# Patient Record
Sex: Female | Born: 1945
Health system: Southern US, Community
[De-identification: ages and names within clinical notes are randomized; demographics above are authoritative.]

## PROBLEM LIST (undated history)

## (undated) DIAGNOSIS — Z9289 Personal history of other medical treatment: Secondary | ICD-10-CM

## (undated) DIAGNOSIS — S62102A Fracture of unspecified carpal bone, left wrist, initial encounter for closed fracture: Secondary | ICD-10-CM

## (undated) DIAGNOSIS — J9600 Acute respiratory failure, unspecified whether with hypoxia or hypercapnia: Secondary | ICD-10-CM

## (undated) DIAGNOSIS — S272XXA Traumatic hemopneumothorax, initial encounter: Secondary | ICD-10-CM

## (undated) DIAGNOSIS — S36113A Laceration of liver, unspecified degree, initial encounter: Secondary | ICD-10-CM

## (undated) DIAGNOSIS — F0390 Unspecified dementia without behavioral disturbance: Secondary | ICD-10-CM

## (undated) DIAGNOSIS — N12 Tubulo-interstitial nephritis, not specified as acute or chronic: Secondary | ICD-10-CM

## (undated) DIAGNOSIS — D62 Acute posthemorrhagic anemia: Secondary | ICD-10-CM

## (undated) DIAGNOSIS — S064X9A Epidural hemorrhage with loss of consciousness of unspecified duration, initial encounter: Secondary | ICD-10-CM

## (undated) DIAGNOSIS — S2220XA Unspecified fracture of sternum, initial encounter for closed fracture: Secondary | ICD-10-CM

## (undated) DIAGNOSIS — S12200A Unspecified displaced fracture of third cervical vertebra, initial encounter for closed fracture: Secondary | ICD-10-CM

## (undated) DIAGNOSIS — S2241XA Multiple fractures of ribs, right side, initial encounter for closed fracture: Secondary | ICD-10-CM

## (undated) DIAGNOSIS — E876 Hypokalemia: Secondary | ICD-10-CM

## (undated) DIAGNOSIS — R279 Unspecified lack of coordination: Secondary | ICD-10-CM

## (undated) DIAGNOSIS — I4891 Unspecified atrial fibrillation: Secondary | ICD-10-CM

## (undated) DIAGNOSIS — R41841 Cognitive communication deficit: Secondary | ICD-10-CM

## (undated) DIAGNOSIS — G5790 Unspecified mononeuropathy of unspecified lower limb: Secondary | ICD-10-CM

## (undated) HISTORY — DX: Epidural hemorrhage with loss of consciousness of unspecified duration, initial encounter: S06.4X9A

## (undated) HISTORY — DX: Acute posthemorrhagic anemia: D62

## (undated) HISTORY — DX: Acute respiratory failure, unspecified whether with hypoxia or hypercapnia: J96.00

## (undated) HISTORY — DX: Fracture of unspecified carpal bone, left wrist, initial encounter for closed fracture: S62.102A

## (undated) HISTORY — DX: Hypokalemia: E87.6

## (undated) HISTORY — DX: Unspecified displaced fracture of third cervical vertebra, initial encounter for closed fracture: S12.200A

## (undated) HISTORY — PX: REPLACEMENT TOTAL KNEE: SUR1224

## (undated) HISTORY — DX: Laceration of liver, unspecified degree, initial encounter: S36.113A

## (undated) HISTORY — DX: Personal history of other medical treatment: Z92.89

## (undated) HISTORY — DX: Unspecified mononeuropathy of unspecified lower limb: G57.90

## (undated) HISTORY — PX: CHOLECYSTECTOMY: SHX55

## (undated) HISTORY — DX: Multiple fractures of ribs, right side, initial encounter for closed fracture: S22.41XA

## (undated) HISTORY — DX: Traumatic hemopneumothorax, initial encounter: S27.2XXA

## (undated) HISTORY — DX: Unspecified fracture of sternum, initial encounter for closed fracture: S22.20XA

---

## 1999-08-13 HISTORY — PX: CARDIAC CATHETERIZATION: SHX172

## 1999-11-23 ENCOUNTER — Encounter: Payer: Self-pay | Admitting: Emergency Medicine

## 1999-11-23 ENCOUNTER — Inpatient Hospital Stay (HOSPITAL_COMMUNITY): Admission: EM | Admit: 1999-11-23 | Discharge: 1999-11-26 | Payer: Self-pay | Admitting: Emergency Medicine

## 1999-12-04 ENCOUNTER — Encounter: Payer: Self-pay | Admitting: General Surgery

## 1999-12-04 ENCOUNTER — Encounter (INDEPENDENT_AMBULATORY_CARE_PROVIDER_SITE_OTHER): Payer: Self-pay | Admitting: Specialist

## 1999-12-04 ENCOUNTER — Ambulatory Visit (HOSPITAL_COMMUNITY): Admission: RE | Admit: 1999-12-04 | Discharge: 1999-12-05 | Payer: Self-pay | Admitting: General Surgery

## 2002-05-07 ENCOUNTER — Encounter: Payer: Self-pay | Admitting: Orthopedic Surgery

## 2002-05-07 ENCOUNTER — Encounter: Admission: RE | Admit: 2002-05-07 | Discharge: 2002-05-07 | Payer: Self-pay | Admitting: Orthopedic Surgery

## 2002-08-12 DIAGNOSIS — Z9289 Personal history of other medical treatment: Secondary | ICD-10-CM

## 2002-08-12 HISTORY — DX: Personal history of other medical treatment: Z92.89

## 2004-01-25 ENCOUNTER — Other Ambulatory Visit: Admission: RE | Admit: 2004-01-25 | Discharge: 2004-01-25 | Payer: Self-pay | Admitting: Family Medicine

## 2005-06-04 ENCOUNTER — Ambulatory Visit: Payer: Self-pay | Admitting: Internal Medicine

## 2005-08-12 DIAGNOSIS — Z9289 Personal history of other medical treatment: Secondary | ICD-10-CM

## 2005-08-12 HISTORY — DX: Personal history of other medical treatment: Z92.89

## 2005-08-12 HISTORY — PX: CARDIAC CATHETERIZATION: SHX172

## 2006-07-02 ENCOUNTER — Encounter: Admission: RE | Admit: 2006-07-02 | Discharge: 2006-07-02 | Payer: Self-pay | Admitting: Cardiology

## 2006-07-29 ENCOUNTER — Encounter: Admission: RE | Admit: 2006-07-29 | Discharge: 2006-07-29 | Payer: Self-pay | Admitting: Cardiology

## 2006-08-27 ENCOUNTER — Inpatient Hospital Stay (HOSPITAL_COMMUNITY): Admission: RE | Admit: 2006-08-27 | Discharge: 2006-08-30 | Payer: Self-pay | Admitting: Orthopedic Surgery

## 2007-04-06 ENCOUNTER — Ambulatory Visit: Payer: Self-pay | Admitting: Internal Medicine

## 2007-04-14 ENCOUNTER — Ambulatory Visit: Payer: Self-pay | Admitting: Internal Medicine

## 2007-06-03 ENCOUNTER — Inpatient Hospital Stay (HOSPITAL_COMMUNITY): Admission: RE | Admit: 2007-06-03 | Discharge: 2007-06-06 | Payer: Self-pay | Admitting: Orthopedic Surgery

## 2010-12-25 NOTE — Op Note (Signed)
[name], [name] NO.:  [id number]   MEDICAL RECORD NO.:  [id number]          PATIENT TYPE:  INP   LOCATION:  1613                         FACILITY:  [facility]   PHYSICIAN:  Ollen Gross, M.D.    DATE OF BIRTH:  04/01/1946   DATE OF PROCEDURE:  06/03/2007  DATE OF DISCHARGE:                               OPERATIVE REPORT   PREOPERATIVE DIAGNOSIS:  Osteoarthritis left knee.   POSTOPERATIVE DIAGNOSIS:  Osteoarthritis left knee.   PROCEDURE:  Left total knee arthroplasty.   SURGEON:  Ollen Gross, M.D.   ASSISTANT:  Avel Peace PA-C   ANESTHESIA:  Spinal.   ESTIMATED BLOOD LOSS:  Minimal.   DRAIN:  None.   TOURNIQUET TIME:  37 minutes at 300 mmHg.   COMPLICATIONS:  None.   CONDITION:  Stable to recovery.   BRIEF CLINICAL NOTE:  [name] is a 65 year old female end-stage arthritis  of left knee progressively worsening pain and dysfunction.  She had  successful right total knee arthroplasty presents now for left total  knee arthroplasty.   PROCEDURE IN DETAIL:  After successful administration of spinal  anesthetic, a tourniquet was placed high on the left thigh and left  lower extremity prepped and draped in usual sterile fashion.  Extremities wrapped in Esmarch, knee flexed and tourniquet inflated 300  mmHg.  Midline incision made with 10 blade through subcutaneous tissue  to the level of the extensor mechanism.  A fresh blade is used to make a  medial parapatellar arthrotomy.  Soft tissue of the proximal medial  tibia is subperiosteally elevated to the joint line with the knife and  into the semimembranosus bursa with a Cobb elevator.  Soft tissue  laterally is elevated with attention being paid to avoid the patellar  tendon on tibial tubercle.  Patella subluxed laterally, knee flexed 90  degrees, ACL and PCL removed.  Drill was used to create a starting hole  in the distal femur and canal was thoroughly irrigated.  The 5 degrees  left valgus  alignment guide is placed referencing posterior condyles,  rotations marked and the block pinned to remove 10 mm of the distal  femur.  Distal femoral resection is made with an oscillating saw.  Sizing blocks placed, size 3 is most appropriate.  Rotations marked at  the epicondylar axis.  Size three cutting block placed and the anterior,  posterior chamfer cuts made.   Tibia subluxed forward and menisci removed.  Extramedullary tibial  alignment guide is placed referencing proximally at the medial aspect of  the tibial tubercle distally along the second metatarsal axis tibial  crest.  Blocks pinned to remove 10 mL from nondeficient lateral side.  We had to go down an additional 2 mm to get to the base of the medial  defect.  Tibial resection is made with an oscillating saw.  Size 3 was  most appropriate tibial component and the proximal tibia prepared the  modular drill and keel punch for size 3.  Femoral preparation is  completed with the intercondylar cut.   Size 3 mobile bearing tibial trial,  size 3 posterior stabilized femoral  trial and 10 mm posterior stabilized rotating platform insert trial are  placed.  With a 10 there was a tiny bit of laxity, varus-valgus play  with a slight bit hyperextension.  With the 12/05 which allowed for full  extension we had excellent varus, valgus, anterior-posterior balance  throughout full range of motion.  Patella was everted and thickness  measured to be 23 mm.  Freehand resection taken to 14 mm, 38 template is  placed, lug holes were drilled, trial patella was placed and it tracks  normally.  Osteophytes were removed off the posterior femur with the  trial placed.  All trials removed and the cut bone surfaces are prepared  with pulsatile lavage.  Cement was mixed and once ready for  implantation, the size 3 mobile bearing tibial tray, size 3 posterior  stabilized femur and 38 patella are cemented into place and patella was  held the clamp.   Trial 12.5 inserts placed, knee held in full extension  and all extruded cement removed.  Cement fully hardened, then the  permanent 12.5 mm posterior stabilized rotating platform insert is  placed in the tibial tray.  Wound was copiously irrigated saline  solution and the FloSeal injected on the posterior capsule as well as  the medial and lateral gutters.  Moist sponge is placed and tourniquet  released total time of 37 minutes.  The sponge is held for about two  minutes, removed and then minimal bleeding encountered.  The bleeding  that was encountered stopped with electrocautery.  The extensor  mechanism was then closed with interrupted Vicryl, subcu closed with 2-0  Vicryl, subcuticular running 4-0 Monocryl.  Flexion against gravity was  130 degrees.  The incisions cleaned and dried and Steri-Strips and a  bulky sterile dressing applied.  She is placed into a knee immobilizer,  awakened and transferred to recovery in stable condition.      Ollen Gross, M.D.  Electronically Signed     FA/MEDQ  D:  06/03/2007  T:  06/04/2007  Job:  [id number]

## 2010-12-25 NOTE — H&P (Signed)
NAMESPIRIT, WERNLI NO.:  [id number]   MEDICAL RECORD NO.:  [id number]          PATIENT TYPE:  INP   LOCATION:  0004                         [facility]ACILITY:  [facility]   PHYSICIAN:  Ollen Gross, M.D.    DATE OF BIRTH:  1945/09/15   DATE OF ADMISSION:  06/03/2007  DATE OF DISCHARGE:                              HISTORY & PHYSICAL   CHIEF COMPLAINT:  Left knee pain.   HISTORY OF PRESENT ILLNESS:  The patient is a 65 year old female who has  been seen by Dr. Lequita Halt for ongoing left knee pain.  She has undergone  a right total knee back in January earlier this year and doing well with  that; but, the left knee continues to have pain.  She has known end-  stage arthritis and is at a point where she would like to proceed with  surgery.   ALLERGIES:  PENICILLIN causes a rash.  SURGICAL TAPE sensitivity and  ELECTRODE GEL sensitivity.   CURRENT MEDICATIONS:  Coumadin, Neurontin, atenolol, Cartia XT,  multivitamin, calcium plus D, COQ-10, vitamin C, ibuprofen, hydrocodone.   PAST MEDICAL HISTORY:  1. Bronchitis.  2. Paroxysmal atrial fibrillation.  3. Hiatal hernia.  4. History of renal calculi.  5. Postmenopausal.   PAST SURGICAL HISTORY:  1. Right total knee January 2000.  2. Gallbladder removal in April 2001.   SOCIAL HISTORY:  Married Optician, dispensing, retired Agricultural consultant.  Nonsmoker.  No  alcohol.  No children.  Spouse will be assisting with care after  surgery.   FAMILY HISTORY:  Father with history of hypertension and diabetes.  Maternal grandmother with diabetes.  Mother with lymphoma.   REVIEW OF SYSTEMS:  GENERAL:  No fevers, chills or night sweats.  NEUROLOGIC:  No seizures, syncope or paralysis.  RESPIRATORY:  No  shortness of breath, productive cough or hemoptysis.  CARDIOVASCULAR:  No chest pain, angina or orthopnea.  GI:  No nausea, vomiting, diarrhea  or constipation.  GU:  No dysuria, hematuria or discharge.  MUSCULOSKELETAL:  Left knee.   PHYSICAL  EXAMINATION:  VITAL SIGNS:  Pulse 56, respirations 12, blood  pressure 124/72.  GENERAL:  A 65 year old white female well-nourished, well-developed, in  no acute distress.  Alert, oriented and cooperative.  Excellent  historian.  Hip and thigh obesity noted.  HEENT:  Normocephalic, atraumatic.  Pupils are round and reactive.  Oropharynx clear.  EOMs intact.  NECK:  Supple.  CHEST:  Clear.  HEART:  Regular rate and rhythm with a 2/6 systolic ejection murmur,  best heard over aortic and pulmonic point.  ABDOMEN:  Soft, round, protuberant abdomen.  Bowel sounds present.  RECTAL/BREAST/GENITALIA:  Not done, not pertinent to present illness.  EXTREMITIES:  Left knee marked crepitus; tender more medial.  No  instability.   IMPRESSION:  1. Osteoarthritis of the left knee.  2. History of bronchitis.  3. Repair of __________  for atrial fibrillation.  4. Hiatal hernia.  5. History of renal calculi.  6. Postmenopausal.   PLAN:  The patient will be admitted to [facility] and undergo  a left total  knee replacement arthroplasty.  Surgery will performed by  Dr. Ollen Gross.      Alexzandrew L. Perkins, P.A.C.      Ollen Gross, M.D.  Electronically Signed    ALP/MEDQ  D:  06/02/2007  T:  06/03/2007  Job:  [id number]

## 2010-12-28 NOTE — Discharge Summary (Signed)
[name], [name]                  ACCOUNT NO.:  [id number]   MEDICAL RECORD NO.:  [id number]          PATIENT TYPE:  INP   LOCATION:  1613                         FACILITY:  [facility]   PHYSICIAN:  Ollen Gross, M.D.    DATE OF BIRTH:  1946-01-03   DATE OF ADMISSION:  06/03/2007  DATE OF DISCHARGE:  06/06/2007                               DISCHARGE SUMMARY   ADMITTING DIAGNOSIS:  1. Osteoarthritis, left knee.  2. History of bronchitis.  3. Paroxysmal atrial fibrillation.  4. Hiatal hernia.  5. History of renal calculi.  6. Postmenopausal.   DISCHARGE DIAGNOSIS:  1. Osteoarthritis left knee status post left total knee replacement      arthroplasty.  2. Mild postop hypokalemia improved.  3. History of bronchitis.  4. Paroxysmal atrial fibrillation.  5. Hiatal hernia.  6. History of renal calculi.  7. Postmenopausal.  8. Postop volume overload, improving.  9. Postop asymptomatic hypotension, improved.   PROCEDURE:  June 03, 2007 left total knee.  Surgeon Dr. Lequita Halt,  assistant Avel Peace, PA-C.   CONSULTATIONS:  None.   BRIEF HISTORY:  The patient is a 61-year female with end-stage arthritis  of the left knee and progressive worsening pain and dysfunction,  successful right total knee who now presents for left total knee.   LABORATORY DATA:  Preop CBC showed a hemoglobin of 13.5, hematocrit  39.3, white cell count 6.5, hemoglobin 12.6 postop, drifted down to 12.  Last noted H&H 11.0 and 32.0.  PT and PTT preop 13.9 at 26 respectively.  INR 1.1.  Serial pro times followed.  Last noted PT/INR 24.4 and 2.1.  Chem panel on admission all within normal limits.  Serial BMETs are  followed.  Potassium dropped from 4.2 down to 3.3 back up to 3.90.  Preop UA trace hemoglobin, 0 to 2 red cells, oxalate crystals, rare  epithelials, otherwise negative.  Blood group type O+.   EKG  June 17, 2006 normal sinus rhythm, rate 65. Marland Kitchen   HOSPITAL COURSE:  The patient admitted to  [facility],  tolerated procedure  well, later transferred to the recovery room on the  orthopedic floor and start on PCA and p.o. analgesics and standard  postop IV antibiotics. Therapy was started. Weightbearing as tolerated.  Had a fair amount of pain on the evening of surgery and the morning of  day one, doing a little bit better the next day.  Weaned over to p.o.  meds. Did have paroxysmal atrial fibrillation but her rate was  controlled on the atenolol, rate was in the 60s. Cardia blood pressure  matter was resumed per parameters. Hemoglobin looked good. A little bit  of elevated serum glucose but it was felt to be due to the dextrose in  the fluids.  She had a little bit of asymptomatic hypotension, systolic  was down in the 90s postop and was felt to be due to narcotics, so again  we held her blood pressure medications, gave her some gentle fluids.  Blood pressure was still  a little low the next day. She was noted  to  have positive volume overload from the surgical fluids and the fluids  for the asymptomatic hypotension. A little bit of diuresis and her  output improved.  She had a little bit of low potassium on day two, felt  to be delusional component, so we gave her some oral supplements. I  discontinued the PCA, dressing was changed, incision looked good.  She  started feeling better, got up and did a little bit more with therapy.  By day three she was doing better, her blood pressure had resolved.  She  was back up, her output was good, tolerating her meds and discharged  home.   DISCHARGE/PLAN:  1. The patient was discharged home on June 06, 2007.  2. Discharge diagnoses, please see above.  3. Discharge medications:  Percocet, Robaxin, and Coumadin.  4. Diet as tolerated.  5. Follow-up 2 weeks.   DISPOSITION:  Home.   CONDITION ON DISCHARGE:  Improving.      Alexzandrew L. Perkins, P.A.C.      Ollen Gross, M.D.  Electronically Signed     ALP/MEDQ  D:  07/21/2007  T:  07/21/2007  Job:  [id number]

## 2010-12-28 NOTE — H&P (Signed)
[name], [name] NO.:  [id number]   MEDICAL RECORD NO.:  [id number]          PATIENT TYPE:  INP   LOCATION:  NA                           FACILITY:  [facility]   PHYSICIAN:  Ollen Gross, M.D.    DATE OF BIRTH:  10/16/1945   DATE OF ADMISSION:  08/27/2006  DATE OF DISCHARGE:                              HISTORY & PHYSICAL   DATE OF OFFICE VISIT/HISTORY AND PHYSICAL:  August 15, 2006.   DATE OF ADMISSION:  August 27, 2006.   CHIEF COMPLAINT:  Right knee pain.   HISTORY OF PRESENT ILLNESS:  Patient is a 65 year old female who has  been seen by Dr. Despina Hick for progressively worsening problems concerning  the right knee.  She has had known end-stage arthritis and has been  treated conservatively in the past.  However, she has reached a point  where she would like to have something more permanent done.  She has  undergone injections in the past which have only provided temporary  relief.  It is felt that the patient would benefit from undergoing  surgical intervention.  She has undergone cardiac workup.  She ended up  having a positive stress test but eventually led to a cardiac  catheterization.  The cardiac catheterization showed normal left  ventricular systolic function and no coronary artery disease.  It  appeared that nuclear study was a false positive.  She has been cleared  by Dr. Clarene Duke and felt to be low risk for up and coming surgery.   ALLERGIES:  PENICILLIN.   CURRENT MEDICATIONS:  Atenolol, Cartia XT 240 mg, Coumadin, Flexeril,  multivitamin, calcium plus D, CoQ 10, vitamin C, ibuprofen, and Vicodin.   PAST MEDICAL HISTORY:  History of bronchitis, paroxysmal atrial  fibrillation, hiatal hernia, renal calculi, postmenopausal.   PAST SURGICAL HISTORY:  Gallbladder surgery, meniscal repair through an  arthroscopy, left hip pinning around age 62 or 16.   SOCIAL HISTORY:  Married.  A retired Optician, dispensing.  Nonsmoker.  No alcohol.  One child.  Spouse  and friends will be assisting with care after  surgery.   FAMILY HISTORY:  Father deceased, history of diabetes and arthritis.  Mother deceased, history of lymphoma.   REVIEW OF SYSTEMS:  GENERAL:  No fevers, chills, night sweats.  NEURO:  No seizures, syncope, paralysis.  RESPIRATORY:  No shortness of breath,  productive cough, or hemoptysis.  CARDIOVASCULAR:  No chest pain,  angina, or orthopnea.  GI:  No nausea, vomiting, diarrhea, or  constipation.  GU:  No dysuria, hematuria, or discharge.  MUSCULOSKELETAL:  Right knee.   PHYSICAL EXAMINATION:  VITAL SIGNS:  Pulse 56, respirations 12, blood  pressure 118/78.  GENERAL:  A 65 year old white female, well-developed, well-nourished,  overweight.  Some hip and thigh obesity.  No acute distress.  She is  somewhat anxious at the time of exam.  HEENT:  Normocephalic and atraumatic.  Pupils are round and reactive.  Oropharynx is clear.  EOMs intact.  CHEST:  Clear anterior and posterior chest wall.  HEART:  Bradycardic rhythm with a pulse of 56, otherwise  regular rhythm,  split S1, normal S2.  No rubs, thrills, palpitations, or murmurs.  ABDOMEN:  Soft, round, protuberant.  Bowel sounds present.  RECTAL/BREASTS/GENITALIA:  Not done.  Not pertinent to the present  illness.  EXTREMITIES:  Right knee shows a range of motion of 0-115 degrees.  Marked crepitus is noted.  No instability.  Left knee:  Range of motion  of 0-125 degrees.  Marked crepitus is noted.  No instability.   IMPRESSION:  1. Osteoarthritis, bilateral knees, right greater than left.  2. History of bronchitis.  3. Paroxysmal atrial fibrillation.  4. Hiatal hernia.  5. History of renal calculi.  6. Postmenopausal.   PLAN:  Patient will be admitted to [facility] to undergo a  right total knee arthroplasty.  Her cardiologist is Dr. Clarene Duke, who will  be notified of the room number on admission and will be consulted as  needed for assistance with the patient  throughout the hospital course.      Alexzandrew L. Julien Girt, P.A.      Ollen Gross, M.D.  Electronically Signed    ALP/MEDQ  D:  08/26/2006  T:  08/27/2006  Job:  [id number]   cc:   Thereasa Solo. Little, M.D.  Fax: [phone]   Doris Cheadle. Foy Guadalajara, M.D.  Fax: [phone]

## 2010-12-28 NOTE — Discharge Summary (Signed)
[name], [name]                  ACCOUNT NO.:  [id number]   MEDICAL RECORD NO.:  [id number]          PATIENT TYPE:  INP   LOCATION:  1505                         FACILITY:  [facility]   PHYSICIAN:  Ollen Gross, M.D.    DATE OF BIRTH:  10-06-1945   DATE OF ADMISSION:  08/27/2006  DATE OF DISCHARGE:  08/30/2006                               DISCHARGE SUMMARY   ADMISSION DIAGNOSES:  1. Osteoarthritis, bilateral knees, right greater than left.  2. History of bronchitis.  3. Paroxysmal atrial fibrillation.  4. Hiatal hernia.  5. History of renal calculi.  6. Postmenopausal.   DISCHARGE DIAGNOSES:  1. Osteoarthritis, right knee, status post right total knee      arthroplasty.  2. Mild postoperative blood-loss anemia, did not require transfusion.  3. Postoperative hyponatremia.  4. Postoperative hypokalemia.  5. History of bronchitis.  6. Paroxysmal atrial fibrillation.  7. Hiatal hernia.  8. History of renal calculi.  9. Postmenopausal.   PROCEDURE:  On August 27, 2006, right total knee.   SURGEON:  Ollen Gross, M.D.   ASSISTANT:  Alexzandrew L. Perkins, P.A.-C.   TOURNIQUET TIME:  Forty-four minutes.   CONSULTS:  None.   BRIEF HISTORY:  Patient is a 65 year old female with severe end-stage  arthritis of the right knee, greater than left knee, with intractable  pain, extensive nonoperative management, including injection, now  presents for total knee arthroplasty.   LABORATORY DATA:  Preop CBC showed a hemoglobin of 14, hematocrit 43.8.  PT/PTT 25.1 and 43, respectively with an INR of 2.2.  Chem panel:  A  slightly elevated total bili of 1.8.  The remaining chem panel within  normal limits.  Preop UA negative.   Chest CT on July 29, 2006:  Somewhat prominent right pulmonary  artery, which accounts for the abnormality on chest x-ray.  No  pathological findings.  She had a cardiac perfusion stress test.  There  is evidence of mild-to-moderate ischemia in the mid  anterior and apical  anterior regions.  Post stress ejection fraction is 61%.  Intermediate  risk scan.   Echo on March 07, 2003, normal LV size and function.  LA dilated at 4.4  cm.  Mild MR.   Cardiac catheterization report:  Cardiac catheterization on June 17, 2006:  Normal left ventricular systolic function.  No coronary arterial  disease.   Serial CBCs were followed.  Hemoglobin did drop down to 11.7.  Last  noted H&H 10.3 and 29.9.  Serial pro times followed.  Repeat pro time  and INR was 13.3 and 1 preoperatively.  Serial pro times followed.  Last  noted PT/INR on postop 17.8 and 1.4.  Serial BMETs were followed.  Sodium did drop down to 134, back up to 137.  Potassium dropped down to  3.3.  Had stabilized at 3.3 prior to discharge.  Follow-up UA negative.   HOSPITAL COURSE:  Patient was admitted to [facility],  tolerated the procedure well, and was later transferred to the recovery  room and then to the orthopedic floor.  Started on PCA and p.o.  analgesics for pain control following surgery.  Actually doing pretty  well the night of surgery, was fairly comfortable.  PCA was discontinued  the next morning.  Was weaned over to p.o. meds.  Hemoglobin was down to  11.7.  Hemovac drain placed at the time of surgery was discontinued,  pulled without difficulty.  Started with physical therapy.  Getting up  out of bed.  By day #2, she was even doing better, starting to get up  and walk with physical therapy short distances.  Dressing was changed.  The incision looked good.  She had a little bit of drop in her potassium  to 3.3.  She was placed on potassium supplements.  Continue to progress  well.  By the following day of August 30, 2006, she was tolerating her  meds, progressing with physical therapy, no complaints.  Was discharged  home.   DISCHARGE PLAN:  1. Patient was discharged home on August 30, 2006.  2. Discharge diagnoses:  Please see above.  3.  Discharge meds:  Coumadin, Percocet, Robaxin.  She was also given      Lovenox for a couple of days since her INR was slowly coming up.  4. Followup:  Two weeks.  5. Activity:  She is weightbearing as tolerate to the right lower      extremity.  Home health PT, home health nursing for gait training,      ambulation, ADLs, total knee protocol.   DISPOSITION:  Home.   CONDITION ON DISCHARGE:  Improving.      Alexzandrew L. Julien Girt, P.A.      Ollen Gross, M.D.  Electronically Signed    ALP/MEDQ  D:  09/25/2006  T:  09/25/2006  Job:  [id number]   cc:   Molly Maduro L. Foy Guadalajara, M.D.  Fax: [phone]   Thereasa Solo. Little, M.D.  Fax: [phone]

## 2010-12-28 NOTE — Discharge Summary (Signed)
[place]. [facility]  Patient:    [name], [name]                           MRN: [id number] Adm. Date:  [id number] Disc. Date: 11/26/99 Attending:  Cala Bradford CC:         Stacie Acres. Cliffton Asters, M.D.             Thereasa Solo. Little, M.D.             Timothy E. Earlene Plater, M.D.                           Discharge Summary  DATE OF BIRTH:  1946/03/23  DISCHARGE DIAGNOSES: 1. Chest pain.    a. Negative cardiac catheterization.    b. Probable gallbladder disease.       I. Ultrasound (November 23, 1999):  Small stones, no dilated duct. 2. Mildly elevated cardiac enzymes.    a. Significance doubtful. 3. Tachy arrhythmia.    a. History of supraventricular tachycardia/atrial fibrillation per patient.    b. Question ventricular tachycardia, on monitor.    c. Therapy with beta blocker. 4. Obesity. 5. L5-S1 herniated disk, treated medically. 6. Left hip surgery x 2, age 34. 7. Allergy:  PENICILLIN (rash) and CODEINE.  DISCHARGE MEDICATIONS: 1. Protonix 40 mg q.d. (new). 2. Toprol XL 50 mg 1/2 tablet q.a.m. (new). 3. Vicodin 5/500 1-2 q.4h. p.r.n. pain (new)--dispensed #20, no refills.  *Avoid aspirin or ibuprofen-like products.  CONDITION ON DISCHARGE:  Stable.  RECOMMENDED ACTIVITY:  No strenuous activity for 48 hours.  RECOMMENDED DIET:  Low fat.  SPECIAL INSTRUCTIONS: 1. Contact Dr. Clarene Duke if persistent palpitations, fainting spells, or problems    with the catheterization site. 2. Return to the emergency room if another episode of severe pain occurs.  FOLLOW-UP: 1. Dr. Kendrick Ranch, surgeon, on Friday, November 30, 1999, at 12:45.  Outpatient    evaluation for cholecystectomy. 2. Dr. Laurann Montana on Monday, Dec 24, 1999 at 9 oclock.  Assume primary    care responsibilities. 3. Contact Dr. Julieanne Manson, cardiologist, to schedule a follow-up in    approximately two weeks.  CONSULTATIONS:  Dr. Julieanne Manson.  PROCEDURES: 1. EKG (November 23, 1999):   Normal. 2. Chest x-ray (November 23, 1999):  Heart size and vascularity normal.  Lungs    clear.  Degenerative spurs in the thoracic spine. 3. Abdominal ultrasound (November 23, 1999):  Multiple small stones in the    gallbladder.  Gallbladder wall is not thickened nor distended.  The common    bile duct is 5.5 mm.  The liver parenchyma, pancreas, spleen, and kidneys    appear normal. 4. Cardiac catheterization (November 26, 1999):  No coronary artery disease.    Normal systolic function.  EF 70%.  HOSPITAL COURSE: #1 - CHEST PAIN:  Mrs. [name] is a 65 year old heavy-set female nonsmoker who presents with acute onset of substernal squeezing chest pain radiating to her left arm.  She had associated shortness of breath and nausea.  Two weeks prior, she had an episode of severe midepigastric pain after eating a large meal.  She has a prior history of atrial fibrillation (two years ago), but never followed through with a cardiac evaluation.  She is admitted with an initial troponin-I elevated at 0.12 and a total CK of 104 with an MB fraction of 6.1 (5.9%).  She was started on  heparin and nitroglycerin.  Serial cardiac enzymes demonstrated further increasing in these levels.  Her troponin-I peaked at 0.34 and came down to 0.29.  Her total CK did not change much and her relative index went to 6.5% and then 5.3%.  Dr. Julieanne Manson was consulted, given the patients presentation and positive cardiac enzymes.  He performed a cardiac catheterization, which demonstrated no coronary artery disease.  He felt that there was no coronary disease to explain her pain and felt that it was probably from gallbladder disease.  He doubted the significance of the low-positive troponin-Is and CK-MBs.  Her EKG remained normal.  She did have what seemed to appear as nonsustained ventricular tachycardia on the monitor and for this reason Dr. Clarene Duke placed her on a beta blocker.  She does have a history of palpitations and  atrial fibrillation per her report.  The LV function was normal.  She will follow up with Dr. Clarene Duke in approximately two weeks.  #2 - GALLSTONES:  Given the normal cardiac catheterization, it is felt that [name] chest pain is likely due to gallbladder disease.  She does have small stones documented by ultrasound this admission.  She had no evidence of enzyme elevations with laboratory work nor did she have a dilated duct or a thickened or distended gallbladder.  We opted to refer her to Dr. Kendrick Ranch as an outpatient for consideration for laparoscopic cholecystectomy.  I did also opt to treat the patient with a proton pump inhibitor, given the fact that her symptoms could also be related to reflux.  She is instructed to return if she has another attack.  I did give her some Vicodin to use at home should she need it.  Dr. Clarene Duke felt that she was a low cardiac risk for gallbladder surgery and would be available if needed.  #3 - OBESITY:  Mrs. [name] weighs 256 pounds.  Dr. Clarene Duke encouraged exercise and diet to help with weight control.  She will follow up with Dr. Cliffton Asters as well and hopefully begin a regular exercise program.  #4 - TACHY ARRHYTHMIA:  See chest pain above.  #5 - LOW-POSITIVE TROPONIN-I AND CK-MB:  See above.  DISCHARGE LABORATORY DATA:  Hemoccult negative.  Total cholesterol 190, triglycerides 177, HDL 40, LDL 115.  Hemoglobin 11.2, WBC 8300, platelet count 214.  ADDENDUM: 1. Mrs. [name] had a normal hemoglobin of 12.1 on admission.  It did drop to    11.2.  She was guaiac negative.  Recommend repeating in Dr. Foye Spurling office    in one month. 2. A TSH was not ordered here in the hospital.  If this has not been done    recently as an outpatient, would recommend obtaining. DD:  11/26/99 TD:  11/26/99 Job: [id number]

## 2010-12-28 NOTE — H&P (Signed)
[place]. [facility]  Patient:    [name], [name]                        MRN: [id number] Proc. Date: 11/23/99 Adm. Date:  11/23/99 Attending:  Stacie Acres. Cliffton Asters, M.D. CC:         [name], M.D.             Thereasa Solo. Little, M.D.                         History and Physical  CHIEF COMPLAINT:  Chest pain.  HISTORY OF PRESENT ILLNESS:  The patient is a 65 year old female, previously healthy, presents with acute substernal squeezing chest pain radiating into her  left arm, onset 1 a.m. last night until 5 a.m.  She did have associated shortness of breath with nausea.  No diaphoresis.  The patient took aspirin and Di-Gel without relief.  Final resolved, post massage.  The patient was at rest when the pain began.  This morning the patient presented to the emergency department and was pain-free upon presentation here and has been pain-free while in the emergency department.  Last threshold check several years ago was normal.  She does ride  bike 15 minutes daily without pain.  She has had orthopnea to the point where she sleeps in the recliner over the past year.  Two weeks ago she had a episode of severe mid epigastric pain after eating a large meal, which lasted approximately four hours.  She has a prior history of atrial fibrillation, did not have any feelings that was happening last night.  PAST MEDICAL HISTORY:  Atrial fibrillation two years ago, never followed through with cardiac evaluation.  PACs.  Obesity.  L5-S1 herniated disk which was treated medically.  PAST SURGICAL HISTORY:  Left hip surgery x 2, age 89. ______ with what sounds like a possible disk location.  First placed a pin, a second removed a pin.  CURRENT MEDICATIONS:  No prescription medications.  She is taking over-the-counter coenzyme Q-10.  Glucosamine, vitamin E, multivitamin, ibuprofen p.r.n.  ALLERGIES:  PENICILLIN leads to a rash and question of an allergy to  CODEINE.  SOCIAL HISTORY:  Married.  Has four cats.  She did have a baby when she was a teenager but was given up for adoption.  She works as a Optician, dispensing at [employer]. Her husband is a Warden/ranger and denies tobacco, alcohol or drug use, currently. Did use some acid in the 1960s.  FAMILY HISTORY:  Diabetes mellitus in father and grandmother.  CHF in her father. Hypertension in her father.  Lymphoma in mother.  REVIEW OF SYSTEMS:  Menstrual cycle has become very irregular.  She has some right foot pain in the second toes, status post a fall down the stairs several weeks go. Uses reading glasses.  Otherwise negative in all system groups.  PHYSICAL EXAMINATION:  VITAL SIGNS:  Temperature 97.6, BP 156/80 to 146/71, pulse 80, respirations 24,  pulse oximetry 96% on room air.  GENERAL:  Well-developed, well-nourished, white female NAD, joking.  HEENT:  Tympanic membranes clear bilaterally.  Oropharynx clear.  Pupils equal,  round, and reactive to light.  NECK:  Supple.  No lymphadenopathy.  No thyromegaly.  CARDIAC:  Regular rate and rhythm without murmur, S3 or S4.  LUNGS:  Clear to auscultation.  BREASTS:  No masses palpated.  No discharge.  ABDOMEN:  Soft, nondistended, nontender.  Positive bowel sounds.  BREASTS/PELVIC:  Examination deferred.  The patient states she will have this done by Dr. Foy Guadalajara.  EXTREMITIES:  No edema, 2+ dorsalis pedis bilaterally.  NEUROLOGICAL:  Cranial nerves 2-12 intact.  Strength 5/5 ______ upper extremities and lower extremities.  Reflexes 2+ and symmetric.  Skin within normal limits.   LABORATORY DATA:  Hemoglobin 12.1, white count 7.7, platelets 253.  PT/PTT normal. Electrolytes normal.  LFT is normal.  CK 104, MB fracture 6.1, troponin I 0.12.   ECG:  Normal sinus rhythm, Q in aVL, otherwise negative.  Chest x-ray:  No acute disease.  ASSESSMENT:  A 65 year old female with cardiac risk factors of age, obesity  who  presents post an episode of acute chest pain with a normal ECG but borderline enzymes, and a story that is very concerning for underlying coronary artery disease.  Differential diagnoses would also include gastroesophageal reflux disease with esophageal spasm.  PLAN:  Admit to the telemetry bed, aspirin, oxygen, p.r.n. nitroglycerin.  Rule out MI with serial CK-MBs, troponin Is.  Consult Dr. Caprice Kluver.  May need a stress test or heart catheterization prior to discharge. DD:  11/23/99 TD:  11/23/99 Job: [id number]

## 2010-12-28 NOTE — Cardiovascular Report (Signed)
[place]. [facility]  Patient:    HILLERY, ZACHMAN                           MRN: [id number] Proc. Date: 11/26/99 Adm. Date:  [id number] Attending:  Cala Bradford CC:         Stacie Acres. Cliffton Asters, M.D.                        Cardiac Catheterization  PROCEDURES: 1. Left heart catheterization. 2. Selective right and left coronary arteriography. 3. Ventriculography in the right anterior oblique projection.  INDICATIONS FOR PROCEDURE:  Ms. [name] is a 65 year old female, who was admitted  through the emergency room with chest pain.  She has had palpitations for many years and had an episode of nonsustained ventricular tachycardia.  In addition o this her troponins were slightly elevated despite having a normal ECG and normal CKs.  As a result of this, the concern was that she may have had a subendocardial myocardial infarction, and because of this she is brought to the cardiac catheterization after obtaining informed consent.  DESCRIPTION OF PROCEDURE:  The patient was prepared and draped in the usual sterile fashion exposing the right groin.  Following local anesthetic with 1% Xylocaine, the Seldinger technique was employed and a 6 [place] introducer sheath was placed in the right femoral artery.  Selective right and left coronary arteriography and ventriculography in the RAO projection was performed.  The 6 French Judkins configuration catheters were used.  RESULTS: 1. Hemodynamic monitoring:  Central aortic pressure was 123/77.  Left    ventricular pressure was 125/19 with no significant aortic valve gradient noted    at the time of pullback. 2. Ventriculography:  Ventriculography in the RAO projection revealed    normal left ventricular systolic function.  Occasional PVCs were noted.    Ejection fraction calculated at 70%.  The end-diastolic pressure was 19.    Mitral regurgitation was only noted following the PVC.  CORONARY ARTERIOGRAPHY:  There was  no calcification noted on fluoroscopy. 1. Left main:  Normal. 2. LAD.  The LAD extended down to the apex of the heart and was somewhat tortuous.    She had a very large first diagonal branch that came off very proximally. The    ostiums were all free of disease.  The distal vessels were free of disease and    there was no coronary disease noted in the LAD diagonal system. 3. Optimal diagonal normal. 3. Circumflex:  The circumflex gave rise to three medium sized marginal vessels.    This entire system was free of disease. 4. Right coronary artery:  The right coronary artery was a dominant vessel    giving rise to the PDA and a small posterolateral branch.  This vessel was    free of disease.  CONCLUSIONS: 1. Normal left ventricular systolic function. 2. No evidence of coronary artery disease.  DISCUSSION:  There is no coronary disease to explain her chest pain and I suspect it is probably all related to her gallbladder disease.  Her low positive troponins with normal CKs, I doubt are of clinical significance in the setting of no occlusive disease.  I have never seen a positive troponin in the setting of gallbladder disease.  Her ECG is also normal.  She has a long history of palpitations and a nonsustained ventricular tachycardia, will be treated with beta blocker.  With normal  left ventricular systolic function, the risk of the nonsustained short bursts of VT is minimal.  At this point I will discharge the patient on Toprol XL 50 mg a half a tablet a  day.  Enteric-coated aspirin.  I have discussed exercise and diet with the patient and from a cardiac standpoint, she can safely undergo gallbladder surgery. DD:  11/26/99 TD:  11/26/99 Job: [id number]

## 2010-12-28 NOTE — Op Note (Signed)
[place]. [facility]  Patient:    [name], [name]                           MRN: [id number] Proc. Date: 12/04/99 Adm. Date:  [id number] Attending:  Carson Myrtle CC:         Thereasa Solo. Little, M.D.                           Operative Report  PREOPERATIVE DIAGNOSIS:  Acute and chronic cholecystolithiasis.  POSTOPERATIVE DIAGNOSIS:  Acute and chronic cholecystolithiasis.  OPERATION PERFORMED:  Laparoscopic cholecystectomy and operative cholangiogram.  SURGEON:  Timothy E. Earlene Plater, M.D.  ASSISTANT:  Donnie Coffin. Samuella Cota, M.D.  ANESTHESIA:  CRNA supervised Dr. Jacklynn Bue.  INDICATIONS FOR PROCEDURE:  [name] is an otherwise healthy 64 year old Caucasian female with risk factors of obesity who developed substernal and right upper quadrant pain last week, was hospitalized, worked up for cardiac disease with catheterization and other exams, no disease was found.  She was thought to have a normal cardiac work-up.  Ultrasound of the gallbladder did reveal multiple small stones.  Her liver function studies were normal.  The pain subsided, she visited me urgently and wished to proceed rather urgently with a laparoscopic cholecystectomy after it had been carefully explained to her in the office.  She is now on a weight reduction diet.  DESCRIPTION OF PROCEDURE:  The patient was brought to the operating room, placed supine.  General endotracheal anesthesia administered.  The abdomen was scrubbed, prepped and draped in the usual fashion.  Marcaine 0.5% with epinephrine was injected at all trocar sites prior to insertion.  An infraumbilical incision was made.  The fascia identified, opened vertically. The abdomen entered without complication.  Hasson catheter placed tied in place and the abdomen insufflated.  General peritoneoscopy was unrevealing. The gallbladder was thickened and contained few adhesions.  A second 10 mm trocar was placed in the midepigastrium.  Two 5  mm trocars in the right upper quadrant.  Appropriate instruments were inserted, the gallbladder was grasped and placed on tension and careful dissection at the base of the gallbladder revealed two rounded structures entering the gallbladder.  These were carefully and completely dissected out.  The anteriormost of these was the cystic artery which was triply clipped and divided.  The more posterior was a single cystic duct which was dissected out and clipped near the gallbladder, a small incision made in the cystic duct, a Riddick catheter inserted percutaneously and inserted into the open cystic duct.  It was clipped in place and then using C-arm fluoroscopy under direct visualization, diluted contrast was injected.  It flowed nice through a convoluted cystic duct into the common bile duct and through the ampulla of Vater into the duodenum without obstruction or abnormality.  Likewise, the dye filled the liver and the radicals were thought to be normal.  Specifically, there was no evidence of stone, leak or peculiar anatomy.  The catheter was removed.  The base of the cystic duct was doubly clipped. The cystic duct was cut and then the gallbladder was removed from the gallbladder bed.  Not abnormalities except a second posterior branch of the cystic artery which was doubly clipped and divided.  The gallbladder removed from the gallbladder bed without complication, incident or spillage. Gallbladder bed carefully cauterized and copiously irrigated and was found to have no bile leak and no bleeding.  The gallbladder was removed through the infraumbilical incision which was then tied snugly.  Irrigation carried out and then all instruments, irrigant, CO2 and trocars removed.  The skin incisions were closed with 3-0 Monocryl.  Sponge, instrument and sharp counts were correct.  The patient tolerated the procedure well, had been given Zofran and Toradol intraoperatively. DD:  12/04/99 TD:   12/04/99 Job: [id number]

## 2010-12-28 NOTE — Op Note (Signed)
[name], [name]                  ACCOUNT NO.:  [id number]   MEDICAL RECORD NO.:  [id number]          PATIENT TYPE:  INP   LOCATION:  NA                           FACILITY:  [facility]   PHYSICIAN:  Ollen Gross, M.D.    DATE OF BIRTH:  1946/06/15   DATE OF PROCEDURE:  08/27/2006  DATE OF DISCHARGE:                               OPERATIVE REPORT   PREOPERATIVE DIAGNOSIS:  Osteoarthritis right knee.   POSTOPERATIVE DIAGNOSIS:  Osteoarthritis right knee.   PROCEDURE:  Right total knee arthroplasty.   SURGEON:  Ollen Gross, M.D.   ASSISTANT:  Alexzandrew L. Julien Girt, P.A.   ANESTHESIA:  Spinal.   BLOOD LOSS:  Minimal.   DRAIN:  Hemovac x1.   TOURNIQUET TIME:  44 minutes at 300 mmHg.   COMPLICATIONS:  None.   CONDITION:  Stable to recovery room.   BRIEF CLINICAL NOTE:  [name] is a 65 year old female with severe end-  stage arthritis right greater left knee with intractable pain.  She has  had extensive nonoperative management including injections and presents  now for total knee arthroplasty.   PROCEDURE IN DETAIL:  After the successful administration of spinal  anesthetic, tourniquet was placed high on the right and right lower  extremity prepped and draped in usual sterile fashion.  Extremity was  wrapped in Esmarch, knee flexed, tourniquet inflated to 350 mmHg.  Midline incision is made with a 10 blade through subcutaneous tissue to  the level of the extensor mechanism.  Fresh blade is used to make a  medial parapatellar arthrotomy.  Soft tissue over the proximal medial  tibia subperiosteally elevated to the joint line with the knife and into  the semimembranosus bursa with a Cobb elevator.  Soft tissue laterally  is elevated with attention being paid to avoid a patellar tendon on  tibial tubercle.  Patella subluxed laterally, knee flexed 90 degrees,  ACL and PCL removed.  Drill was used create a starting hole and the  distal femur canal was thoroughly irrigated.  A 5  degree right valgus  alignment guide is placed and referencing off the posterior condyles  rotations marked and a block pinned to remove 10 mm off the distal  femur.  Distal femoral resection is made with an oscillating saw.  We  then subluxed the tibia anteriorly, removed the menisci and placed the  extramedullary tibial alignment guide.  The alignment was referenced  proximally at the medial aspect of the tibial tubercle and distally  along the second metatarsal axis and tibial crest.  Blocks pinned to  remove 10 mm off of the nondeficient lateral side.  Tibial resection is  made with an oscillating saw.  We then placed the spacer blocks in  extension and 10 mm at a perfect fit.   We again addressed the femur.  The sizing blocks placed and size 3 is  the most appropriate.  Rotations marked off the epicondylar axis of the  size 3 cutting block is placed.  The anterior, posterior and chamfer  cuts are then made.  The tibia is again subluxed forward and  then the  size 3 is the most appropriate tibial component.  The proximal tibia was  prepared with the modular drill and keel punch for a size 3 and then the  femoral preparation is completed with the intercondylar cut.   Size 3 mobile bearing tibial trial, size 3 posterior stabilized femoral  trial and a 10 mm posterior stabilized rotating platform insert trial  are placed.  With the 10, full extension is achieved with excellent  varus and valgus balance throughout full range of motion.  Patella is  then everted and thickness measured to 22 mm.  Freehand resection is  taken to 13 mm, 35 template placed, lug holes are drilled, trial patella  is placed and it tracks normally.  Osteophytes are removed off the  distal femur with the trial placed and all trials are removed.  The cut  bone surfaces are prepared with pulsatile lavage.  Cement is mixed and  once ready for implantation, the size 3 mobile bearing tibial tray, size  3 posterior  stabilized femur and 35 patella are cemented into place and  patella is held with the clamp.  Trial 10 mm insert is placed, knee held  in full extension and all extruded cement removed.  Once cement is fully  hardened, then the permanent 10 mm posterior stabilized rotating  platform insert is placed into the tibial tray.  The wound is then  copiously irrigated with saline solution and the extensor mechanism  closed over a Hemovac drain with interrupted #1 PDS.  Flexion against  gravity is 130 degrees.  Tourniquet is released at a total time of 44  minutes.  Subcu is then closed with interrupted 2-0 Vicryl, subcuticular  running 4-0 Monocryl.  The incision is clean and dry and Steri-Strips  and a bulky sterile dressing applied.  Drain is hooked to suction and  she is placed into a knee immobilizer, awakened and transported to  recovery in stable condition.      Ollen Gross, M.D.  Electronically Signed     FA/MEDQ  D:  08/27/2006  T:  08/27/2006  Job:  [id number]

## 2011-05-22 LAB — BASIC METABOLIC PANEL
BUN: 10
BUN: 10
BUN: 5 — ABNORMAL LOW
CO2: 26
CO2: 27
CO2: 27
Calcium: 8.8
Calcium: 8.8
Calcium: 8.8
Chloride: 106
Chloride: 107
Chloride: 107
Creatinine, Ser: 0.6
Creatinine, Ser: 0.62
Creatinine, Ser: 0.67
GFR calc Af Amer: >60
GFR calc Af Amer: >60
GFR calc Af Amer: >60
GFR calc non Af Amer: >60
GFR calc non Af Amer: >60
GFR calc non Af Amer: >60
Glucose, Bld: 108 — ABNORMAL HIGH
Glucose, Bld: 125 — ABNORMAL HIGH
Glucose, Bld: 169 — ABNORMAL HIGH
Potassium: 3.3 — ABNORMAL LOW
Potassium: 3.7
Potassium: 3.9
Sodium: 138
Sodium: 141
Sodium: 141

## 2011-05-22 LAB — CBC
HCT: 32 — ABNORMAL LOW
HCT: 35.4 — ABNORMAL LOW
HCT: 36.9
Hemoglobin: 11 — ABNORMAL LOW
Hemoglobin: 12
Hemoglobin: 12.6
MCHC: 33.9
MCHC: 34.2
MCHC: 34.5
MCV: 84.4
MCV: 84.6
MCV: 86
Platelets: 157
Platelets: 157
Platelets: 177
RBC: 3.78 — ABNORMAL LOW
RBC: 4.12
RBC: 4.38
RDW: 13.9
RDW: 14
RDW: 14.2 — ABNORMAL HIGH
WBC: 10.4
WBC: 12.3 — ABNORMAL HIGH
WBC: 9.7

## 2011-05-22 LAB — PROTIME-INR
INR: 1
INR: 1.1
INR: 1.3
INR: 2.1 — ABNORMAL HIGH
Prothrombin Time: 13.7
Prothrombin Time: 13.9
Prothrombin Time: 16.5 — ABNORMAL HIGH
Prothrombin Time: 24.4 — ABNORMAL HIGH

## 2011-05-22 LAB — TYPE AND SCREEN
ABO/RH(D): O POS
Antibody Screen: NEGATIVE

## 2011-05-22 LAB — APTT: aPTT: 26

## 2011-05-23 LAB — COMPREHENSIVE METABOLIC PANEL
ALT: 22
AST: 23
Albumin: 4
Alkaline Phosphatase: 85
BUN: 22
CO2: 24
Calcium: 9.3
Chloride: 106
Creatinine, Ser: 0.59
GFR calc Af Amer: >60
GFR calc non Af Amer: >60
Glucose, Bld: 103 — ABNORMAL HIGH
Potassium: 4.2
Sodium: 139
Total Bilirubin: 1.1
Total Protein: 8

## 2011-05-23 LAB — CBC
HCT: 39.3
Hemoglobin: 13.5
MCHC: 34.4
MCV: 84.4
Platelets: 237
RBC: 4.66
RDW: 13.9
WBC: 6.5

## 2011-05-23 LAB — URINALYSIS, ROUTINE W REFLEX MICROSCOPIC
Bilirubin Urine: NEGATIVE
Glucose, UA: NEGATIVE
Ketones, ur: NEGATIVE
Leukocytes, UA: NEGATIVE
Nitrite: NEGATIVE
Protein, ur: NEGATIVE
Specific Gravity, Urine: 1.03
Urobilinogen, UA: 0.2
pH: 5

## 2011-05-23 LAB — URINE MICROSCOPIC-ADD ON

## 2012-11-29 ENCOUNTER — Emergency Department (HOSPITAL_BASED_OUTPATIENT_CLINIC_OR_DEPARTMENT_OTHER): Payer: Medicare Other

## 2012-11-29 ENCOUNTER — Encounter (HOSPITAL_BASED_OUTPATIENT_CLINIC_OR_DEPARTMENT_OTHER): Payer: Self-pay | Admitting: *Deleted

## 2012-11-29 ENCOUNTER — Emergency Department (HOSPITAL_BASED_OUTPATIENT_CLINIC_OR_DEPARTMENT_OTHER)
Admission: EM | Admit: 2012-11-29 | Discharge: 2012-11-29 | Disposition: A | Discharge status: Home / Self Care | Payer: Medicare Other | Attending: Emergency Medicine | Admitting: Emergency Medicine

## 2012-11-29 DIAGNOSIS — Z7901 Long term (current) use of anticoagulants: Secondary | ICD-10-CM | POA: Insufficient documentation

## 2012-11-29 DIAGNOSIS — Y9229 Other specified public building as the place of occurrence of the external cause: Secondary | ICD-10-CM | POA: Insufficient documentation

## 2012-11-29 DIAGNOSIS — Y939 Activity, unspecified: Secondary | ICD-10-CM | POA: Insufficient documentation

## 2012-11-29 DIAGNOSIS — Z79899 Other long term (current) drug therapy: Secondary | ICD-10-CM | POA: Insufficient documentation

## 2012-11-29 DIAGNOSIS — I4891 Unspecified atrial fibrillation: Secondary | ICD-10-CM | POA: Insufficient documentation

## 2012-11-29 DIAGNOSIS — S022XXA Fracture of nasal bones, initial encounter for closed fracture: Secondary | ICD-10-CM | POA: Insufficient documentation

## 2012-11-29 DIAGNOSIS — W010XXA Fall on same level from slipping, tripping and stumbling without subsequent striking against object, initial encounter: Secondary | ICD-10-CM | POA: Insufficient documentation

## 2012-11-29 DIAGNOSIS — Z880 Allergy status to penicillin: Secondary | ICD-10-CM | POA: Insufficient documentation

## 2012-11-29 DIAGNOSIS — W19XXXA Unspecified fall, initial encounter: Secondary | ICD-10-CM

## 2012-11-29 DIAGNOSIS — S3981XA Other specified injuries of abdomen, initial encounter: Secondary | ICD-10-CM | POA: Insufficient documentation

## 2012-11-29 HISTORY — DX: Unspecified atrial fibrillation: I48.91

## 2012-11-29 LAB — PROTIME-INR
INR: 1.65 — ABNORMAL HIGH (ref 0.00–1.49)
Prothrombin Time: 19 seconds — ABNORMAL HIGH (ref 11.6–15.2)

## 2012-11-29 NOTE — ED Notes (Signed)
Pt was at church earlier this a.m. And tripped and fell. On Coumadin. Fell flat on face. Abrasions and swelling noted. Given ice pack. No LOC. PERL

## 2012-11-29 NOTE — ED Provider Notes (Signed)
History     CSN: [id number]  Arrival date & time 11/29/12  1310   First MD Initiated Contact with Patient 11/29/12 1407      Chief Complaint  Patient presents with  . Fall    (Consider location/radiation/quality/duration/timing/severity/associated sxs/prior treatment) HPI Comments: Patient is a 67 year old female with a past medical history of A-fib who presents after a fall that occurred prior to arrival. Patient reports tripping on the sidewalk, falling, and landing on her abdomen and face. Patient reports nose pain and swelling after the fall. She denies LOC. The fall was mechanical. She denies chest pain, dizziness, SOB prior to falling. The pain started suddenly and remained constant since the fall. Palpation makes the pain worse. Nothing makes the pain better. Patient has tried applying ice to the injury. No other associated symptoms. Patient denies any other injury.    Past Medical History  Diagnosis Date  . A-fib     Past Surgical History  Procedure Laterality Date  . Replacement total knee    . Cholecystectomy      History reviewed. No pertinent family history.  History  Substance Use Topics  . Smoking status: Never Smoker   . Smokeless tobacco: Not on file  . Alcohol Use: No    OB History   Grav Para Term Preterm Abortions TAB SAB Ect Mult Living                  Review of Systems  HENT: Positive for facial swelling.   Skin: Positive for wound.  All other systems reviewed and are negative.    Allergies  Penicillins  Home Medications   Current Outpatient Rx  Name  Route  Sig  Dispense  Refill  . atenolol-chlorthalidone (TENORETIC) 50-25 MG per tablet   Oral   Take 1 tablet by mouth daily.         Marland Kitchen diltiazem (TIAZAC) 240 MG 24 hr capsule   Oral   Take 240 mg by mouth daily.         Marland Kitchen warfarin (COUMADIN) 5 MG tablet   Oral   Take 5 mg by mouth daily.           BP 125/77  Pulse 60  Temp(Src) 98 F (36.7 C) (Oral)  Resp 20  Ht  5\' 7"  (1.702 m)  Wt 240 lb (108.863 kg)  BMI 37.58 kg/m2  SpO2 95%  Physical Exam  Nursing note and vitals reviewed. Constitutional: She is oriented to person, place, and time. She appears well-developed and well-nourished. No distress.  HENT:  Head: Normocephalic and atraumatic.  Hematoma noted to left forehead that is tender to palpation. Generalized edema and tenderness to palpation of nasal bridge.  Eyes: Conjunctivae and EOM are normal. Pupils are equal, round, and reactive to light.  Neck: Normal range of motion. Neck supple.  Cardiovascular: Normal rate and regular rhythm.  Exam reveals no gallop and no friction rub.   No murmur heard. Pulmonary/Chest: Effort normal and breath sounds normal. She has no wheezes. She has no rales. She exhibits no tenderness.  Abdominal: Soft. She exhibits no distension. There is no tenderness. There is no rebound and no guarding.  Musculoskeletal: Normal range of motion.  Neurological: She is alert and oriented to person, place, and time. No cranial nerve deficit. Coordination normal.  Speech is goal-oriented. Moves limbs without ataxia.   Skin: Skin is warm and dry.  Abrasions noted to face. No active bleeding. No open wound.  Psychiatric: She has a normal mood and affect. Her behavior is normal.    ED Course  Procedures (including critical care time)  Labs Reviewed  PROTIME-INR - Abnormal; Notable for the following:    Prothrombin Time 19.0 (*)    INR 1.65 (*)    All other components within normal limits   Ct Head Wo Contrast  11/29/2012  *RADIOLOGY REPORT*  Clinical Data:  67 year old female status post fall face down on pavement.  Known injury.  On Coumadin.  Pain.  History of atrial fibrillation.  CT HEAD WITHOUT CONTRAST CT MAXILLOFACIAL WITHOUT CONTRAST CT CERVICAL SPINE WITHOUT CONTRAST  Technique:  Multidetector CT imaging of the head, cervical spine, and maxillofacial structures were performed using the standard protocol without  intravenous contrast. Multiplanar CT image reconstructions of the cervical spine and maxillofacial structures were also generated.  Comparison:   None  CT HEAD  Findings: Face findings are below.  No scalp convexity hematoma.  Possible mild left forehead scalp contusion (series 3 image 29).  Underlying left frontal bone intact.  Calvarium intact.  Mastoids are clear.  Osteopenia.  Calcified atherosclerosis at the skull base.  Cerebral volume is within normal limits for age.  No midline shift, ventriculomegaly, mass effect, evidence of mass lesion, intracranial hemorrhage or evidence of cortically based acute infarction.  Gray-white matter differentiation is within normal limits throughout the brain.  No suspicious intracranial vascular hyperdensity.  IMPRESSION: 1. Small chronic right cerebellar lacunar infarct.  Otherwise negative noncontrast CT appearance of the brain. 2.  See face and cervical spine findings below. 3.  Mild left forehead scalp contusion without underlying fracture.  CT MAXILLOFACIAL  Findings:  Mildly displaced bilateral nasal bone fractures left greater than right.  Prominent associated bridge of nose soft tissue hematoma.  Globes appear intact and orbital soft tissues do not appear affected.  Visualized paranasal sinuses and mastoids are clear.  Mandible intact.  No other acute facial fracture identified.  IMPRESSION: 1.  Left greater than right nasal bone fractures with prominent associated bridge of nose soft tissue hematoma. 2.  No other acute facial fracture. 3.  Cervical spine findings are below.  CT CERVICAL SPINE  Findings:   Visualized skull base is intact.  No atlanto-occipital dissociation.  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  Incidental scalloping of the left lateral C4 vertebral body due to a tortuous dominant left vertebral artery.  2-3 mm of anterolisthesis of C4 on C5 associated with moderate to severe left side facet degeneration.   Chronic C5-C6 and C6-C7 disc space loss and, bulky endplate spurring. Bilateral posterior element alignment is within normal limits. Cervicothoracic junction alignment is within normal limits.  No acute cervical fracture identified.  Negative lung apices.  The partially retropharyngeal course of both carotid arteries.  On these images there is a small chronic lacunar infarct evident in the right cerebellum (series 11 image 5).  This was thought to reflect a sulcus on the above head images.  IMPRESSION: 1. No acute fracture or listhesis identified in the cervical spine. Ligamentous injury is not excluded. 2.  C3-C4 posterior element ankylosis.  Degenerative C4-C5 spondylolisthesis.  Chronic advanced C5-C6 and C6-C7 disc and endplate degeneration.   Original Report Authenticated By: Erskine Speed, M.D.    Ct Cervical Spine Wo Contrast  11/29/2012  *RADIOLOGY REPORT*  Clinical Data:  67 year old female status post fall face down on pavement.  Known injury.  On Coumadin.  Pain.  History of atrial  fibrillation.  CT HEAD WITHOUT CONTRAST CT MAXILLOFACIAL WITHOUT CONTRAST CT CERVICAL SPINE WITHOUT CONTRAST  Technique:  Multidetector CT imaging of the head, cervical spine, and maxillofacial structures were performed using the standard protocol without intravenous contrast. Multiplanar CT image reconstructions of the cervical spine and maxillofacial structures were also generated.  Comparison:   None  CT HEAD  Findings: Face findings are below.  No scalp convexity hematoma.  Possible mild left forehead scalp contusion (series 3 image 29).  Underlying left frontal bone intact.  Calvarium intact.  Mastoids are clear.  Osteopenia.  Calcified atherosclerosis at the skull base.  Cerebral volume is within normal limits for age.  No midline shift, ventriculomegaly, mass effect, evidence of mass lesion, intracranial hemorrhage or evidence of cortically based acute infarction.  Gray-white matter differentiation is within normal  limits throughout the brain.  No suspicious intracranial vascular hyperdensity.  IMPRESSION: 1. Small chronic right cerebellar lacunar infarct.  Otherwise negative noncontrast CT appearance of the brain. 2.  See face and cervical spine findings below. 3.  Mild left forehead scalp contusion without underlying fracture.  CT MAXILLOFACIAL  Findings:  Mildly displaced bilateral nasal bone fractures left greater than right.  Prominent associated bridge of nose soft tissue hematoma.  Globes appear intact and orbital soft tissues do not appear affected.  Visualized paranasal sinuses and mastoids are clear.  Mandible intact.  No other acute facial fracture identified.  IMPRESSION: 1.  Left greater than right nasal bone fractures with prominent associated bridge of nose soft tissue hematoma. 2.  No other acute facial fracture. 3.  Cervical spine findings are below.  CT CERVICAL SPINE  Findings:   Visualized skull base is intact.  No atlanto-occipital dissociation.  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  Incidental scalloping of the left lateral C4 vertebral body due to a tortuous dominant left vertebral artery.  2-3 mm of anterolisthesis of C4 on C5 associated with moderate to severe left side facet degeneration.  Chronic C5-C6 and C6-C7 disc space loss and, bulky endplate spurring. Bilateral posterior element alignment is within normal limits. Cervicothoracic junction alignment is within normal limits.  No acute cervical fracture identified.  Negative lung apices.  The partially retropharyngeal course of both carotid arteries.  On these images there is a small chronic lacunar infarct evident in the right cerebellum (series 11 image 5).  This was thought to reflect a sulcus on the above head images.  IMPRESSION: 1. No acute fracture or listhesis identified in the cervical spine. Ligamentous injury is not excluded. 2.  C3-C4 posterior element ankylosis.  Degenerative C4-C5  spondylolisthesis.  Chronic advanced C5-C6 and C6-C7 disc and endplate degeneration.   Original Report Authenticated By: Erskine Speed, M.D.    Ct Maxillofacial Wo Cm  11/29/2012  *RADIOLOGY REPORT*  Clinical Data:  67 year old female status post fall face down on pavement.  Known injury.  On Coumadin.  Pain.  History of atrial fibrillation.  CT HEAD WITHOUT CONTRAST CT MAXILLOFACIAL WITHOUT CONTRAST CT CERVICAL SPINE WITHOUT CONTRAST  Technique:  Multidetector CT imaging of the head, cervical spine, and maxillofacial structures were performed using the standard protocol without intravenous contrast. Multiplanar CT image reconstructions of the cervical spine and maxillofacial structures were also generated.  Comparison:   None  CT HEAD  Findings: Face findings are below.  No scalp convexity hematoma.  Possible mild left forehead scalp contusion (series 3 image 29).  Underlying left frontal bone intact.  Calvarium intact.  Mastoids  are clear.  Osteopenia.  Calcified atherosclerosis at the skull base.  Cerebral volume is within normal limits for age.  No midline shift, ventriculomegaly, mass effect, evidence of mass lesion, intracranial hemorrhage or evidence of cortically based acute infarction.  Gray-white matter differentiation is within normal limits throughout the brain.  No suspicious intracranial vascular hyperdensity.  IMPRESSION: 1. Small chronic right cerebellar lacunar infarct.  Otherwise negative noncontrast CT appearance of the brain. 2.  See face and cervical spine findings below. 3.  Mild left forehead scalp contusion without underlying fracture.  CT MAXILLOFACIAL  Findings:  Mildly displaced bilateral nasal bone fractures left greater than right.  Prominent associated bridge of nose soft tissue hematoma.  Globes appear intact and orbital soft tissues do not appear affected.  Visualized paranasal sinuses and mastoids are clear.  Mandible intact.  No other acute facial fracture identified.  IMPRESSION:  1.  Left greater than right nasal bone fractures with prominent associated bridge of nose soft tissue hematoma. 2.  No other acute facial fracture. 3.  Cervical spine findings are below.  CT CERVICAL SPINE  Findings:   Visualized skull base is intact.  No atlanto-occipital dissociation.  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  Incidental scalloping of the left lateral C4 vertebral body due to a tortuous dominant left vertebral artery.  2-3 mm of anterolisthesis of C4 on C5 associated with moderate to severe left side facet degeneration.  Chronic C5-C6 and C6-C7 disc space loss and, bulky endplate spurring. Bilateral posterior element alignment is within normal limits. Cervicothoracic junction alignment is within normal limits.  No acute cervical fracture identified.  Negative lung apices.  The partially retropharyngeal course of both carotid arteries.  On these images there is a small chronic lacunar infarct evident in the right cerebellum (series 11 image 5).  This was thought to reflect a sulcus on the above head images.  IMPRESSION: 1. No acute fracture or listhesis identified in the cervical spine. Ligamentous injury is not excluded. 2.  C3-C4 posterior element ankylosis.  Degenerative C4-C5 spondylolisthesis.  Chronic advanced C5-C6 and C6-C7 disc and endplate degeneration.   Original Report Authenticated By: Erskine Speed, M.D.      1. Nasal bone fracture, closed, initial encounter   2. Fall, initial encounter       MDM  4:38 PM CT head, face, and cervical spine show nasal bone fracture. Patient will have follow up with ENT for further evaluation of fracture. Patient instructed to ice her nose. Patient declined pain medication at this time. No other injuries. Patient shows no neuro deficits and is able to converse easily. She is alert and oriented. Patient will follow up with her PCP tomorrow. Patient instructed to return with worsening or concerning symptoms.         Emilia Beck, PA-C 11/29/12 1651

## 2012-11-30 NOTE — ED Provider Notes (Signed)
Medical screening examination/treatment/procedure(s) were performed by non-physician practitioner and as supervising physician I was immediately available for consultation/collaboration.   Carleene Cooper III, MD 11/30/12 [phone]

## 2013-08-01 ENCOUNTER — Emergency Department (HOSPITAL_COMMUNITY): Payer: No Typology Code available for payment source

## 2013-08-01 ENCOUNTER — Encounter (HOSPITAL_COMMUNITY): Payer: Self-pay | Admitting: Emergency Medicine

## 2013-08-01 ENCOUNTER — Inpatient Hospital Stay (HOSPITAL_COMMUNITY)
Admission: EM | Admit: 2013-08-01 | Discharge: 2013-08-16 | DRG: 958 | Disposition: A | Discharge status: Skilled Nursing Facility | Payer: No Typology Code available for payment source | Attending: General Surgery | Admitting: General Surgery

## 2013-08-01 DIAGNOSIS — IMO0002 Reserved for concepts with insufficient information to code with codable children: Secondary | ICD-10-CM | POA: Diagnosis present

## 2013-08-01 DIAGNOSIS — I4891 Unspecified atrial fibrillation: Secondary | ICD-10-CM

## 2013-08-01 DIAGNOSIS — S064X9A Epidural hemorrhage with loss of consciousness of unspecified duration, initial encounter: Secondary | ICD-10-CM

## 2013-08-01 DIAGNOSIS — S42109A Fracture of unspecified part of scapula, unspecified shoulder, initial encounter for closed fracture: Secondary | ICD-10-CM | POA: Diagnosis present

## 2013-08-01 DIAGNOSIS — S300XXA Contusion of lower back and pelvis, initial encounter: Secondary | ICD-10-CM | POA: Diagnosis present

## 2013-08-01 DIAGNOSIS — D696 Thrombocytopenia, unspecified: Secondary | ICD-10-CM | POA: Diagnosis not present

## 2013-08-01 DIAGNOSIS — S62102A Fracture of unspecified carpal bone, left wrist, initial encounter for closed fracture: Secondary | ICD-10-CM

## 2013-08-01 DIAGNOSIS — S2220XA Unspecified fracture of sternum, initial encounter for closed fracture: Secondary | ICD-10-CM

## 2013-08-01 DIAGNOSIS — I480 Paroxysmal atrial fibrillation: Secondary | ICD-10-CM

## 2013-08-01 DIAGNOSIS — S064XAA Epidural hemorrhage with loss of consciousness status unknown, initial encounter: Secondary | ICD-10-CM

## 2013-08-01 DIAGNOSIS — Z96659 Presence of unspecified artificial knee joint: Secondary | ICD-10-CM

## 2013-08-01 DIAGNOSIS — S2249XA Multiple fractures of ribs, unspecified side, initial encounter for closed fracture: Secondary | ICD-10-CM | POA: Diagnosis present

## 2013-08-01 DIAGNOSIS — S12200A Unspecified displaced fracture of third cervical vertebra, initial encounter for closed fracture: Secondary | ICD-10-CM

## 2013-08-01 DIAGNOSIS — S52502A Unspecified fracture of the lower end of left radius, initial encounter for closed fracture: Secondary | ICD-10-CM

## 2013-08-01 DIAGNOSIS — G5790 Unspecified mononeuropathy of unspecified lower limb: Secondary | ICD-10-CM | POA: Diagnosis present

## 2013-08-01 DIAGNOSIS — D62 Acute posthemorrhagic anemia: Secondary | ICD-10-CM | POA: Diagnosis not present

## 2013-08-01 DIAGNOSIS — J939 Pneumothorax, unspecified: Secondary | ICD-10-CM

## 2013-08-01 DIAGNOSIS — S064X0A Epidural hemorrhage without loss of consciousness, initial encounter: Secondary | ICD-10-CM

## 2013-08-01 DIAGNOSIS — E876 Hypokalemia: Secondary | ICD-10-CM | POA: Diagnosis not present

## 2013-08-01 DIAGNOSIS — J4489 Other specified chronic obstructive pulmonary disease: Secondary | ICD-10-CM | POA: Diagnosis present

## 2013-08-01 DIAGNOSIS — S129XXA Fracture of neck, unspecified, initial encounter: Secondary | ICD-10-CM

## 2013-08-01 DIAGNOSIS — Z6841 Body Mass Index (BMI) 40.0 and over, adult: Secondary | ICD-10-CM

## 2013-08-01 DIAGNOSIS — S2231XA Fracture of one rib, right side, initial encounter for closed fracture: Secondary | ICD-10-CM

## 2013-08-01 DIAGNOSIS — S36113A Laceration of liver, unspecified degree, initial encounter: Secondary | ICD-10-CM | POA: Diagnosis present

## 2013-08-01 DIAGNOSIS — S52509A Unspecified fracture of the lower end of unspecified radius, initial encounter for closed fracture: Principal | ICD-10-CM | POA: Diagnosis present

## 2013-08-01 DIAGNOSIS — S272XXA Traumatic hemopneumothorax, initial encounter: Secondary | ICD-10-CM

## 2013-08-01 DIAGNOSIS — Z79899 Other long term (current) drug therapy: Secondary | ICD-10-CM

## 2013-08-01 DIAGNOSIS — S270XXA Traumatic pneumothorax, initial encounter: Secondary | ICD-10-CM

## 2013-08-01 DIAGNOSIS — S2241XA Multiple fractures of ribs, right side, initial encounter for closed fracture: Secondary | ICD-10-CM

## 2013-08-01 DIAGNOSIS — S27329A Contusion of lung, unspecified, initial encounter: Secondary | ICD-10-CM | POA: Diagnosis present

## 2013-08-01 DIAGNOSIS — J9600 Acute respiratory failure, unspecified whether with hypoxia or hypercapnia: Secondary | ICD-10-CM | POA: Diagnosis not present

## 2013-08-01 DIAGNOSIS — J449 Chronic obstructive pulmonary disease, unspecified: Secondary | ICD-10-CM | POA: Diagnosis present

## 2013-08-01 DIAGNOSIS — Z7901 Long term (current) use of anticoagulants: Secondary | ICD-10-CM

## 2013-08-01 LAB — URINALYSIS, ROUTINE W REFLEX MICROSCOPIC
Bilirubin Urine: NEGATIVE
Glucose, UA: NEGATIVE mg/dL
Ketones, ur: NEGATIVE mg/dL
Leukocytes, UA: NEGATIVE
Nitrite: NEGATIVE
Protein, ur: 30 mg/dL — AB
Specific Gravity, Urine: >1.046 — ABNORMAL HIGH (ref 1.005–1.030)
Urobilinogen, UA: 0.2 mg/dL (ref 0.0–1.0)
pH: 5 (ref 5.0–8.0)

## 2013-08-01 LAB — CBC
HCT: 41.6 % (ref 36.0–46.0)
Hemoglobin: 13.7 g/dL (ref 12.0–15.0)
MCH: 29.6 pg (ref 26.0–34.0)
MCHC: 32.9 g/dL (ref 30.0–36.0)
MCV: 89.8 fL (ref 78.0–100.0)
Platelets: 203 10*3/uL (ref 150–400)
RBC: 4.63 MIL/uL (ref 3.87–5.11)
RDW: 14.5 % (ref 11.5–15.5)
WBC: 14.6 10*3/uL — ABNORMAL HIGH (ref 4.0–10.5)

## 2013-08-01 LAB — BASIC METABOLIC PANEL
BUN: 21 mg/dL (ref 6–23)
CO2: 22 mEq/L (ref 19–32)
Calcium: 8.4 mg/dL (ref 8.4–10.5)
Chloride: 106 mEq/L (ref 96–112)
Creatinine, Ser: 0.65 mg/dL (ref 0.50–1.10)
GFR calc Af Amer: >90 mL/min (ref >90)
GFR calc non Af Amer: 90 mL/min — ABNORMAL LOW (ref >90)
Glucose, Bld: 142 mg/dL — ABNORMAL HIGH (ref 70–99)
Potassium: 4.1 mEq/L (ref 3.5–5.1)
Sodium: 140 mEq/L (ref 135–145)

## 2013-08-01 LAB — MRSA PCR SCREENING: MRSA by PCR: NEGATIVE

## 2013-08-01 LAB — URINE MICROSCOPIC-ADD ON

## 2013-08-01 LAB — PROTIME-INR
INR: 2.53 — ABNORMAL HIGH (ref 0.00–1.49)
Prothrombin Time: 26.4 seconds — ABNORMAL HIGH (ref 11.6–15.2)

## 2013-08-01 LAB — TROPONIN I: Troponin I: <0.3 ng/mL (ref <0.30)

## 2013-08-01 LAB — ABO/RH: ABO/RH(D): O POS

## 2013-08-01 LAB — APTT: aPTT: 29 seconds (ref 24–37)

## 2013-08-01 MED ORDER — HYDROMORPHONE 0.3 MG/ML IV SOLN
INTRAVENOUS | Status: DC
  Administered 2013-08-01: 18:00:00 via INTRAVENOUS
  Administered 2013-08-02: 1.99 mg via INTRAVENOUS
  Administered 2013-08-02 (×2): 1.8 mg via INTRAVENOUS
  Administered 2013-08-02: 1.2 mg via INTRAVENOUS
  Administered 2013-08-03: 05:00:00 via INTRAVENOUS
  Administered 2013-08-03: 2.4 mg via INTRAVENOUS
  Filled 2013-08-01 (×3): qty 25

## 2013-08-01 MED ORDER — NALOXONE HCL 0.4 MG/ML IJ SOLN: 0.4000 mg | INTRAMUSCULAR | Status: DC | PRN

## 2013-08-01 MED ORDER — SODIUM CHLORIDE 0.9 % IV BOLUS (SEPSIS)
1000.0000 mL | Freq: Once | INTRAVENOUS | Status: AC
  Administered 2013-08-01: 1000 mL via INTRAVENOUS

## 2013-08-01 MED ORDER — ONDANSETRON HCL 4 MG/2ML IJ SOLN
4.0000 mg | Freq: Once | INTRAMUSCULAR | Status: AC
  Administered 2013-08-01: 4 mg via INTRAVENOUS
  Filled 2013-08-01: qty 2

## 2013-08-01 MED ORDER — PANTOPRAZOLE SODIUM 40 MG PO TBEC
40.0000 mg | DELAYED_RELEASE_TABLET | Freq: Every day | ORAL | Status: DC
  Administered 2013-08-05 – 2013-08-08 (×4): 40 mg via ORAL
  Filled 2013-08-01 (×4): qty 1

## 2013-08-01 MED ORDER — SODIUM CHLORIDE 0.9 % IJ SOLN: 9.0000 mL | INTRAMUSCULAR | Status: DC | PRN

## 2013-08-01 MED ORDER — PANTOPRAZOLE SODIUM 40 MG IV SOLR
40.0000 mg | Freq: Every day | INTRAVENOUS | Status: DC
  Administered 2013-08-01 – 2013-08-04 (×4): 40 mg via INTRAVENOUS
  Filled 2013-08-01 (×10): qty 40

## 2013-08-01 MED ORDER — MORPHINE SULFATE 2 MG/ML IJ SOLN
1.0000 mg | Freq: Once | INTRAMUSCULAR | Status: AC
  Administered 2013-08-01: 1 mg via INTRAVENOUS
  Filled 2013-08-01: qty 1

## 2013-08-01 MED ORDER — ASPIRIN 81 MG PO CHEW: 324.0000 mg | CHEWABLE_TABLET | Freq: Once | ORAL | Status: DC

## 2013-08-01 MED ORDER — ONDANSETRON HCL 4 MG/2ML IJ SOLN
4.0000 mg | Freq: Four times a day (QID) | INTRAMUSCULAR | Status: DC | PRN
  Administered 2013-08-01 – 2013-08-02 (×2): 4 mg via INTRAVENOUS
  Filled 2013-08-01 (×2): qty 2

## 2013-08-01 MED ORDER — DEXTROSE 5 % IV SOLN
10.0000 mg | Freq: Once | INTRAVENOUS | Status: AC
  Administered 2013-08-01: 10 mg via INTRAVENOUS
  Filled 2013-08-01: qty 1

## 2013-08-01 MED ORDER — DEXTROSE-NACL 5-0.9 % IV SOLN
INTRAVENOUS | Status: DC
  Administered 2013-08-01: 15:00:00 via INTRAVENOUS
  Administered 2013-08-02: 1000 mL via INTRAVENOUS
  Administered 2013-08-02 – 2013-08-03 (×3): via INTRAVENOUS

## 2013-08-01 MED ORDER — DIPHENHYDRAMINE HCL 12.5 MG/5ML PO ELIX
12.5000 mg | ORAL_SOLUTION | Freq: Four times a day (QID) | ORAL | Status: DC | PRN
  Filled 2013-08-01: qty 5

## 2013-08-01 MED ORDER — DIPHENHYDRAMINE HCL 50 MG/ML IJ SOLN
12.5000 mg | Freq: Four times a day (QID) | INTRAMUSCULAR | Status: DC | PRN
  Administered 2013-08-02: 12.5 mg via INTRAVENOUS
  Filled 2013-08-01: qty 1

## 2013-08-01 MED ORDER — IOHEXOL 300 MG/ML  SOLN
80.0000 mL | Freq: Once | INTRAMUSCULAR | Status: AC | PRN
  Administered 2013-08-01: 60 mL via INTRAVENOUS

## 2013-08-01 MED ORDER — SODIUM CHLORIDE 0.9 % IV BOLUS (SEPSIS)
500.0000 mL | Freq: Once | INTRAVENOUS | Status: AC
  Administered 2013-08-01: 500 mL via INTRAVENOUS

## 2013-08-01 NOTE — ED Notes (Signed)
ED MD aware of pt c/o nausea & hypotension after 500 ml bolus, continuing with second bolus & medicated for nausea. IV team paged

## 2013-08-01 NOTE — ED Notes (Signed)
Pt transported to CT scan.

## 2013-08-01 NOTE — ED Notes (Signed)
IV team at bedside 

## 2013-08-01 NOTE — ED Notes (Signed)
Dr. Derrell Lolling notified & aware of CG4 result, continued SBP 99, pt c/o pain to left wrist.

## 2013-08-01 NOTE — ED Notes (Signed)
Pt's chaplain is at bedside
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
## 2013-08-01 NOTE — Progress Notes (Signed)
Orthopedic Tech Progress Note Patient Details:  Wanna Gully 1946/01/05 [id number]  Ortho Devices Type of Ortho Device: Ace wrap;Sugartong splint (kuzman sling) Ortho Device/Splint Location: lue Ortho Device/Splint Interventions: Application As ordered by Dr. Lyman Speller, Corie Allis 08/01/2013, 7:29 PM

## 2013-08-01 NOTE — ED Provider Notes (Signed)
Patient seen and evaluated upon her arrival. She is in a cervical collar. She is neurologically intact. She is tender along her right lateral chest. No focal diminished breath sounds. No bony crepitus. No subcutaneous air. Some tenderness right upper abdomen. Fast ultrasound by myself times for review shows no free fluid and no effusion. X-rays reviewed. I made, surgical consultation. Dr. Derrell Lolling is here evaluating the patient. Initial chest x-ray shows pulmonary contusion. Rate is radiology as possible CHF. Her blood pressure dipped initially to 70s and 80s and 90. Has improved 1 L fluid.  I discussed the case with neurosurgery.Dr. Newell Coral. He'll see the patient in the ICU.  She has improved her pressure. Currently hemodynamically stable. Upon seeing the CT report of an epidural hematoma I asked for emergent immediate availability of fresh frozen plasma. His given IV vitamin K. Caused in place with a pH orthopedics regarding her distal radius fracture.  Critical care time on this patient is 45 minutes. I felt this was necessary with the patient's multiple injuries. This was involving multiple consultations initial evaluation multiple re\re evaluations administration of blood products consent for blood products crystalloid infusion for blood pressure for hypotension.  Roney Marion, MD 08/01/13 [phone]

## 2013-08-01 NOTE — ED Notes (Signed)
Fayrene Fearing, MD in  Room at this time and is aware of blood pressure

## 2013-08-01 NOTE — ED Notes (Signed)
Pt states she is hurting all over her body. Remains hypotensive on cardiac monitor. EDP notified. IV called for 2nd peripheral line.
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
## 2013-08-01 NOTE — ED Notes (Signed)
Pt placed on oxygen [place] 2 L
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
## 2013-08-01 NOTE — ED Notes (Signed)
Dr. Newell Coral at bedside. C-collar changed to Aspen collar. Tolerated well.
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
## 2013-08-01 NOTE — Progress Notes (Signed)
Ordering MD Dr Rolland Porter wanted pt to be scanned with IV contrast, but did not want to wait for lab results.  Instructed that due to pt's condition she needed contrast, but needed to be scanned now without bun/crea.  Instructed to give a reduced dose.

## 2013-08-01 NOTE — ED Notes (Signed)
Dr. Derrell Lolling at bedside. Aware of SBP 94 after NS bolus.

## 2013-08-01 NOTE — ED Notes (Signed)
Trauma MD Derrell Lolling at bedside at this time

## 2013-08-01 NOTE — ED Notes (Signed)
Pt undressed, in gown, on monitor, continuous pulse oximetry and blood pressure cuff 

## 2013-08-02 ENCOUNTER — Inpatient Hospital Stay (HOSPITAL_COMMUNITY): Payer: No Typology Code available for payment source

## 2013-08-02 ENCOUNTER — Encounter (HOSPITAL_COMMUNITY): Payer: Self-pay | Admitting: Radiology

## 2013-08-02 DIAGNOSIS — I4891 Unspecified atrial fibrillation: Secondary | ICD-10-CM

## 2013-08-02 LAB — CBC
HCT: 25.4 % — ABNORMAL LOW (ref 36.0–46.0)
HCT: 25.6 % — ABNORMAL LOW (ref 36.0–46.0)
Hemoglobin: 8.9 g/dL — ABNORMAL LOW (ref 12.0–15.0)
Hemoglobin: 8.4 g/dL — ABNORMAL LOW (ref 12.0–15.0)
MCH: 30.7 pg (ref 26.0–34.0)
MCH: 29.3 pg (ref 26.0–34.0)
MCHC: 35 g/dL (ref 30.0–36.0)
MCHC: 32.8 g/dL (ref 30.0–36.0)
MCV: 87.6 fL (ref 78.0–100.0)
MCV: 89.2 fL (ref 78.0–100.0)
Platelets: 179 10*3/uL (ref 150–400)
Platelets: 169 10*3/uL (ref 150–400)
RBC: 2.9 MIL/uL — ABNORMAL LOW (ref 3.87–5.11)
RBC: 2.87 MIL/uL — ABNORMAL LOW (ref 3.87–5.11)
RDW: 14.5 % (ref 11.5–15.5)
RDW: 14.7 % (ref 11.5–15.5)
WBC: 12.8 10*3/uL — ABNORMAL HIGH (ref 4.0–10.5)
WBC: 11.3 10*3/uL — ABNORMAL HIGH (ref 4.0–10.5)

## 2013-08-02 LAB — PREPARE FRESH FROZEN PLASMA
Unit division: 0
Unit division: 0

## 2013-08-02 LAB — PROTIME-INR
INR: 1.24 (ref 0.00–1.49)
Prothrombin Time: 15.3 seconds — ABNORMAL HIGH (ref 11.6–15.2)

## 2013-08-02 LAB — BASIC METABOLIC PANEL
BUN: 27 mg/dL — ABNORMAL HIGH (ref 6–23)
BUN: 29 mg/dL — ABNORMAL HIGH (ref 6–23)
CO2: 15 mEq/L — ABNORMAL LOW (ref 19–32)
CO2: 21 mEq/L (ref 19–32)
Calcium: 8.4 mg/dL (ref 8.4–10.5)
Chloride: 103 mEq/L (ref 96–112)
Chloride: 107 mEq/L (ref 96–112)
Creatinine, Ser: 0.79 mg/dL (ref 0.50–1.10)
Creatinine, Ser: 0.86 mg/dL (ref 0.50–1.10)
GFR calc Af Amer: >90 mL/min (ref >90)
GFR calc Af Amer: 79 mL/min — ABNORMAL LOW (ref >90)
GFR calc non Af Amer: 84 mL/min — ABNORMAL LOW (ref >90)
GFR calc non Af Amer: 68 mL/min — ABNORMAL LOW (ref >90)
Glucose, Bld: 194 mg/dL — ABNORMAL HIGH (ref 70–99)
Glucose, Bld: 191 mg/dL — ABNORMAL HIGH (ref 70–99)
Potassium: 4.3 mEq/L (ref 3.5–5.1)
Sodium: 137 mEq/L (ref 135–145)
Sodium: 140 mEq/L (ref 135–145)

## 2013-08-02 MED ORDER — METOPROLOL TARTRATE 1 MG/ML IV SOLN
2.5000 mg | Freq: Once | INTRAVENOUS | Status: AC
  Administered 2013-08-02: 2.5 mg via INTRAVENOUS
  Filled 2013-08-02: qty 5

## 2013-08-02 MED ORDER — AMIODARONE LOAD VIA INFUSION
150.0000 mg | Freq: Once | INTRAVENOUS | Status: AC
Start: 2013-08-02 — End: 2013-08-02
  Administered 2013-08-02: 150 mg via INTRAVENOUS
  Filled 2013-08-02: qty 83.34

## 2013-08-02 MED ORDER — SODIUM CHLORIDE 0.9 % IV SOLN
INTRAVENOUS | Status: DC
  Administered 2013-08-02: 10 mL/h via INTRAVENOUS
  Administered 2013-08-04 – 2013-08-05 (×2): via INTRAVENOUS
  Administered 2013-08-06: 1000 mL via INTRAVENOUS
  Administered 2013-08-06: 09:00:00 via INTRAVENOUS

## 2013-08-02 MED ORDER — METOPROLOL TARTRATE 12.5 MG HALF TABLET
12.5000 mg | ORAL_TABLET | Freq: Four times a day (QID) | ORAL | Status: DC
  Administered 2013-08-02: 12.5 mg via ORAL
  Filled 2013-08-02 (×2): qty 1

## 2013-08-02 MED ORDER — ALBUTEROL SULFATE (5 MG/ML) 0.5% IN NEBU
2.5000 mg | INHALATION_SOLUTION | Freq: Four times a day (QID) | RESPIRATORY_TRACT | Status: DC
  Administered 2013-08-02: 2.5 mg via RESPIRATORY_TRACT
  Filled 2013-08-02 (×2): qty 0.5

## 2013-08-02 MED ORDER — ALBUTEROL SULFATE (5 MG/ML) 0.5% IN NEBU
INHALATION_SOLUTION | RESPIRATORY_TRACT | Status: AC
  Administered 2013-08-02: 2.5 mg
  Filled 2013-08-02: qty 0.5

## 2013-08-02 MED ORDER — AMIODARONE LOAD VIA INFUSION
150.0000 mg | Freq: Once | INTRAVENOUS | Status: AC
  Administered 2013-08-02: 150 mg via INTRAVENOUS

## 2013-08-02 MED ORDER — METOPROLOL TARTRATE 1 MG/ML IV SOLN
INTRAVENOUS | Status: AC
  Filled 2013-08-02: qty 5

## 2013-08-02 MED ORDER — AMIODARONE HCL IN DEXTROSE 360-4.14 MG/200ML-% IV SOLN
60.0000 mg/h | INTRAVENOUS | Status: AC
  Administered 2013-08-02: 60 mg/h via INTRAVENOUS
  Filled 2013-08-02 (×2): qty 200

## 2013-08-02 MED ORDER — AMIODARONE HCL IN DEXTROSE 360-4.14 MG/200ML-% IV SOLN
30.0000 mg/h | INTRAVENOUS | Status: DC
  Administered 2013-08-02 – 2013-08-03 (×3): 30 mg/h via INTRAVENOUS
  Filled 2013-08-02 (×10): qty 200

## 2013-08-02 MED ORDER — IPRATROPIUM BROMIDE 0.02 % IN SOLN
0.5000 mg | Freq: Four times a day (QID) | RESPIRATORY_TRACT | Status: DC
  Administered 2013-08-02 (×2): 0.5 mg via RESPIRATORY_TRACT
  Filled 2013-08-02 (×2): qty 2.5

## 2013-08-02 MED ORDER — IPRATROPIUM BROMIDE 0.02 % IN SOLN
RESPIRATORY_TRACT | Status: AC
  Administered 2013-08-02: 0.5 mg
  Filled 2013-08-02: qty 2.5

## 2013-08-02 MED ORDER — METOPROLOL TARTRATE 1 MG/ML IV SOLN
5.0000 mg | Freq: Once | INTRAVENOUS | Status: AC
  Administered 2013-08-02: 5 mg via INTRAVENOUS

## 2013-08-02 MED ORDER — IOHEXOL 350 MG/ML SOLN
50.0000 mL | Freq: Once | INTRAVENOUS | Status: AC | PRN
  Administered 2013-08-02: 50 mL via INTRAVENOUS
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
## 2013-08-02 NOTE — Progress Notes (Signed)
While patient was doing incentive spirometer, converted to afib rate 140s-160s. Dr. Janee Morn notified. Orders received. Dr. Also notified of decreasing HGB and of patient's low urine output. No orders received in regards to those. Will continue to monitor.
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
## 2013-08-02 NOTE — Progress Notes (Signed)
Trauma asked for Dr. Ophelia Charter to be notified of patient's heart rate and rhythm change. Decision to delay surgery per Dr. Ophelia Charter (see note). Will monitor.

## 2013-08-02 NOTE — ED Provider Notes (Signed)
Medical screening examination/treatment/procedure(s) were conducted as a shared visit with non-physician practitioner(s) and myself.  I personally evaluated the patient during the encounter.  EKG Interpretation    Date/Time:    Ventricular Rate:    PR Interval:    QRS Duration:   QT Interval:    QTC Calculation:   R Axis:     Text Interpretation:              Please see my above note.  Pt seen on arrival.  I participated fully in the evaluation, diagnostics, treatment, consultation, and admission of this patient.  Rolland Porter, MD 08/02/13 2245
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
## 2013-08-02 NOTE — Progress Notes (Signed)
Dr. Mayford Knife notified of patient's increased HR despite amiodarone infusion. Order received for bolus and to notify in one hour if no change has occurred. Will monitor.
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
## 2013-08-03 ENCOUNTER — Inpatient Hospital Stay (HOSPITAL_COMMUNITY): Payer: No Typology Code available for payment source

## 2013-08-03 ENCOUNTER — Encounter (HOSPITAL_COMMUNITY): Payer: No Typology Code available for payment source | Admitting: Anesthesiology

## 2013-08-03 ENCOUNTER — Inpatient Hospital Stay (HOSPITAL_COMMUNITY): Payer: No Typology Code available for payment source | Admitting: Anesthesiology

## 2013-08-03 ENCOUNTER — Encounter (HOSPITAL_COMMUNITY): Admission: EM | Disposition: A | Discharge status: Skilled Nursing Facility | Payer: Self-pay | Source: Home / Self Care

## 2013-08-03 ENCOUNTER — Encounter (HOSPITAL_COMMUNITY): Payer: Self-pay | Admitting: Certified Registered Nurse Anesthetist

## 2013-08-03 DIAGNOSIS — D62 Acute posthemorrhagic anemia: Secondary | ICD-10-CM

## 2013-08-03 HISTORY — PX: ORIF WRIST FRACTURE: SHX2133

## 2013-08-03 LAB — POCT I-STAT 3, ART BLOOD GAS (G3+)
Bicarbonate: 25.5 meq/L — ABNORMAL HIGH (ref 20.0–24.0)
O2 Saturation: 95 %
Patient temperature: 98.6
TCO2: 27 mmol/L (ref 0–100)
pCO2 arterial: 42.3 mmHg (ref 35.0–45.0)
pH, Arterial: 7.389 (ref 7.350–7.450)
pO2, Arterial: 79 mmHg — ABNORMAL LOW (ref 80.0–100.0)

## 2013-08-03 LAB — CBC
HCT: 20 % — ABNORMAL LOW (ref 36.0–46.0)
Hemoglobin: 6.6 g/dL — CL (ref 12.0–15.0)
MCH: 29.5 pg (ref 26.0–34.0)
MCHC: 33 g/dL (ref 30.0–36.0)
MCV: 89.3 fL (ref 78.0–100.0)
Platelets: 135 10*3/uL — ABNORMAL LOW (ref 150–400)
RBC: 2.24 MIL/uL — ABNORMAL LOW (ref 3.87–5.11)
RDW: 15.1 % (ref 11.5–15.5)
WBC: 11.6 10*3/uL — ABNORMAL HIGH (ref 4.0–10.5)

## 2013-08-03 LAB — BASIC METABOLIC PANEL
BUN: 21 mg/dL (ref 6–23)
CO2: 23 mEq/L (ref 19–32)
Calcium: 8 mg/dL — ABNORMAL LOW (ref 8.4–10.5)
Chloride: 109 mEq/L (ref 96–112)
Creatinine, Ser: 0.68 mg/dL (ref 0.50–1.10)
GFR calc Af Amer: >90 mL/min (ref >90)
GFR calc non Af Amer: 89 mL/min — ABNORMAL LOW (ref >90)
Glucose, Bld: 172 mg/dL — ABNORMAL HIGH (ref 70–99)
Potassium: 3.9 mEq/L (ref 3.5–5.1)
Sodium: 141 mEq/L (ref 135–145)

## 2013-08-03 LAB — HEMOGLOBIN AND HEMATOCRIT, BLOOD
HCT: 27.7 % — ABNORMAL LOW (ref 36.0–46.0)
Hemoglobin: 9.1 g/dL — ABNORMAL LOW (ref 12.0–15.0)

## 2013-08-03 LAB — PREPARE RBC (CROSSMATCH)

## 2013-08-03 SURGERY — OPEN REDUCTION INTERNAL FIXATION (ORIF) WRIST FRACTURE
Anesthesia: General | Site: Arm Lower | Laterality: Left

## 2013-08-03 MED ORDER — LACTATED RINGERS IV SOLN
INTRAVENOUS | Status: DC
Start: 2013-08-03 — End: 2013-08-03
  Administered 2013-08-03: 17:00:00 via INTRAVENOUS

## 2013-08-03 MED ORDER — PROPOFOL 10 MG/ML IV BOLUS
INTRAVENOUS | Status: DC | PRN
  Administered 2013-08-03: 80 mg via INTRAVENOUS

## 2013-08-03 MED ORDER — METOPROLOL TARTRATE 1 MG/ML IV SOLN
INTRAVENOUS | Status: AC
  Filled 2013-08-03: qty 5

## 2013-08-03 MED ORDER — LEVALBUTEROL HCL 0.63 MG/3ML IN NEBU
0.6300 mg | INHALATION_SOLUTION | Freq: Four times a day (QID) | RESPIRATORY_TRACT | Status: DC | PRN
  Filled 2013-08-03: qty 3

## 2013-08-03 MED ORDER — IOHEXOL 300 MG/ML  SOLN
100.0000 mL | Freq: Once | INTRAMUSCULAR | Status: AC | PRN
  Administered 2013-08-03: 100 mL via INTRAVENOUS

## 2013-08-03 MED ORDER — OXYCODONE HCL 5 MG/5ML PO SOLN: 5.0000 mg | Freq: Once | ORAL | Status: DC | PRN

## 2013-08-03 MED ORDER — SUCCINYLCHOLINE CHLORIDE 20 MG/ML IJ SOLN
INTRAMUSCULAR | Status: DC | PRN
  Administered 2013-08-03: 100 mg via INTRAVENOUS

## 2013-08-03 MED ORDER — FENTANYL CITRATE 0.05 MG/ML IJ SOLN
INTRAMUSCULAR | Status: DC | PRN
  Administered 2013-08-03 (×2): 50 ug via INTRAVENOUS
  Administered 2013-08-03: 25 ug via INTRAVENOUS
  Administered 2013-08-03: 50 ug via INTRAVENOUS
  Administered 2013-08-03: 25 ug via INTRAVENOUS
  Administered 2013-08-03: 50 ug via INTRAVENOUS

## 2013-08-03 MED ORDER — MIDAZOLAM HCL 5 MG/5ML IJ SOLN
INTRAMUSCULAR | Status: DC | PRN
  Administered 2013-08-03: 2 mg via INTRAVENOUS

## 2013-08-03 MED ORDER — LEVALBUTEROL HCL 0.63 MG/3ML IN NEBU
0.6300 mg | INHALATION_SOLUTION | Freq: Two times a day (BID) | RESPIRATORY_TRACT | Status: DC
  Administered 2013-08-03 – 2013-08-06 (×7): 0.63 mg via RESPIRATORY_TRACT
  Filled 2013-08-03 (×12): qty 3

## 2013-08-03 MED ORDER — ONDANSETRON HCL 4 MG/2ML IJ SOLN: 4.0000 mg | Freq: Four times a day (QID) | INTRAMUSCULAR | Status: DC | PRN

## 2013-08-03 MED ORDER — PROMETHAZINE HCL 25 MG/ML IJ SOLN: 6.2500 mg | INTRAMUSCULAR | Status: DC | PRN

## 2013-08-03 MED ORDER — CHLORHEXIDINE GLUCONATE 0.12 % MT SOLN
15.0000 mL | Freq: Two times a day (BID) | OROMUCOSAL | Status: DC
  Administered 2013-08-03 – 2013-08-09 (×12): 15 mL via OROMUCOSAL
  Filled 2013-08-03 (×16): qty 15

## 2013-08-03 MED ORDER — OXYCODONE HCL 5 MG PO TABS: 5.0000 mg | ORAL_TABLET | Freq: Once | ORAL | Status: DC | PRN

## 2013-08-03 MED ORDER — NALOXONE HCL 0.4 MG/ML IJ SOLN: 0.4000 mg | INTRAMUSCULAR | Status: DC | PRN

## 2013-08-03 MED ORDER — DILTIAZEM HCL 100 MG IV SOLR
5.0000 mg/h | INTRAVENOUS | Status: DC
  Filled 2013-08-03: qty 100

## 2013-08-03 MED ORDER — AMIODARONE HCL IN DEXTROSE 360-4.14 MG/200ML-% IV SOLN
INTRAVENOUS | Status: DC | PRN
  Administered 2013-08-03: 30 mg/h via INTRAVENOUS

## 2013-08-03 MED ORDER — ARTIFICIAL TEARS OP OINT
TOPICAL_OINTMENT | OPHTHALMIC | Status: DC | PRN
  Administered 2013-08-03: 1 via OPHTHALMIC

## 2013-08-03 MED ORDER — HYDROMORPHONE 0.3 MG/ML IV SOLN
INTRAVENOUS | Status: DC
  Administered 2013-08-03: 1.5 mg via INTRAVENOUS

## 2013-08-03 MED ORDER — ONDANSETRON HCL 4 MG/2ML IJ SOLN
INTRAMUSCULAR | Status: DC | PRN
  Administered 2013-08-03: 4 mg via INTRAVENOUS

## 2013-08-03 MED ORDER — PHENYLEPHRINE HCL 10 MG/ML IJ SOLN
10.0000 mg | INTRAVENOUS | Status: DC | PRN
  Administered 2013-08-03: 5 ug/min via INTRAVENOUS

## 2013-08-03 MED ORDER — PHENYLEPHRINE HCL 10 MG/ML IJ SOLN
INTRAMUSCULAR | Status: DC | PRN
  Administered 2013-08-03: 80 ug via INTRAVENOUS
  Administered 2013-08-03: 40 ug via INTRAVENOUS
  Administered 2013-08-03: 80 ug via INTRAVENOUS
  Administered 2013-08-03: 40 ug via INTRAVENOUS
  Administered 2013-08-03 (×3): 80 ug via INTRAVENOUS

## 2013-08-03 MED ORDER — BIOTENE DRY MOUTH MT LIQD
15.0000 mL | Freq: Four times a day (QID) | OROMUCOSAL | Status: DC
  Administered 2013-08-04 – 2013-08-10 (×24): 15 mL via OROMUCOSAL

## 2013-08-03 MED ORDER — BUPIVACAINE HCL (PF) 0.25 % IJ SOLN
INTRAMUSCULAR | Status: DC | PRN
  Administered 2013-08-03: 8 mL

## 2013-08-03 MED ORDER — DIPHENHYDRAMINE HCL 12.5 MG/5ML PO ELIX
12.5000 mg | ORAL_SOLUTION | Freq: Four times a day (QID) | ORAL | Status: DC | PRN
  Filled 2013-08-03: qty 5

## 2013-08-03 MED ORDER — CEFAZOLIN SODIUM-DEXTROSE 2-3 GM-% IV SOLR
INTRAVENOUS | Status: AC
  Administered 2013-08-03: 2 g via INTRAVENOUS
  Filled 2013-08-03: qty 50

## 2013-08-03 MED ORDER — 0.9 % SODIUM CHLORIDE (POUR BTL) OPTIME
TOPICAL | Status: DC | PRN
  Administered 2013-08-03: 1000 mL

## 2013-08-03 MED ORDER — HYDROMORPHONE HCL PF 1 MG/ML IJ SOLN: 0.2500 mg | INTRAMUSCULAR | Status: DC | PRN

## 2013-08-03 MED ORDER — METOPROLOL TARTRATE 25 MG PO TABS
25.0000 mg | ORAL_TABLET | Freq: Four times a day (QID) | ORAL | Status: DC
  Administered 2013-08-03 – 2013-08-07 (×11): 25 mg via ORAL
  Filled 2013-08-03 (×19): qty 1

## 2013-08-03 MED ORDER — FENTANYL CITRATE 0.05 MG/ML IJ SOLN
25.0000 ug | INTRAMUSCULAR | Status: DC | PRN
  Administered 2013-08-03: 50 ug via INTRAVENOUS
  Administered 2013-08-03 – 2013-08-04 (×2): 100 ug via INTRAVENOUS
  Administered 2013-08-04: 50 ug via INTRAVENOUS
  Administered 2013-08-04: 100 ug via INTRAVENOUS
  Administered 2013-08-07 – 2013-08-08 (×2): 50 ug via INTRAVENOUS
  Filled 2013-08-03 (×7): qty 2

## 2013-08-03 MED ORDER — METOPROLOL TARTRATE 12.5 MG HALF TABLET
12.5000 mg | ORAL_TABLET | Freq: Once | ORAL | Status: AC
  Administered 2013-08-03: 12.5 mg via ORAL
  Filled 2013-08-03: qty 1

## 2013-08-03 MED ORDER — ROCURONIUM BROMIDE 100 MG/10ML IV SOLN
INTRAVENOUS | Status: DC | PRN
  Administered 2013-08-03: 20 mg via INTRAVENOUS

## 2013-08-03 MED ORDER — DIPHENHYDRAMINE HCL 50 MG/ML IJ SOLN: 12.5000 mg | Freq: Four times a day (QID) | INTRAMUSCULAR | Status: DC | PRN

## 2013-08-03 MED ORDER — MEPERIDINE HCL 25 MG/ML IJ SOLN: 6.2500 mg | INTRAMUSCULAR | Status: DC | PRN

## 2013-08-03 MED ORDER — LACTATED RINGERS IV SOLN
INTRAVENOUS | Status: DC | PRN
  Administered 2013-08-03 (×2): via INTRAVENOUS

## 2013-08-03 MED ORDER — BUPIVACAINE HCL (PF) 0.25 % IJ SOLN
INTRAMUSCULAR | Status: AC
  Filled 2013-08-03: qty 30

## 2013-08-03 MED ORDER — WHITE PETROLATUM GEL
Status: AC
  Administered 2013-08-03: 02:00:00
  Filled 2013-08-03: qty 5

## 2013-08-03 MED ORDER — LIDOCAINE HCL (CARDIAC) 20 MG/ML IV SOLN
INTRAVENOUS | Status: DC | PRN
  Administered 2013-08-03: 40 mg via INTRAVENOUS

## 2013-08-03 MED ORDER — METOPROLOL TARTRATE 1 MG/ML IV SOLN
5.0000 mg | INTRAVENOUS | Status: DC | PRN
Start: 2013-08-03 — End: 2013-08-03
  Filled 2013-08-03: qty 5

## 2013-08-03 MED ORDER — METOPROLOL TARTRATE 25 MG PO TABS
25.0000 mg | ORAL_TABLET | Freq: Four times a day (QID) | ORAL | Status: DC
  Administered 2013-08-03: 25 mg via ORAL
  Filled 2013-08-03 (×4): qty 1

## 2013-08-03 MED ORDER — SODIUM CHLORIDE 0.9 % IJ SOLN: 9.0000 mL | INTRAMUSCULAR | Status: DC | PRN

## 2013-08-03 SURGICAL SUPPLY — 69 items
BANDAGE ELASTIC 3 VELCRO ST LF (GAUZE/BANDAGES/DRESSINGS) ×2 IMPLANT
BANDAGE ELASTIC 4 VELCRO ST LF (GAUZE/BANDAGES/DRESSINGS) ×2 IMPLANT
BANDAGE GAUZE ELAST BULKY 4 IN (GAUZE/BANDAGES/DRESSINGS) ×4 IMPLANT
BIT DRILL 2 FAST STEP (BIT) ×1 IMPLANT
BIT DRILL 2.5X4 QC (BIT) ×1 IMPLANT
BNDG CMPR 9X4 STRL LF SNTH (GAUZE/BANDAGES/DRESSINGS) ×1
BNDG ESMARK 4X9 LF (GAUZE/BANDAGES/DRESSINGS) ×2 IMPLANT
BNDG GAUZE ELAST 4 BULKY (GAUZE/BANDAGES/DRESSINGS) ×1 IMPLANT
CANISTER SUCTION 2500CC (MISCELLANEOUS) ×1 IMPLANT
CLOTH BEACON ORANGE TIMEOUT ST (SAFETY) ×2 IMPLANT
CORDS BIPOLAR (ELECTRODE) IMPLANT
COVER SURGICAL LIGHT HANDLE (MISCELLANEOUS) ×2 IMPLANT
CUFF TOURNIQUET SINGLE 18IN (TOURNIQUET CUFF) ×2 IMPLANT
CUFF TOURNIQUET SINGLE 24IN (TOURNIQUET CUFF) IMPLANT
DRAPE OEC MINIVIEW 54X84 (DRAPES) ×1 IMPLANT
DRAPE SURG 17X23 STRL (DRAPES) ×2 IMPLANT
DURAPREP 26ML APPLICATOR (WOUND CARE) ×2 IMPLANT
ELECT REM PT RETURN 9FT ADLT (ELECTROSURGICAL) ×2
ELECTRODE REM PT RTRN 9FT ADLT (ELECTROSURGICAL) IMPLANT
GAUZE XEROFORM 1X8 LF (GAUZE/BANDAGES/DRESSINGS) ×2 IMPLANT
GLOVE BIOGEL PI IND STRL 8 (GLOVE) ×1 IMPLANT
GLOVE BIOGEL PI INDICATOR 8 (GLOVE) ×1
GLOVE ORTHO TXT STRL SZ7.5 (GLOVE) ×2 IMPLANT
GOWN SRG XL XLNG 56XLVL 4 (GOWN DISPOSABLE) ×1 IMPLANT
GOWN STRL NON-REIN LRG LVL3 (GOWN DISPOSABLE) ×2 IMPLANT
GOWN STRL NON-REIN XL XLG LVL4 (GOWN DISPOSABLE)
K-WIRE 1.6 (WIRE) ×4
K-WIRE FX5X1.6XNS BN SS (WIRE) ×2
KIT BASIN OR (CUSTOM PROCEDURE TRAY) ×2 IMPLANT
KIT ROOM TURNOVER OR (KITS) ×2 IMPLANT
KWIRE FX5X1.6XNS BN SS (WIRE) IMPLANT
MANIFOLD NEPTUNE II (INSTRUMENTS) ×1 IMPLANT
NDL HYPO 25GX1X1/2 BEV (NEEDLE) IMPLANT
NDL HYPO 25X1 1.5 SAFETY (NEEDLE) IMPLANT
NEEDLE HYPO 25GX1X1/2 BEV (NEEDLE) IMPLANT
NEEDLE HYPO 25X1 1.5 SAFETY (NEEDLE) ×2 IMPLANT
NS IRRIG 1000ML POUR BTL (IV SOLUTION) ×2 IMPLANT
PACK ORTHO EXTREMITY (CUSTOM PROCEDURE TRAY) ×2 IMPLANT
PAD ARMBOARD 7.5X6 YLW CONV (MISCELLANEOUS) ×4 IMPLANT
PAD CAST 3X4 CTTN HI CHSV (CAST SUPPLIES) ×1 IMPLANT
PAD CAST 4YDX4 CTTN HI CHSV (CAST SUPPLIES) ×1 IMPLANT
PADDING CAST COTTON 3X4 STRL (CAST SUPPLIES) ×2
PADDING CAST COTTON 4X4 STRL (CAST SUPPLIES) ×2
PEG SUBCHONDRAL SMOOTH 2.0X18 (Peg) ×1 IMPLANT
PEG SUBCHONDRAL SMOOTH 2.0X20 (Peg) ×2 IMPLANT
PEG SUBCHONDRAL SMOOTH 2.0X22 (Peg) ×2 IMPLANT
PEG THREADED 2.5MMX20MM LONG (Peg) ×1 IMPLANT
PENCIL BUTTON HOLSTER BLD 10FT (ELECTRODE) IMPLANT
PLATE STAN 24.4X59.5 LT (Plate) ×1 IMPLANT
SCREW BN 12X3.5XNS CORT TI (Screw) IMPLANT
SCREW CORT 3.5X12 (Screw) ×4 IMPLANT
SCREW MULTI DIRECT 18MM (Screw) ×1 IMPLANT
SPLINT PLASTER CAST XFAST 4X15 (CAST SUPPLIES) IMPLANT
SPLINT PLASTER XTRA FAST SET 4 (CAST SUPPLIES) ×1
SPONGE GAUZE 4X4 12PLY (GAUZE/BANDAGES/DRESSINGS) ×2 IMPLANT
SPONGE LAP 4X18 X RAY DECT (DISPOSABLE) ×4 IMPLANT
SPONGE SCRUB IODOPHOR (GAUZE/BANDAGES/DRESSINGS) ×1 IMPLANT
SUCTION FRAZIER TIP 10 FR DISP (SUCTIONS) IMPLANT
SUT BONE WAX W31G (SUTURE) ×1 IMPLANT
SUT ETHILON 4 0 PS 2 18 (SUTURE) ×2 IMPLANT
SUT PROLENE 3 0 PS 2 (SUTURE) IMPLANT
SUT VIC AB 3-0 FS2 27 (SUTURE) IMPLANT
SUT VICRYL 4-0 PS2 18IN ABS (SUTURE) IMPLANT
SYR CONTROL 10ML LL (SYRINGE) ×1 IMPLANT
TOWEL OR 17X24 6PK STRL BLUE (TOWEL DISPOSABLE) ×2 IMPLANT
TOWEL OR 17X26 10 PK STRL BLUE (TOWEL DISPOSABLE) ×2 IMPLANT
TUBE CONNECTING 12X1/4 (SUCTIONS) ×1 IMPLANT
UNDERPAD 30X30 INCONTINENT (UNDERPADS AND DIAPERS) ×2 IMPLANT
WATER STERILE IRR 1000ML POUR (IV SOLUTION) ×1 IMPLANT
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
## 2013-08-03 NOTE — Progress Notes (Signed)
Pts. HR still sustaining in the high 110's-130's, pt. Asymptomatic denies any chest pain or short of breath, uses her PCA pain med with good relief. Dr. Dayton Martes made aware with orders made, 2nd dose 12.5mg . Lopressor po given. Will cont. to monitor VS.
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
## 2013-08-03 NOTE — Anesthesia Postprocedure Evaluation (Signed)
  Anesthesia Post-op Note  Patient: [name]  Procedure(s) Performed: Procedure(s): OPEN REDUCTION INTERNAL FIXATION (ORIF) WRIST FRACTURE (Left)  Patient Location: ICU  Anesthesia Type:General  Level of Consciousness: Patient remains intubated per anesthesia plan  Airway and Oxygen Therapy: Patient remains intubated per anesthesia plan and Patient placed on Ventilator (see vital sign flow sheet for setting)  Post-op Pain: none  Post-op Assessment: Post-op Vital signs reviewed, PATIENT'S CARDIOVASCULAR STATUS UNSTABLE, Respiratory Function Stable, Patent Airway, No signs of Nausea or vomiting and Pain level controlled  Post-op Vital Signs: Reviewed and stable  Complications: No apparent anesthesia complications

## 2013-08-03 NOTE — Progress Notes (Signed)
Subjective: Day of Surgery Procedure(s) (LRB): OPEN REDUCTION INTERNAL FIXATION (ORIF) WRIST FRACTURE (Left) Patient reports pain as mild.    Objective: Vital signs in last 24 hours: Temp:  [97.6 F (36.4 Velasquez)-99.4 F (37.4 Velasquez)] 98.1 F (36.7 Velasquez) (12/23 0600) Pulse Rate:  [46-150] 140 (12/23 0700) Resp:  [15-30] 20 (12/23 0700) BP: (97-138)/(43-113) 113/79 mmHg (12/23 0700) SpO2:  [82 %-100 %] 96 % (12/23 0700) FiO2 (%):  [2 %] 2 % (12/23 0230)  Intake/Output from previous day: 12/22 0701 - 12/23 0700 In: 2639.9 [P.O.:120; I.V.:2519.9] Out: 1295 [Urine:1295] Intake/Output this shift:     Recent Labs  08/01/13 1351 08/02/13 0600 08/02/13 0835 08/03/13 0548  HGB 13.7 8.9* 8.4* 6.6*    Recent Labs  08/02/13 0835 08/03/13 0548  WBC 11.3* 11.6*  RBC 2.87* 2.24*  HCT 25.6* 20.0*  PLT 169 135*    Recent Labs  08/02/13 0835 08/03/13 0548  NA 140 141  K 4.3 3.9  CL 107 109  CO2 21 23  BUN 29* 21  CREATININE 0.86 0.68  GLUCOSE 191* 172*  CALCIUM 8.4 8.0*    Recent Labs  08/01/13 1313 08/02/13 0835  INR 2.53* 1.24    numbness in median nerve distribution without nerve compression type pain. no Hx of CTS in past.   Assessment/Plan: Day of Surgery Procedure(s) (LRB): OPEN REDUCTION INTERNAL FIXATION (ORIF) WRIST FRACTURE (Left) PLAN:     OR today for ORIF ofleft  radius and possibly ulna fractures  as long as CT does not show active bleeding problem in abdomen or pelvis etc.   Surgery planned at about 5 PM. Have discussed anesthesia options with Dr. Joslin yesterday.   2u PRBC ordered by trauma service.   [name] 08/03/2013, 7:37 AM  

## 2013-08-03 NOTE — Progress Notes (Signed)
Received back from PACU intubated and on cont. amio drip. Placed in the monitor pt. Open eyes and follows command . Starting to be agitated and restless. CCM MD was consulted for vent setting and sedation per Dr. Ophelia Charter. Dr. Glenna Fellows notified with orders made.

## 2013-08-03 NOTE — Progress Notes (Signed)
Patient ID: [name], female   DOB: Jun 12, 1946, 67 y.o.   MRN: [id number] Plan is intubation overnight per anesthesia with pt increase resp effort before surgery with 5 right rib Fx's sternal Fx Etc.     While intubated at risk for increase in Right sided Pneumothorax. No change in peek inspiratory pressures during surgery,  CXR on arrival back in ICU ordered.   Arm back up in elevator sling for 24 -48 hrs then she can have it down.          Ophelia Charter MD Orthopedics  Cell [phone].

## 2013-08-03 NOTE — Progress Notes (Signed)
Dr. Verdie Mosher was paged to clarify cardizem order since pt. Is getting amiodarone drip and HR 100's-120's afib. With order to d/c cardizem drip and continue to monitor pt.
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
## 2013-08-03 NOTE — Anesthesia Procedure Notes (Addendum)
Procedure Name: Intubation Date/Time: 08/03/2013 5:33 PM Performed by: Angelica Pou Pre-anesthesia Checklist: Patient identified, Patient being monitored, Emergency Drugs available, Timeout performed and Suction available Patient Re-evaluated:Patient Re-evaluated prior to inductionOxygen Delivery Method: Circle system utilized Preoxygenation: Pre-oxygenation with 100% oxygen Intubation Type: IV induction and Rapid sequence Laryngoscope size: Glidescope #3. Grade View: Grade I Tube type: Subglottic suction tube Tube size: 7.5 mm Number of attempts: 1 Airway Equipment and Method: Video-laryngoscopy and Rigid stylet Placement Confirmation: ETT inserted through vocal cords under direct vision,  breath sounds checked- equal and bilateral and positive ETCO2 Secured at: 23 cm Tube secured with: Tape Dental Injury: Teeth and Oropharynx as per pre-operative assessment  Comments: Vista Aspen collar in place; pt's head/neck maintained in neutral alignment throughout laryngoscopy.
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
## 2013-08-03 NOTE — Transfer of Care (Signed)
Immediate Anesthesia Transfer of Care Note  Patient: [name]  Procedure(s) Performed: Procedure(s): OPEN REDUCTION INTERNAL FIXATION (ORIF) WRIST FRACTURE (Left)  Patient Location: PACU  Anesthesia Type:General  Level of Consciousness: sedated  Airway & Oxygen Therapy: Patient remains intubated per anesthesia plan and Patient placed on Ventilator (see vital sign flow sheet for setting)  Post-op Assessment: Report given to PACU RN and Post -op Vital signs reviewed and stable  Post vital signs: Reviewed and stable  Complications: No apparent anesthesia complications

## 2013-08-03 NOTE — Brief Op Note (Signed)
08/01/2013 - 08/03/2013  7:14 PM  PATIENT:  [name]  67 y.o. female  PRE-OPERATIVE DIAGNOSIS:  Fractured Left Wrist  POST-OPERATIVE DIAGNOSIS:  Fractured Left wrist  PROCEDURE:  Procedure(s): OPEN REDUCTION INTERNAL FIXATION (ORIF) WRIST FRACTURE (Left)  SURGEON:  Surgeon(s) and Role:    * Eldred Manges, MD - Primary  PHYSICIAN ASSISTANT:   ASSISTANTS: none   ANESTHESIA:   local and general  EBL:  Total I/O In: 200 [I.V.:200] Out: -   BLOOD ADMINISTERED:none  DRAINS: none   LOCAL MEDICATIONS USED:  MARCAINE     SPECIMEN:  No Specimen  DISPOSITION OF SPECIMEN:  N/A  COUNTS:  YES  TOURNIQUET:   Total Tourniquet Time Documented: Upper Arm (Left) - 63 minutes Total: Upper Arm (Left) - 63 minutes   DICTATION: .Other Dictation: Dictation Number [id number]  PLAN OF CARE: back to ICU  PATIENT DISPOSITION:  PACU - hemodynamically stable.   Delay start of Pharmacological VTE agent (>24hrs) due to surgical blood loss or risk of bleeding: N/A has epidural hematoma
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
## 2013-08-03 NOTE — Progress Notes (Signed)
pts continue to be in afib RVR HR-117-130's inspite of amiod drip 30mg /hour. Pt uses her PCA  for pain with relief. Dr. Salena Saner. End was notified with orders made. Will cont. to monitor.

## 2013-08-03 NOTE — Preoperative (Signed)
Beta Blockers   Reason not to administer Beta Blockers:Not Applicable, received metoprolol today per MAR.
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
## 2013-08-03 NOTE — Progress Notes (Signed)
Pts. HR maintains in afib HR >110's inspite of 2 doses of lopressor po 12.5mg , remains asymptomatic, afebrile,  Dr. Salena Saner. End made aware with order to give the increase dose of lopressor 25mg . now and continue to monitor.

## 2013-08-03 NOTE — Progress Notes (Signed)
Lab. called for critical value of hemoglobin-6.6 this am. Dr. Janee Morn made aware.

## 2013-08-03 NOTE — H&P (View-Only) (Signed)
Subjective: Day of Surgery Procedure(s) (LRB): OPEN REDUCTION INTERNAL FIXATION (ORIF) WRIST FRACTURE (Left) Patient reports pain as mild.    Objective: Vital signs in last 24 hours: Temp:  [97.6 F (36.4 C)-99.4 F (37.4 C)] 98.1 F (36.7 C) (12/23 0600) Pulse Rate:  [46-150] 140 (12/23 0700) Resp:  [15-30] 20 (12/23 0700) BP: (97-138)/(43-113) 113/79 mmHg (12/23 0700) SpO2:  [82 %-100 %] 96 % (12/23 0700) FiO2 (%):  [2 %] 2 % (12/23 0230)  Intake/Output from previous day: 12/22 0701 - 12/23 0700 In: 2639.9 [P.O.:120; I.V.:2519.9] Out: 1295 [Urine:1295] Intake/Output this shift:     Recent Labs  08/01/13 1351 08/02/13 0600 08/02/13 0835 08/03/13 0548  HGB 13.7 8.9* 8.4* 6.6*    Recent Labs  08/02/13 0835 08/03/13 0548  WBC 11.3* 11.6*  RBC 2.87* 2.24*  HCT 25.6* 20.0*  PLT 169 135*    Recent Labs  08/02/13 0835 08/03/13 0548  NA 140 141  K 4.3 3.9  CL 107 109  CO2 21 23  BUN 29* 21  CREATININE 0.86 0.68  GLUCOSE 191* 172*  CALCIUM 8.4 8.0*    Recent Labs  08/01/13 1313 08/02/13 0835  INR 2.53* 1.24    numbness in median nerve distribution without nerve compression type pain. no Hx of CTS in past.   Assessment/Plan: Day of Surgery Procedure(s) (LRB): OPEN REDUCTION INTERNAL FIXATION (ORIF) WRIST FRACTURE (Left) PLAN:     OR today for ORIF ofleft  radius and possibly ulna fractures  as long as CT does not show active bleeding problem in abdomen or pelvis etc.   Surgery planned at about 5 PM. Have discussed anesthesia options with Dr. Noreene Larsson yesterday.   2u PRBC ordered by trauma service.   Hari Casaus C 08/03/2013, 7:37 AM
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
## 2013-08-04 ENCOUNTER — Encounter (HOSPITAL_COMMUNITY): Payer: Self-pay | Admitting: Orthopaedic Surgery

## 2013-08-04 ENCOUNTER — Inpatient Hospital Stay (HOSPITAL_COMMUNITY): Payer: No Typology Code available for payment source

## 2013-08-04 DIAGNOSIS — G5790 Unspecified mononeuropathy of unspecified lower limb: Secondary | ICD-10-CM

## 2013-08-04 DIAGNOSIS — S064XAA Epidural hemorrhage with loss of consciousness status unknown, initial encounter: Secondary | ICD-10-CM

## 2013-08-04 DIAGNOSIS — S12200A Unspecified displaced fracture of third cervical vertebra, initial encounter for closed fracture: Secondary | ICD-10-CM

## 2013-08-04 DIAGNOSIS — I4891 Unspecified atrial fibrillation: Secondary | ICD-10-CM | POA: Insufficient documentation

## 2013-08-04 DIAGNOSIS — S62102A Fracture of unspecified carpal bone, left wrist, initial encounter for closed fracture: Secondary | ICD-10-CM

## 2013-08-04 DIAGNOSIS — D62 Acute posthemorrhagic anemia: Secondary | ICD-10-CM | POA: Diagnosis not present

## 2013-08-04 DIAGNOSIS — N179 Acute kidney failure, unspecified: Secondary | ICD-10-CM

## 2013-08-04 DIAGNOSIS — S36113A Laceration of liver, unspecified degree, initial encounter: Secondary | ICD-10-CM

## 2013-08-04 DIAGNOSIS — S2220XA Unspecified fracture of sternum, initial encounter for closed fracture: Secondary | ICD-10-CM

## 2013-08-04 DIAGNOSIS — S064X9A Epidural hemorrhage with loss of consciousness of unspecified duration, initial encounter: Secondary | ICD-10-CM

## 2013-08-04 DIAGNOSIS — J9600 Acute respiratory failure, unspecified whether with hypoxia or hypercapnia: Secondary | ICD-10-CM

## 2013-08-04 DIAGNOSIS — S2241XA Multiple fractures of ribs, right side, initial encounter for closed fracture: Secondary | ICD-10-CM

## 2013-08-04 DIAGNOSIS — D696 Thrombocytopenia, unspecified: Secondary | ICD-10-CM | POA: Diagnosis not present

## 2013-08-04 DIAGNOSIS — S272XXA Traumatic hemopneumothorax, initial encounter: Secondary | ICD-10-CM

## 2013-08-04 HISTORY — DX: Traumatic hemopneumothorax, initial encounter: S27.2XXA

## 2013-08-04 HISTORY — DX: Fracture of unspecified carpal bone, left wrist, initial encounter for closed fracture: S62.102A

## 2013-08-04 HISTORY — DX: Acute respiratory failure, unspecified whether with hypoxia or hypercapnia: J96.00

## 2013-08-04 HISTORY — DX: Acute posthemorrhagic anemia: D62

## 2013-08-04 HISTORY — DX: Multiple fractures of ribs, right side, initial encounter for closed fracture: S22.41XA

## 2013-08-04 HISTORY — DX: Unspecified fracture of sternum, initial encounter for closed fracture: S22.20XA

## 2013-08-04 HISTORY — DX: Unspecified displaced fracture of third cervical vertebra, initial encounter for closed fracture: S12.200A

## 2013-08-04 HISTORY — DX: Epidural hemorrhage with loss of consciousness status unknown, initial encounter: S06.4XAA

## 2013-08-04 HISTORY — DX: Unspecified mononeuropathy of unspecified lower limb: G57.90

## 2013-08-04 HISTORY — DX: Epidural hemorrhage with loss of consciousness of unspecified duration, initial encounter: S06.4X9A

## 2013-08-04 HISTORY — DX: Laceration of liver, unspecified degree, initial encounter: S36.113A

## 2013-08-04 LAB — CBC
HCT: 24.4 % — ABNORMAL LOW (ref 36.0–46.0)
HCT: 21.6 % — ABNORMAL LOW (ref 36.0–46.0)
Hemoglobin: 8.1 g/dL — ABNORMAL LOW (ref 12.0–15.0)
Hemoglobin: 7.1 g/dL — ABNORMAL LOW (ref 12.0–15.0)
MCH: 29.9 pg (ref 26.0–34.0)
MCH: 29.5 pg (ref 26.0–34.0)
MCHC: 33.2 g/dL (ref 30.0–36.0)
MCHC: 32.9 g/dL (ref 30.0–36.0)
MCV: 90 fL (ref 78.0–100.0)
MCV: 89.6 fL (ref 78.0–100.0)
Platelets: 95 10*3/uL — ABNORMAL LOW (ref 150–400)
Platelets: 120 10*3/uL — ABNORMAL LOW (ref 150–400)
RBC: 2.71 MIL/uL — ABNORMAL LOW (ref 3.87–5.11)
RBC: 2.41 MIL/uL — ABNORMAL LOW (ref 3.87–5.11)
RDW: 15.4 % (ref 11.5–15.5)
RDW: 15.3 % (ref 11.5–15.5)
WBC: 11.2 10*3/uL — ABNORMAL HIGH (ref 4.0–10.5)
WBC: 10.7 10*3/uL — ABNORMAL HIGH (ref 4.0–10.5)

## 2013-08-04 LAB — BASIC METABOLIC PANEL
BUN: 11 mg/dL (ref 6–23)
CO2: 26 mEq/L (ref 19–32)
Calcium: 7.9 mg/dL — ABNORMAL LOW (ref 8.4–10.5)
Chloride: 110 mEq/L (ref 96–112)
Creatinine, Ser: 0.53 mg/dL (ref 0.50–1.10)
GFR calc Af Amer: >90 mL/min (ref >90)
GFR calc non Af Amer: >90 mL/min (ref >90)
Glucose, Bld: 136 mg/dL — ABNORMAL HIGH (ref 70–99)
Potassium: 3.6 mEq/L (ref 3.5–5.1)
Sodium: 144 mEq/L (ref 135–145)

## 2013-08-04 LAB — PREPARE RBC (CROSSMATCH)

## 2013-08-04 MED ORDER — DILTIAZEM HCL 30 MG PO TABS
30.0000 mg | ORAL_TABLET | Freq: Four times a day (QID) | ORAL | Status: DC
  Administered 2013-08-04 – 2013-08-06 (×12): 30 mg via ORAL
  Filled 2013-08-04 (×17): qty 1

## 2013-08-04 MED ORDER — TRAMADOL HCL 50 MG PO TABS
100.0000 mg | ORAL_TABLET | Freq: Four times a day (QID) | ORAL | Status: DC
  Administered 2013-08-04 (×3): 100 mg via ORAL
  Administered 2013-08-05: 50 mg via ORAL
  Administered 2013-08-05 – 2013-08-12 (×28): 100 mg via ORAL
  Filled 2013-08-04 (×32): qty 2

## 2013-08-04 MED ORDER — AMIODARONE HCL 200 MG PO TABS
400.0000 mg | ORAL_TABLET | Freq: Two times a day (BID) | ORAL | Status: DC
  Administered 2013-08-04 – 2013-08-13 (×20): 400 mg via ORAL
  Filled 2013-08-04 (×25): qty 2

## 2013-08-04 MED ORDER — HYDROMORPHONE HCL PF 1 MG/ML IJ SOLN
0.5000 mg | INTRAMUSCULAR | Status: DC | PRN
  Administered 2013-08-04 – 2013-08-09 (×4): 0.5 mg via INTRAVENOUS
  Filled 2013-08-04 (×4): qty 1

## 2013-08-04 MED ORDER — OXYCODONE HCL 5 MG PO TABS
5.0000 mg | ORAL_TABLET | ORAL | Status: DC | PRN
  Administered 2013-08-04 (×2): 10 mg via ORAL
  Administered 2013-08-04: 5 mg via ORAL
  Administered 2013-08-04: 10 mg via ORAL
  Administered 2013-08-05: 5 mg via ORAL
  Administered 2013-08-05 – 2013-08-06 (×3): 10 mg via ORAL
  Administered 2013-08-06 (×2): 5 mg via ORAL
  Administered 2013-08-06 (×3): 10 mg via ORAL
  Administered 2013-08-07: 15 mg via ORAL
  Administered 2013-08-07: 10 mg via ORAL
  Administered 2013-08-07 – 2013-08-08 (×6): 15 mg via ORAL
  Administered 2013-08-09: 10 mg via ORAL
  Administered 2013-08-09 (×3): 15 mg via ORAL
  Administered 2013-08-10: 10 mg via ORAL
  Filled 2013-08-04: qty 3
  Filled 2013-08-04: qty 2
  Filled 2013-08-04: qty 1
  Filled 2013-08-04: qty 3
  Filled 2013-08-04: qty 2
  Filled 2013-08-04 (×2): qty 3
  Filled 2013-08-04 (×2): qty 2
  Filled 2013-08-04: qty 1
  Filled 2013-08-04: qty 2
  Filled 2013-08-04: qty 1
  Filled 2013-08-04: qty 3
  Filled 2013-08-04 (×4): qty 2
  Filled 2013-08-04: qty 3
  Filled 2013-08-04: qty 2
  Filled 2013-08-04: qty 3
  Filled 2013-08-04: qty 1
  Filled 2013-08-04: qty 2
  Filled 2013-08-04 (×2): qty 3
  Filled 2013-08-04: qty 2
  Filled 2013-08-04: qty 3
  Filled 2013-08-04: qty 2
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
## 2013-08-04 NOTE — Progress Notes (Signed)
Rehab Admissions Coordinator Note:  Patient was screened by Clois Dupes for appropriateness for an Inpatient Acute Rehab Consult.  At this time, we are recommending Inpatient Rehab consult.  Clois Dupes 08/04/2013, 3:10 PM  I can be reached at [phone].
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
## 2013-08-04 NOTE — Procedures (Signed)
Extubation Procedure Note  Patient Details:   Name: [name] DOB: [birth year] MRN: [id number]   Airway Documentation:     Evaluation  O2 sats: stable throughout Complications: No apparent complications Patient did tolerate procedure well. Bilateral Breath Sounds: Rhonchi;Diminished Suctioning: Airway Yes Patient tolerated wean. MD ordered to extubate. Positive for cuff leak. Pt extubated to 4 Lpm nasal cannula. No signs of dyspnea or stridor. Patient resting comfortably at this time.   Ancil Boozer 08/04/2013, 10:27 AM
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
## 2013-08-04 NOTE — Progress Notes (Signed)
Extubate this AM. Doing well. F/U CBC with liver lac and large hematomas. Patient examined and I agree with the assessment and plan  [name], MD, MPH, FACS Pager: [phone]  08/04/2013 11:19 AM

## 2013-08-05 ENCOUNTER — Inpatient Hospital Stay (HOSPITAL_COMMUNITY): Payer: No Typology Code available for payment source

## 2013-08-05 LAB — TYPE AND SCREEN
ABO/RH(D): O POS
Antibody Screen: NEGATIVE
Unit division: 0
Unit division: 0
Unit division: 0

## 2013-08-05 LAB — CBC
HCT: 23.8 % — ABNORMAL LOW (ref 36.0–46.0)
Hemoglobin: 7.8 g/dL — ABNORMAL LOW (ref 12.0–15.0)
MCH: 29.4 pg (ref 26.0–34.0)
MCHC: 32.8 g/dL (ref 30.0–36.0)
MCV: 89.8 fL (ref 78.0–100.0)
Platelets: 122 10*3/uL — ABNORMAL LOW (ref 150–400)
RBC: 2.65 MIL/uL — ABNORMAL LOW (ref 3.87–5.11)
RDW: 15.9 % — ABNORMAL HIGH (ref 11.5–15.5)
WBC: 10.2 10*3/uL (ref 4.0–10.5)
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
## 2013-08-05 NOTE — Progress Notes (Signed)
Subjective: 2 Days Post-Op Procedure(s) (LRB): OPEN REDUCTION INTERNAL FIXATION (ORIF) WRIST FRACTURE (Left) Patient reports pain as moderate.   Moderate wrist pain Objective: Vital signs in last 24 hours: Temp:  [97.8 F (36.6 C)-98.7 F (37.1 C)] 98.7 F (37.1 C) (12/25 1153) Pulse Rate:  [60-83] 79 (12/25 1500) Resp:  [15-35] 22 (12/25 1500) BP: (108-150)/(42-78) 142/78 mmHg (12/25 1500) SpO2:  [83 %-100 %] 97 % (12/25 1500)  Intake/Output from previous day: 12/24 0701 - 12/25 0700 In: 3587.9 [P.O.:1200; I.V.:2050.4; Blood:337.5] Out: 955 [Urine:955] Intake/Output this shift: Total I/O In: 770 [P.O.:170; I.V.:600] Out: 840 [Urine:840]   Recent Labs  08/03/13 0548 08/03/13 1432 08/04/13 0413 08/04/13 1344 08/05/13 0421  HGB 6.6* 9.1* 8.1* 7.1* 7.8*    Recent Labs  08/04/13 1344 08/05/13 0421  WBC 10.7* 10.2  RBC 2.41* 2.65*  HCT 21.6* 23.8*  PLT 120* 122*    Recent Labs  08/03/13 0548 08/04/13 0413  NA 141 144  K 3.9 3.6  CL 109 110  CO2 23 26  BUN 21 11  CREATININE 0.68 0.53  GLUCOSE 172* 136*  CALCIUM 8.0* 7.9*   No results found for this basename: LABPT, INR,  in the last 72 hours  Neurologically intact  Assessment/Plan: 2 Days Post-Op Procedure(s) (LRB): OPEN REDUCTION INTERNAL FIXATION (ORIF) WRIST FRACTURE (Left) Plan : continue elvation sling for 24 more hrs. Then she can have down on pillow  [name] C 08/05/2013, 4:50 PM
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
## 2013-08-06 NOTE — Progress Notes (Signed)
PT Cancellation Note  Patient Details Name: [name] MRN: [id number] DOB: 06-Dec-1945   Cancelled Treatment:    Reason Eval/Treat Not Completed: Pain limiting ability to participate.   Jessalyn Hinojosa, Alison Murray 08/06/2013, 11:11 AM
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
## 2013-08-06 NOTE — Progress Notes (Signed)
UR completed. CIR consult pending.   Carlyle Lipa, RN BSN MHA CCM Trauma/Neuro ICU Case Manager [phone]

## 2013-08-06 NOTE — Progress Notes (Signed)
Patient ID: [name], female   DOB: 1945/10/08, 67 y.o.   MRN: [id number] Subjective: 3 Days Post-Op Procedure(s) (LRB): OPEN REDUCTION INTERNAL FIXATION (ORIF) WRIST FRACTURE (Left) Awake, alert and oriented x 4. Wife of Dr. Sharol Harness. Patient reports pain as mild.    Objective:   VITALS:  Temp:  [97.4 F (36.3 C)-98.6 F (37 C)] 98.2 F (36.8 C) (12/26 1940) Pulse Rate:  [50-75] 63 (12/26 1940) Resp:  [20-30] 30 (12/26 1940) BP: (118-147)/(46-80) 122/60 mmHg (12/26 1940) SpO2:  [90 %-100 %] 93 % (12/26 1940)  Neurologically intact ABD soft Neurovascular intact Sensation intact distally Incision: dressing C/D/I   LABS  Recent Labs  08/04/13 0413 08/04/13 1344 08/05/13 0421  HGB 8.1* 7.1* 7.8*  WBC 11.2* 10.7* 10.2  PLT 95* 120* 122*    Recent Labs  08/04/13 0413  NA 144  K 3.6  CL 110  CO2 26  BUN 11  CREATININE 0.53  GLUCOSE 136*   No results found for this basename: LABPT, INR,  in the last 72 hours   Assessment/Plan: 3 Days Post-Op Procedure(s) (LRB): OPEN REDUCTION INTERNAL FIXATION (ORIF) WRIST FRACTURE (Left)  Advance diet Up with therapy  NITKA,JAMES E 08/06/2013, 8:33 PM

## 2013-08-06 NOTE — Progress Notes (Signed)
L arm elevation sling d/c'd 08/06/13 at 16:30 (final 24 hours completed, see Dr. Ophelia Charter' note 12/25 at 16:50).  L arm remains slightly elevated on pillows, dressing clean dry & intact, fingers warm, pink w/ intact movement & sensation.

## 2013-08-07 DIAGNOSIS — S12200A Unspecified displaced fracture of third cervical vertebra, initial encounter for closed fracture: Secondary | ICD-10-CM

## 2013-08-07 DIAGNOSIS — S52609A Unspecified fracture of lower end of unspecified ulna, initial encounter for closed fracture: Secondary | ICD-10-CM

## 2013-08-07 DIAGNOSIS — S52509A Unspecified fracture of the lower end of unspecified radius, initial encounter for closed fracture: Secondary | ICD-10-CM

## 2013-08-07 DIAGNOSIS — S069X9A Unspecified intracranial injury with loss of consciousness of unspecified duration, initial encounter: Secondary | ICD-10-CM

## 2013-08-07 LAB — BASIC METABOLIC PANEL
BUN: 10 mg/dL (ref 6–23)
CO2: 27 mEq/L (ref 19–32)
Calcium: 8.2 mg/dL — ABNORMAL LOW (ref 8.4–10.5)
Chloride: 103 mEq/L (ref 96–112)
Creatinine, Ser: 0.43 mg/dL — ABNORMAL LOW (ref 0.50–1.10)
GFR calc Af Amer: >90 mL/min (ref >90)
GFR calc non Af Amer: >90 mL/min (ref >90)
Glucose, Bld: 112 mg/dL — ABNORMAL HIGH (ref 70–99)
Potassium: 3 mEq/L — ABNORMAL LOW (ref 3.5–5.1)
Sodium: 140 mEq/L (ref 135–145)

## 2013-08-07 LAB — CBC
HCT: 28.6 % — ABNORMAL LOW (ref 36.0–46.0)
Hemoglobin: 9.2 g/dL — ABNORMAL LOW (ref 12.0–15.0)
MCH: 29.7 pg (ref 26.0–34.0)
MCHC: 32.2 g/dL (ref 30.0–36.0)
MCV: 92.3 fL (ref 78.0–100.0)
Platelets: 181 10*3/uL (ref 150–400)
RBC: 3.1 MIL/uL — ABNORMAL LOW (ref 3.87–5.11)
RDW: 15.9 % — ABNORMAL HIGH (ref 11.5–15.5)
WBC: 11 10*3/uL — ABNORMAL HIGH (ref 4.0–10.5)

## 2013-08-07 MED ORDER — DILTIAZEM HCL ER COATED BEADS 240 MG PO CP24
240.0000 mg | ORAL_CAPSULE | Freq: Every day | ORAL | Status: DC
  Administered 2013-08-07 – 2013-08-16 (×10): 240 mg via ORAL
  Filled 2013-08-07 (×10): qty 1

## 2013-08-07 MED ORDER — METOPROLOL TARTRATE 50 MG PO TABS
50.0000 mg | ORAL_TABLET | Freq: Two times a day (BID) | ORAL | Status: DC
  Administered 2013-08-07 – 2013-08-08 (×3): 50 mg via ORAL
  Filled 2013-08-07 (×4): qty 1

## 2013-08-07 NOTE — Progress Notes (Addendum)
Clinical Social Work Department CLINICAL SOCIAL WORK PLACEMENT NOTE 08/07/2013  Patient:  [name], [name]  Account Number:  [id number] Admit date:  08/01/2013  Clinical Social Worker:  Samuella Bruin, Theresia Majors  Date/time:  08/07/2013 10:53 AM  Clinical Social Work is seeking post-discharge placement for this patient at the following level of care:   SKILLED NURSING   (*CSW will update this form in Epic as items are completed)     Patient/family provided with [facility] Department of Clinical Social Work's list of facilities offering this level of care within the geographic area requested by the patient (or if unable, by the patient's family).  08/07/2013  Patient/family informed of their freedom to choose among providers that offer the needed level of care, that participate in Medicare, Medicaid or managed care program needed by the patient, have an available bed and are willing to accept the patient.    Patient/family informed of [facility]' ownership interest in [facility], as well as of the fact that they are under no obligation to receive care at this facility.  PASARR submitted to EDS on 08/07/2013 PASARR number received from EDS on 08/07/2013  FL2 transmitted to all facilities in geographic area requested by pt/family on  08/07/2013 FL2 transmitted to all facilities within larger geographic area on   Patient informed that his/her managed care company has contracts with or will negotiate with  certain facilities, including the following:     Patient/family informed of bed offers received:  08/13/2013 Patient chooses bed at [place] Physician recommends and patient chooses bed at    Patient to be transferred to [facility] on 08/16/2013   Patient to be transferred to facility by  Ambulance  The following physician request were entered in Epic:   Additional Comments:  Samuella Bruin, MSW, LCSWA Clinical Social Worker [facility]  Dept. [phone]

## 2013-08-07 NOTE — Progress Notes (Addendum)
Clinical Social Work Department BRIEF PSYCHOSOCIAL ASSESSMENT 08/07/2013  Patient:  [name], [name]     Account Number:  [id number]     Admit date:  08/01/2013  Clinical Social Worker:  Hadley Pen  Date/Time:  08/07/2013 09:45 AM  Referred by:  CSW  Date Referred:  08/05/2013 Referred for  SNF Placement   Other Referral:   Interview type:  Patient Other interview type:    PSYCHOSOCIAL DATA Living Status:  ALONE Admitted from facility:   Level of care:   Primary support name:  Nonnie Done [phone] Primary support relationship to patient:  FRIEND Degree of support available:   Strong per patient    CURRENT CONCERNS Current Concerns  Post-Acute Placement   Other Concerns:    SOCIAL WORK ASSESSMENT / PLAN Weekend CSW received referral for patient assessment from CSW colleague. Weekend CSW met with patient and explained CSW role, patient was agreeable to speaking. CSW explained to patient that PT is recommending CIR, CSW explained exploring SNF as backup. Patient states that SNF is not desirable as she is a retired Mining engineer and does not have a positive view of SNF's, but gave CSW permission to fax out as a backup. Patient was visibly in pain during conversation, but had a positive attitude and was engaged in conversation. Also, CSW assessed for substance abuse treatment needs, patient denies any alcohol or illicit drug use. CSW thanked patient for her time, and informed patient that weekday CSW will be following up with her regarding disposition plans.   Assessment/plan status:  Information/Referral to [facility] Other assessment/ plan:   Information/referral to community resources:   Weekend CSW to fax patient information out to [employer]. SNF's as a backup to [facility].    PATIENT'S/FAMILY'S RESPONSE TO PLAN OF CARE: Patient would prefer CIR, as she does not want to go to SNF and states that her husband (deceased) did not have a good experience  with home health. Patient thanked CSW for assistance.    Samuella Bruin, MSW, LCSWA Clinical Social Worker [facility] Emergency Dept. [phone]

## 2013-08-07 NOTE — Progress Notes (Addendum)
Pt converted to Afib while turning to get on the BP. HR 100s-110s and BP 142/78MD notified. No new orders at the moment. Will continue to monitor.
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
## 2013-08-08 MED ORDER — POTASSIUM CHLORIDE CRYS ER 20 MEQ PO TBCR
40.0000 meq | EXTENDED_RELEASE_TABLET | Freq: Two times a day (BID) | ORAL | Status: DC
  Administered 2013-08-08 (×2): 40 meq via ORAL
  Filled 2013-08-08 (×4): qty 2

## 2013-08-08 MED ORDER — DOCUSATE SODIUM 100 MG PO CAPS
100.0000 mg | ORAL_CAPSULE | Freq: Two times a day (BID) | ORAL | Status: DC
  Administered 2013-08-08 (×2): 100 mg via ORAL
  Filled 2013-08-08 (×2): qty 1

## 2013-08-08 MED ORDER — METOPROLOL TARTRATE 50 MG PO TABS
50.0000 mg | ORAL_TABLET | Freq: Two times a day (BID) | ORAL | Status: DC
  Administered 2013-08-09 – 2013-08-14 (×11): 50 mg via ORAL
  Filled 2013-08-08 (×20): qty 1

## 2013-08-08 MED ORDER — POLYETHYLENE GLYCOL 3350 17 G PO PACK
17.0000 g | PACK | Freq: Every day | ORAL | Status: DC
  Administered 2013-08-08 – 2013-08-16 (×8): 17 g via ORAL
  Filled 2013-08-08 (×9): qty 1
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
## 2013-08-08 NOTE — Progress Notes (Signed)
Notified Dr. Tresa Endo that pt's HR has been sustaining in 40s-50s--pt asymptomatic. Plan of care -- parameters placed on cardiac medications. Renette Butters, Viona Gilmore
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
## 2013-08-09 DIAGNOSIS — E876 Hypokalemia: Secondary | ICD-10-CM

## 2013-08-09 HISTORY — DX: Hypokalemia: E87.6

## 2013-08-09 LAB — CBC
HCT: 29.5 % — ABNORMAL LOW (ref 36.0–46.0)
Hemoglobin: 9.6 g/dL — ABNORMAL LOW (ref 12.0–15.0)
MCH: 30.3 pg (ref 26.0–34.0)
MCHC: 32.5 g/dL (ref 30.0–36.0)
MCV: 93.1 fL (ref 78.0–100.0)
Platelets: 193 10*3/uL (ref 150–400)
RBC: 3.17 MIL/uL — ABNORMAL LOW (ref 3.87–5.11)
RDW: 17 % — ABNORMAL HIGH (ref 11.5–15.5)
WBC: 9.7 10*3/uL (ref 4.0–10.5)

## 2013-08-09 LAB — BASIC METABOLIC PANEL
BUN: 10 mg/dL (ref 6–23)
CO2: 31 mEq/L (ref 19–32)
Calcium: 8.1 mg/dL — ABNORMAL LOW (ref 8.4–10.5)
Chloride: 102 mEq/L (ref 96–112)
Creatinine, Ser: 0.42 mg/dL — ABNORMAL LOW (ref 0.50–1.10)
GFR calc Af Amer: >90 mL/min (ref >90)
GFR calc non Af Amer: >90 mL/min (ref >90)
Glucose, Bld: 103 mg/dL — ABNORMAL HIGH (ref 70–99)
Potassium: 3.1 mEq/L — ABNORMAL LOW (ref 3.5–5.1)
Sodium: 140 meq/L (ref 135–145)

## 2013-08-09 MED ORDER — DOCUSATE SODIUM 100 MG PO CAPS
200.0000 mg | ORAL_CAPSULE | Freq: Two times a day (BID) | ORAL | Status: DC
  Administered 2013-08-09 – 2013-08-16 (×14): 200 mg via ORAL
  Filled 2013-08-09 (×15): qty 2

## 2013-08-09 MED ORDER — BISACODYL 5 MG PO TBEC
10.0000 mg | DELAYED_RELEASE_TABLET | Freq: Every day | ORAL | Status: DC
  Administered 2013-08-09 – 2013-08-16 (×7): 10 mg via ORAL
  Filled 2013-08-09 (×10): qty 2

## 2013-08-09 MED ORDER — POTASSIUM CHLORIDE CRYS ER 20 MEQ PO TBCR
40.0000 meq | EXTENDED_RELEASE_TABLET | Freq: Three times a day (TID) | ORAL | Status: DC
  Administered 2013-08-09 – 2013-08-16 (×23): 40 meq via ORAL
  Filled 2013-08-09 (×27): qty 2

## 2013-08-09 NOTE — Progress Notes (Signed)
Orthopedic Tech Progress Note Patient Details:  [name] 1945-12-09 [id number]  Ortho Devices Type of Ortho Device: Ace wrap;Sugartong splint Ortho Device/Splint Location: lue Ortho Device/Splint Interventions: Application   Maanav Kassabian 08/09/2013, 4:28 PM
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
## 2013-08-09 NOTE — Progress Notes (Signed)
Subjective: 6 Days Post-Op Procedure(s) (LRB): OPEN REDUCTION INTERNAL FIXATION (ORIF) WRIST FRACTURE (Left) Patient reports pain as 4 on 0-10 scale and 5 on 0-10 scale.    Objective: Vital signs in last 24 hours: Temp:  [98 F (36.7 C)-98.4 F (36.9 C)] 98.1 F (36.7 C) (12/29 1200) Pulse Rate:  [55-76] 76 (12/29 1042) Resp:  [17-26] 22 (12/29 0346) BP: (93-131)/(53-72) 108/55 mmHg (12/29 1200) SpO2:  [88 %-100 %] 100 % (12/29 0346)  Intake/Output from previous day: 12/28 0701 - 12/29 0700 In: 840 [P.O.:840] Out: 650 [Urine:650] Intake/Output this shift: Total I/O In: 240 [P.O.:240] Out: 600 [Urine:600]   Recent Labs  08/07/13 0555 08/09/13 0901  HGB 9.2* 9.6*    Recent Labs  08/07/13 0555 08/09/13 0901  WBC 11.0* 9.7  RBC 3.10* 3.17*  HCT 28.6* 29.5*  PLT 181 193    Recent Labs  08/07/13 0555 08/09/13 0355  NA 140 140  K 3.0* 3.1*  CL 103 102  CO2 27 31  BUN 10 10  CREATININE 0.43* 0.42*  GLUCOSE 112* 103*  CALCIUM 8.2* 8.1*   No results found for this basename: LABPT, INR,  in the last 72 hours  Neurologically intact  Assessment/Plan: 6 Days Post-Op Procedure(s) (LRB): OPEN REDUCTION INTERNAL FIXATION (ORIF) WRIST FRACTURE (Left) Plan     dresssing and plaster splint removed. velcro splint ordered. Continue finger ROM.  Nessie Nong C 08/09/2013, 3:43 PM
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
## 2013-08-09 NOTE — Progress Notes (Signed)
Patient complained of hallucination with oxy 15 mg. Stated that pain med's make her hallucinate.
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
## 2013-08-09 NOTE — Progress Notes (Signed)
Patient being transferred to [facility] per MD order. Report called to Alona Bene, Charity fundraiser. Patient will transfer in bed. All VSS.

## 2013-08-09 NOTE — Progress Notes (Signed)
I met with pt and then discussed her care needs and rehab with her friend Minna Merritts, via conference call. We discussed inpt rehab admission vs SNF rehab depending on available assistance after rehab as well as [facility] insurance coverage for both. We are meeting tomorrow at 3 pm for final disposition planning 08/10/13. I will alert Trauma SW of plans. [phone]

## 2013-08-09 NOTE — Progress Notes (Signed)
Patient is sitting up eating.  Looks great.  This patient has been seen and I agree with the findings and treatment plan.  Marta Lamas. Gae Bon, MD, FACS [phone] (pager) [phone] (direct pager) Trauma Surgeon

## 2013-08-10 DIAGNOSIS — E876 Hypokalemia: Secondary | ICD-10-CM

## 2013-08-10 LAB — BASIC METABOLIC PANEL
BUN: 10 mg/dL (ref 6–23)
CO2: 28 mEq/L (ref 19–32)
Calcium: 8.4 mg/dL (ref 8.4–10.5)
Chloride: 101 mEq/L (ref 96–112)
Creatinine, Ser: 0.48 mg/dL — ABNORMAL LOW (ref 0.50–1.10)
GFR calc Af Amer: >90 mL/min (ref >90)
GFR calc non Af Amer: >90 mL/min (ref >90)
Glucose, Bld: 106 mg/dL — ABNORMAL HIGH (ref 70–99)
Potassium: 4.4 mEq/L (ref 3.7–5.3)
Sodium: 141 mEq/L (ref 137–147)

## 2013-08-10 MED ORDER — NAPROXEN 500 MG PO TABS
500.0000 mg | ORAL_TABLET | Freq: Two times a day (BID) | ORAL | Status: DC
  Administered 2013-08-10 – 2013-08-16 (×14): 500 mg via ORAL
  Filled 2013-08-10 (×16): qty 1

## 2013-08-10 NOTE — Progress Notes (Signed)
RN went into patient room to give ultram patient asked are those army dogs in here? Patient's is alert and oriented x4 no complaint of pain vitals within normal limits. MD ( Dr. Andrey Campanile) on call notified no further orders given.

## 2013-08-10 NOTE — Progress Notes (Signed)
Having hallucinations again - will D/C oxycodone.  I spoke to her family too. Patient examined and I agree with the assessment and plan CIR meeting today. [name], MD, MPH, FACS Pager: [phone]  08/10/2013 10:53 AM
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
## 2013-08-10 NOTE — Progress Notes (Signed)
PT Cancellation Note  Patient Details Name: [name] MRN: [id number] DOB: 04/27/1946   Cancelled Treatment:    Reason Eval/Treat Not Completed: Medical issues which prohibited therapy;Fatigue/lethargy limiting ability to participate. Patient just finishing bath with OT when I entered the room. Patient stated that she was too tired to do anything else today and that she was still hallucinating from meds given earlier. Patient stated she wanted to rest prior to meeting with CIR coordinator at 3pm. Will follow up with patient in AM   Gavynn Duvall, Adline Potter 08/10/2013, 12:54 PM

## 2013-08-11 LAB — BASIC METABOLIC PANEL
BUN: 11 mg/dL (ref 6–23)
CO2: 26 mEq/L (ref 19–32)
Calcium: 8.4 mg/dL (ref 8.4–10.5)
Chloride: 103 mEq/L (ref 96–112)
Creatinine, Ser: 0.51 mg/dL (ref 0.50–1.10)
GFR calc Af Amer: >90 mL/min (ref >90)
GFR calc non Af Amer: >90 mL/min (ref >90)
Glucose, Bld: 98 mg/dL (ref 70–99)
Potassium: 4.5 mEq/L (ref 3.7–5.3)
Sodium: 139 mEq/L (ref 137–147)

## 2013-08-11 NOTE — Progress Notes (Signed)
Agree with PTA for D/C to SNF.    [address] [place], [place] [phone]
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
## 2013-08-11 NOTE — Clinical Social Work Note (Signed)
CSW has sent clinical information to [facility] regarding pt's discharge disposition. SNF bed search also updated on pt's new insurance as of 08/12/2013. CSW to continue to follow and assist with discharge planning needs.  Darlyn Chamber, LCSWA Clinical Social Worker [phone]

## 2013-08-11 NOTE — Progress Notes (Signed)
Is going to need SNF. Hallucinations better with oxy D/Cd. WIll need SNF. Patient examined and I agree with the assessment and plan  [name], MD, MPH, FACS Pager: [phone]  08/11/2013 12:06 PM

## 2013-08-11 NOTE — Clinical Social Work Note (Signed)
CSW was provided update from CIR regarding pt disposition needs.  CSW will continue to follow and assist with disposition.  Vickii Penna, LCSWA [phone]  Clinical Social Work
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
## 2013-08-12 DIAGNOSIS — S62109A Fracture of unspecified carpal bone, unspecified wrist, initial encounter for closed fracture: Secondary | ICD-10-CM

## 2013-08-12 MED ORDER — TRAMADOL HCL 50 MG PO TABS
100.0000 mg | ORAL_TABLET | Freq: Four times a day (QID) | ORAL | Status: DC | PRN
  Administered 2013-08-12 – 2013-08-15 (×10): 100 mg via ORAL
  Filled 2013-08-12 (×10): qty 2

## 2013-08-12 NOTE — Progress Notes (Signed)
PT Cancellation Note  Patient Details Name: [name] MRN: [id number] DOB: 03/15/1946   Cancelled Treatment:    Reason Eval/Treat Not Completed: Pain limiting ability to participate. Patient declining therapy at this time. Patient complained of increased rib pain due to coughing all night. Encouraged to sit up with nursing staff later today if able. Will follow up with patient in AM   [name], [name] 08/12/2013, 9:25 AM
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
## 2013-08-13 NOTE — Progress Notes (Signed)
CSW spoke to patient and patient's friends by bedside and over the phone to give offers. Patient and patient's friend chose [facility]. CSW will update [employer] and is awaiting insurance authorization.  Maree KrabbeLindsay Jamiya Nims, MSW, Theresia MajorsLCSWA [phone]
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
## 2013-08-13 NOTE — Progress Notes (Signed)
Doing better. Hope for SNF placement today. Patient examined and I agree with the assessment and plan  Violeta GelinasBurke Anderson Coppock, MD, MPH, FACS Pager: [phone]  08/13/2013 10:07 AM

## 2013-08-14 MED ORDER — OXYCODONE HCL 5 MG PO TABS
10.0000 mg | ORAL_TABLET | ORAL | Status: DC | PRN
  Administered 2013-08-14 – 2013-08-16 (×7): 10 mg via ORAL
  Filled 2013-08-14 (×8): qty 2

## 2013-08-14 MED ORDER — AMIODARONE HCL 200 MG PO TABS
400.0000 mg | ORAL_TABLET | Freq: Every day | ORAL | Status: DC
Start: 2013-08-14 — End: 2013-08-16
  Administered 2013-08-14 – 2013-08-16 (×3): 400 mg via ORAL
  Filled 2013-08-14 (×2): qty 2
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
## 2013-08-15 NOTE — Progress Notes (Signed)
Patient ID: [name], female   DOB: 01/03/1946, 68 y.o.   MRN: [id number] Subjective: 12 Days Post-Op Procedure(s) (LRB): OPEN REDUCTION INTERNAL FIXATION (ORIF) WRIST FRACTURE (Left) Awake, alert and oriented x2. No complaints. C-collar intact. Left UE sugar tong splint intact.  Patient reports pain as mild.    Objective:   VITALS:  Temp:  [97.3 F (36.3 C)-98 F (36.7 C)] 97.3 F (36.3 C) (01/04 0607) Pulse Rate:  [56-58] 58 (01/04 0607) Resp:  [18-20] 18 (01/04 0607) BP: (108-145)/(61-69) 118/69 mmHg (01/04 0607) SpO2:  [95 %-100 %] 97 % (01/04 0607)  Neurologically intact ABD soft Neurovascular intact Sensation intact distally Intact pulses distally Incision: dressing C/D/I   LABS No results found for this basename: HGB, WBC, PLT,  in the last 72 hours No results found for this basename: NA, K, CL, CO2, BUN, CREATININE, GLUCOSE,  in the last 72 hours No results found for this basename: LABPT, INR,  in the last 72 hours   Assessment/Plan: 12 Days Post-Op Procedure(s) (LRB): OPEN REDUCTION INTERNAL FIXATION (ORIF) WRIST FRACTURE (Left)  Advance diet Up with therapy Discharge to SNF [facility] 08/16/2013. NITKA,JAMES E 08/15/2013, 8:42 AM

## 2013-08-16 DIAGNOSIS — I480 Paroxysmal atrial fibrillation: Secondary | ICD-10-CM

## 2013-08-16 DIAGNOSIS — S272XXA Traumatic hemopneumothorax, initial encounter: Secondary | ICD-10-CM

## 2013-08-16 MED ORDER — AMIODARONE HCL 400 MG PO TABS: 200.0000 mg | ORAL_TABLET | Freq: Every day | ORAL | Status: DC

## 2013-08-16 MED ORDER — DSS 100 MG PO CAPS: 200.0000 mg | ORAL_CAPSULE | Freq: Two times a day (BID) | ORAL | Status: DC

## 2013-08-16 MED ORDER — OXYCODONE HCL 10 MG PO TABS: 10.0000 mg | ORAL_TABLET | ORAL | Status: DC | PRN

## 2013-08-16 MED ORDER — TRAMADOL HCL 50 MG PO TABS: 100.0000 mg | ORAL_TABLET | Freq: Four times a day (QID) | ORAL | Status: DC | PRN

## 2013-08-16 MED ORDER — METOPROLOL TARTRATE 50 MG PO TABS: 50.0000 mg | ORAL_TABLET | Freq: Two times a day (BID) | ORAL | Status: DC

## 2013-08-16 MED ORDER — NAPROXEN 500 MG PO TABS: 500.0000 mg | ORAL_TABLET | Freq: Two times a day (BID) | ORAL | Status: DC

## 2013-08-16 MED ORDER — BISACODYL 5 MG PO TBEC: 10.0000 mg | DELAYED_RELEASE_TABLET | Freq: Every day | ORAL | Status: DC

## 2013-08-16 MED ORDER — POTASSIUM CHLORIDE CRYS ER 20 MEQ PO TBCR: 40.0000 meq | EXTENDED_RELEASE_TABLET | Freq: Two times a day (BID) | ORAL | Status: DC

## 2013-08-16 NOTE — Clinical Social Work Note (Signed)
Clinical Social Worker facilitated patient discharge including contacting patient family and facility to confirm patient discharge plans.  [facility] ([facility] effective 08/12/2013).  Clinical information faxed to facility and family agreeable with plan.  CSW [facility].  RN to call report prior to discharge.  Clinical Social Worker will sign off for now as social work intervention is no longer needed. Please consult us again if new need arises.  Macario GoldsJesse Montell Leopard, KentuckyLCSW [phone]

## 2013-08-16 NOTE — Progress Notes (Signed)
Cardiac rhythm strip appears to be more SVT with aberrant conduction.  Not VT.  Okay to be discharged.  This patient has been seen and I agree with the findings and treatment plan.  Marta LamasJames O. Gae BonWyatt, III, MD, FACS [phone]) [phone] (direct pager) Trauma Surgeon
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
## 2013-08-16 NOTE — Progress Notes (Signed)
At 1630, [facility] Patient packed up, aware of transfer. Discharge in place. Sherlyn Leeseleha, Blakley Michna S, RN
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
## 2013-08-16 NOTE — Progress Notes (Signed)
Pt discharged from unit at 2025. Discharged instructions given with voiced understanding.

## 2013-08-17 ENCOUNTER — Encounter: Payer: Self-pay | Admitting: Adult Health

## 2013-08-17 ENCOUNTER — Non-Acute Institutional Stay (SKILLED_NURSING_FACILITY): Payer: Medicare HMO | Admitting: Adult Health

## 2013-08-17 ENCOUNTER — Telehealth (HOSPITAL_COMMUNITY): Payer: Self-pay | Admitting: Emergency Medicine

## 2013-08-17 DIAGNOSIS — S064X0D Epidural hemorrhage without loss of consciousness, subsequent encounter: Secondary | ICD-10-CM

## 2013-08-17 DIAGNOSIS — I4891 Unspecified atrial fibrillation: Secondary | ICD-10-CM

## 2013-08-17 DIAGNOSIS — IMO0001 Reserved for inherently not codable concepts without codable children: Secondary | ICD-10-CM

## 2013-08-17 DIAGNOSIS — E876 Hypokalemia: Secondary | ICD-10-CM

## 2013-08-17 DIAGNOSIS — I480 Paroxysmal atrial fibrillation: Secondary | ICD-10-CM

## 2013-08-17 DIAGNOSIS — S5290XD Unspecified fracture of unspecified forearm, subsequent encounter for closed fracture with routine healing: Secondary | ICD-10-CM

## 2013-08-17 DIAGNOSIS — S2220XD Unspecified fracture of sternum, subsequent encounter for fracture with routine healing: Secondary | ICD-10-CM

## 2013-08-17 DIAGNOSIS — S62102D Fracture of unspecified carpal bone, left wrist, subsequent encounter for fracture with routine healing: Secondary | ICD-10-CM

## 2013-08-17 DIAGNOSIS — Z5189 Encounter for other specified aftercare: Secondary | ICD-10-CM

## 2013-08-17 DIAGNOSIS — S2241XD Multiple fractures of ribs, right side, subsequent encounter for fracture with routine healing: Secondary | ICD-10-CM

## 2013-08-17 DIAGNOSIS — R112 Nausea with vomiting, unspecified: Secondary | ICD-10-CM | POA: Insufficient documentation

## 2013-08-17 DIAGNOSIS — IMO0002 Reserved for concepts with insufficient information to code with codable children: Secondary | ICD-10-CM

## 2013-08-17 DIAGNOSIS — S12200G Unspecified displaced fracture of third cervical vertebra, subsequent encounter for fracture with delayed healing: Secondary | ICD-10-CM

## 2013-08-17 DIAGNOSIS — S36113A Laceration of liver, unspecified degree, initial encounter: Secondary | ICD-10-CM

## 2013-08-17 MED ORDER — ONDANSETRON HCL 8 MG PO TABS: 4.0000 mg | ORAL_TABLET | Freq: Four times a day (QID) | ORAL | Status: DC | PRN
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
## 2013-08-17 NOTE — Telephone Encounter (Signed)
Talked with NP about patients coumadin.  We were going to restart her coumadin at discharge after discussing it with Dr. Lindie SpruceWyatt.  However; Dr Jens Somrenshaw wrote the following in his notes:  She remains in sinus rhythm. Would continue amiodarone 400 mg daily for now. At discharge change to 200 mg daily. Continue diltiazem and metoprolol. Followup with Dr. Anne FuSkains 4-6 weeks after discharge. He will consider resuming anticoagulation when she recovers from her surgery. We will sign off. Please call with questions.   Will defer this to him, but from our perspective she could be restarted on her coumadin if H&H is monitored.

## 2013-08-23 ENCOUNTER — Other Ambulatory Visit: Payer: Self-pay | Admitting: *Deleted

## 2013-08-23 ENCOUNTER — Encounter: Payer: Self-pay | Admitting: Internal Medicine

## 2013-08-23 ENCOUNTER — Non-Acute Institutional Stay (SKILLED_NURSING_FACILITY): Payer: Medicare HMO | Admitting: Internal Medicine

## 2013-08-23 DIAGNOSIS — S62102S Fracture of unspecified carpal bone, left wrist, sequela: Secondary | ICD-10-CM

## 2013-08-23 DIAGNOSIS — R5383 Other fatigue: Secondary | ICD-10-CM

## 2013-08-23 DIAGNOSIS — IMO0002 Reserved for concepts with insufficient information to code with codable children: Secondary | ICD-10-CM

## 2013-08-23 DIAGNOSIS — R531 Weakness: Secondary | ICD-10-CM | POA: Insufficient documentation

## 2013-08-23 DIAGNOSIS — S2249XA Multiple fractures of ribs, unspecified side, initial encounter for closed fracture: Secondary | ICD-10-CM | POA: Insufficient documentation

## 2013-08-23 DIAGNOSIS — R112 Nausea with vomiting, unspecified: Secondary | ICD-10-CM

## 2013-08-23 DIAGNOSIS — S42309S Unspecified fracture of shaft of humerus, unspecified arm, sequela: Secondary | ICD-10-CM

## 2013-08-23 DIAGNOSIS — R5381 Other malaise: Secondary | ICD-10-CM

## 2013-08-23 DIAGNOSIS — I4891 Unspecified atrial fibrillation: Secondary | ICD-10-CM

## 2013-08-23 DIAGNOSIS — E876 Hypokalemia: Secondary | ICD-10-CM

## 2013-08-23 DIAGNOSIS — S2241XS Multiple fractures of ribs, right side, sequela: Secondary | ICD-10-CM

## 2013-08-23 MED ORDER — TRAMADOL HCL 50 MG PO TABS: ORAL_TABLET | ORAL | Status: DC
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
## 2013-08-24 ENCOUNTER — Encounter: Payer: Self-pay | Admitting: Cardiology

## 2013-08-24 ENCOUNTER — Ambulatory Visit (INDEPENDENT_AMBULATORY_CARE_PROVIDER_SITE_OTHER): Payer: Medicare HMO | Admitting: Cardiology

## 2013-08-24 VITALS — BP 110/60 | HR 56 | Ht 66.0 in | Wt 235.0 lb

## 2013-08-24 DIAGNOSIS — I4891 Unspecified atrial fibrillation: Secondary | ICD-10-CM
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
## 2013-08-24 NOTE — Patient Instructions (Signed)
Your physician recommends that you continue on your current medications as directed. Please refer to the Current Medication list given to you today.  Your physician wants you to follow-up in: 6 months with Dr. Skains. You will receive a reminder letter in the mail two months in advance. If you don't receive a letter, please call our office to schedule the follow-up appointment.  

## 2013-09-09 ENCOUNTER — Encounter: Payer: Medicare Other | Admitting: Cardiology

## 2013-09-13 ENCOUNTER — Non-Acute Institutional Stay (SKILLED_NURSING_FACILITY): Payer: Medicare HMO | Admitting: Internal Medicine

## 2013-09-13 ENCOUNTER — Encounter: Payer: Self-pay | Admitting: Internal Medicine

## 2013-09-13 DIAGNOSIS — R5383 Other fatigue: Secondary | ICD-10-CM

## 2013-09-13 DIAGNOSIS — S62109A Fracture of unspecified carpal bone, unspecified wrist, initial encounter for closed fracture: Secondary | ICD-10-CM

## 2013-09-13 DIAGNOSIS — R5381 Other malaise: Secondary | ICD-10-CM

## 2013-09-13 DIAGNOSIS — S064X9A Epidural hemorrhage with loss of consciousness of unspecified duration, initial encounter: Secondary | ICD-10-CM

## 2013-09-13 DIAGNOSIS — S62102A Fracture of unspecified carpal bone, left wrist, initial encounter for closed fracture: Secondary | ICD-10-CM

## 2013-09-13 DIAGNOSIS — R531 Weakness: Secondary | ICD-10-CM

## 2013-09-13 DIAGNOSIS — I4891 Unspecified atrial fibrillation: Secondary | ICD-10-CM

## 2013-09-13 DIAGNOSIS — S064XAA Epidural hemorrhage with loss of consciousness status unknown, initial encounter: Secondary | ICD-10-CM
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
## 2013-09-22 ENCOUNTER — Encounter: Payer: Self-pay | Admitting: Adult Health

## 2013-09-22 ENCOUNTER — Non-Acute Institutional Stay (SKILLED_NURSING_FACILITY): Payer: Medicare HMO | Admitting: Adult Health

## 2013-09-22 DIAGNOSIS — I4891 Unspecified atrial fibrillation: Secondary | ICD-10-CM

## 2013-09-22 DIAGNOSIS — I480 Paroxysmal atrial fibrillation: Secondary | ICD-10-CM

## 2013-09-22 DIAGNOSIS — S12200A Unspecified displaced fracture of third cervical vertebra, initial encounter for closed fracture: Secondary | ICD-10-CM

## 2013-09-22 DIAGNOSIS — S62109A Fracture of unspecified carpal bone, unspecified wrist, initial encounter for closed fracture: Secondary | ICD-10-CM

## 2013-09-22 DIAGNOSIS — S064X9A Epidural hemorrhage with loss of consciousness of unspecified duration, initial encounter: Secondary | ICD-10-CM

## 2013-09-22 DIAGNOSIS — G5790 Unspecified mononeuropathy of unspecified lower limb: Secondary | ICD-10-CM

## 2013-09-22 DIAGNOSIS — S62102A Fracture of unspecified carpal bone, left wrist, initial encounter for closed fracture: Secondary | ICD-10-CM

## 2013-09-22 DIAGNOSIS — S064XAA Epidural hemorrhage with loss of consciousness status unknown, initial encounter: Secondary | ICD-10-CM
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
## 2013-10-04 ENCOUNTER — Telehealth: Payer: Self-pay | Admitting: Cardiology
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
## 2013-10-12 NOTE — Telephone Encounter (Signed)
Patient doesn't recall calling the office.

## 2013-10-13 ENCOUNTER — Encounter (HOSPITAL_BASED_OUTPATIENT_CLINIC_OR_DEPARTMENT_OTHER): Payer: Self-pay | Admitting: Emergency Medicine

## 2013-10-13 ENCOUNTER — Observation Stay (HOSPITAL_BASED_OUTPATIENT_CLINIC_OR_DEPARTMENT_OTHER)
Admission: EM | Admit: 2013-10-13 | Discharge: 2013-10-15 | Disposition: A | Discharge status: Home / Self Care | Payer: Medicare HMO | Attending: Internal Medicine | Admitting: Internal Medicine

## 2013-10-13 DIAGNOSIS — R319 Hematuria, unspecified: Secondary | ICD-10-CM

## 2013-10-13 DIAGNOSIS — D6859 Other primary thrombophilia: Secondary | ICD-10-CM | POA: Diagnosis present

## 2013-10-13 DIAGNOSIS — Z79899 Other long term (current) drug therapy: Secondary | ICD-10-CM | POA: Insufficient documentation

## 2013-10-13 DIAGNOSIS — S12200A Unspecified displaced fracture of third cervical vertebra, initial encounter for closed fracture: Secondary | ICD-10-CM

## 2013-10-13 DIAGNOSIS — Z9089 Acquired absence of other organs: Secondary | ICD-10-CM | POA: Insufficient documentation

## 2013-10-13 DIAGNOSIS — N390 Urinary tract infection, site not specified: Secondary | ICD-10-CM | POA: Insufficient documentation

## 2013-10-13 DIAGNOSIS — E876 Hypokalemia: Secondary | ICD-10-CM

## 2013-10-13 DIAGNOSIS — R791 Abnormal coagulation profile: Secondary | ICD-10-CM | POA: Insufficient documentation

## 2013-10-13 DIAGNOSIS — R310 Gross hematuria: Principal | ICD-10-CM | POA: Insufficient documentation

## 2013-10-13 DIAGNOSIS — Z96659 Presence of unspecified artificial knee joint: Secondary | ICD-10-CM | POA: Insufficient documentation

## 2013-10-13 DIAGNOSIS — I4891 Unspecified atrial fibrillation: Secondary | ICD-10-CM | POA: Insufficient documentation

## 2013-10-13 DIAGNOSIS — D689 Coagulation defect, unspecified: Secondary | ICD-10-CM

## 2013-10-13 DIAGNOSIS — E43 Unspecified severe protein-calorie malnutrition: Secondary | ICD-10-CM | POA: Insufficient documentation

## 2013-10-13 DIAGNOSIS — Z7901 Long term (current) use of anticoagulants: Secondary | ICD-10-CM | POA: Insufficient documentation

## 2013-10-13 LAB — URINE MICROSCOPIC-ADD ON

## 2013-10-13 LAB — BASIC METABOLIC PANEL
BUN: 8 mg/dL (ref 6–23)
CO2: 24 mEq/L (ref 19–32)
Calcium: 9.6 mg/dL (ref 8.4–10.5)
Chloride: 98 mEq/L (ref 96–112)
Creatinine, Ser: 0.8 mg/dL (ref 0.50–1.10)
GFR calc Af Amer: 86 mL/min — ABNORMAL LOW (ref >90)
GFR calc non Af Amer: 74 mL/min — ABNORMAL LOW (ref >90)
Glucose, Bld: 140 mg/dL — ABNORMAL HIGH (ref 70–99)
Potassium: 3.3 mEq/L — ABNORMAL LOW (ref 3.7–5.3)
Sodium: 141 mEq/L (ref 137–147)

## 2013-10-13 LAB — URINALYSIS, ROUTINE W REFLEX MICROSCOPIC
Glucose, UA: NEGATIVE mg/dL
Ketones, ur: 15 mg/dL — AB
Nitrite: POSITIVE — AB
Protein, ur: >300 mg/dL — AB
Specific Gravity, Urine: 1.014 (ref 1.005–1.030)
Urobilinogen, UA: 1 mg/dL (ref 0.0–1.0)
pH: 6 (ref 5.0–8.0)

## 2013-10-13 LAB — PROTIME-INR
INR: 10.74 (ref 0.00–1.49)
Prothrombin Time: 79.6 seconds — ABNORMAL HIGH (ref 11.6–15.2)

## 2013-10-13 LAB — CBC WITH DIFFERENTIAL/PLATELET
Basophils Absolute: 0 10*3/uL (ref 0.0–0.1)
Basophils Relative: 0 % (ref 0–1)
Eosinophils Absolute: 0.1 10*3/uL (ref 0.0–0.7)
Eosinophils Relative: 1 % (ref 0–5)
HCT: 39.9 % (ref 36.0–46.0)
Hemoglobin: 13 g/dL (ref 12.0–15.0)
Lymphocytes Relative: 18 % (ref 12–46)
Lymphs Abs: 1.5 10*3/uL (ref 0.7–4.0)
MCH: 29.2 pg (ref 26.0–34.0)
MCHC: 32.6 g/dL (ref 30.0–36.0)
MCV: 89.7 fL (ref 78.0–100.0)
Monocytes Absolute: 1 10*3/uL (ref 0.1–1.0)
Monocytes Relative: 11 % (ref 3–12)
Neutro Abs: 5.8 10*3/uL (ref 1.7–7.7)
Neutrophils Relative %: 70 % (ref 43–77)
Platelets: 317 10*3/uL (ref 150–400)
RBC: 4.45 MIL/uL (ref 3.87–5.11)
RDW: 15.8 % — ABNORMAL HIGH (ref 11.5–15.5)
WBC: 8.4 10*3/uL (ref 4.0–10.5)

## 2013-10-13 MED ORDER — VITAMIN K1 10 MG/ML IJ SOLN
10.0000 mg | Freq: Once | INTRAVENOUS | Status: AC
  Administered 2013-10-13: 10 mg via INTRAVENOUS

## 2013-10-13 MED ORDER — VITAMIN K1 10 MG/ML IJ SOLN
INTRAMUSCULAR | Status: AC
  Administered 2013-10-13: 10 mg
  Filled 2013-10-13: qty 1

## 2013-10-13 NOTE — ED Notes (Signed)
Pt sts [facility] PCP performed a urinalysis at visit today. [facility] called to get those results faxed to our dept.

## 2013-10-13 NOTE — ED Notes (Signed)
EDPA at bedside.-pt notified of need for admn

## 2013-10-13 NOTE — Progress Notes (Signed)
[facility].  Hematuria. INR >10 on coumadin for a fib. HD stable. Lives alone and concern about confusion per EDP.  [facility]. Given 10 mg vitamin K in ED. PCP is [facility].  Call flow manager on [facility]  Crista Curborinna Ellsie Violette, M.D.
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
## 2013-10-14 ENCOUNTER — Encounter (HOSPITAL_COMMUNITY): Payer: Self-pay | Admitting: *Deleted

## 2013-10-14 DIAGNOSIS — D6859 Other primary thrombophilia: Secondary | ICD-10-CM

## 2013-10-14 DIAGNOSIS — R310 Gross hematuria: Secondary | ICD-10-CM

## 2013-10-14 DIAGNOSIS — I4891 Unspecified atrial fibrillation: Secondary | ICD-10-CM

## 2013-10-14 DIAGNOSIS — E43 Unspecified severe protein-calorie malnutrition: Secondary | ICD-10-CM | POA: Diagnosis present

## 2013-10-14 DIAGNOSIS — N390 Urinary tract infection, site not specified: Secondary | ICD-10-CM | POA: Diagnosis present

## 2013-10-14 DIAGNOSIS — R319 Hematuria, unspecified: Secondary | ICD-10-CM

## 2013-10-14 LAB — URINALYSIS, ROUTINE W REFLEX MICROSCOPIC
Bilirubin Urine: NEGATIVE
Glucose, UA: NEGATIVE mg/dL
Ketones, ur: NEGATIVE mg/dL
Nitrite: NEGATIVE
Protein, ur: 30 mg/dL — AB
Specific Gravity, Urine: 1.011 (ref 1.005–1.030)
Urobilinogen, UA: 2 mg/dL — ABNORMAL HIGH (ref 0.0–1.0)
pH: 7.5 (ref 5.0–8.0)

## 2013-10-14 LAB — PROTIME-INR
INR: 1.7 — ABNORMAL HIGH (ref 0.00–1.49)
INR: 1.36 (ref 0.00–1.49)
Prothrombin Time: 19.5 seconds — ABNORMAL HIGH (ref 11.6–15.2)
Prothrombin Time: 16.4 seconds — ABNORMAL HIGH (ref 11.6–15.2)

## 2013-10-14 LAB — HEMOGLOBIN AND HEMATOCRIT, BLOOD
HCT: 34.8 % — ABNORMAL LOW (ref 36.0–46.0)
Hemoglobin: 11.2 g/dL — ABNORMAL LOW (ref 12.0–15.0)

## 2013-10-14 LAB — CBC
HCT: 37.5 % (ref 36.0–46.0)
Hemoglobin: 12.1 g/dL (ref 12.0–15.0)
MCH: 28.7 pg (ref 26.0–34.0)
MCHC: 32.3 g/dL (ref 30.0–36.0)
MCV: 88.9 fL (ref 78.0–100.0)
Platelets: 256 10*3/uL (ref 150–400)
RBC: 4.22 MIL/uL (ref 3.87–5.11)
RDW: 16.1 % — ABNORMAL HIGH (ref 11.5–15.5)
WBC: 8.8 10*3/uL (ref 4.0–10.5)

## 2013-10-14 LAB — URINE MICROSCOPIC-ADD ON

## 2013-10-14 MED ORDER — WARFARIN SODIUM 5 MG PO TABS
5.0000 mg | ORAL_TABLET | Freq: Once | ORAL | Status: AC
  Administered 2013-10-14: 5 mg via ORAL
  Filled 2013-10-14: qty 1

## 2013-10-14 MED ORDER — BOOST / RESOURCE BREEZE PO LIQD
1.0000 | Freq: Two times a day (BID) | ORAL | Status: DC
  Administered 2013-10-14: 1 via ORAL

## 2013-10-14 MED ORDER — SODIUM CHLORIDE 0.9 % IJ SOLN
3.0000 mL | Freq: Two times a day (BID) | INTRAMUSCULAR | Status: DC
  Administered 2013-10-14 – 2013-10-15 (×3): 3 mL via INTRAVENOUS

## 2013-10-14 MED ORDER — SODIUM CHLORIDE 0.9 % IV SOLN: 250.0000 mL | INTRAVENOUS | Status: DC | PRN

## 2013-10-14 MED ORDER — DEXTROSE 5 % IV SOLN
1.0000 g | Freq: Every day | INTRAVENOUS | Status: DC
  Administered 2013-10-14 (×2): 1 g via INTRAVENOUS
  Filled 2013-10-14 (×3): qty 10

## 2013-10-14 MED ORDER — ACETAMINOPHEN 325 MG PO TABS
650.0000 mg | ORAL_TABLET | Freq: Four times a day (QID) | ORAL | Status: DC | PRN
  Administered 2013-10-14: 650 mg via ORAL
  Filled 2013-10-14: qty 2

## 2013-10-14 MED ORDER — SODIUM CHLORIDE 0.9 % IJ SOLN: 3.0000 mL | INTRAMUSCULAR | Status: DC | PRN

## 2013-10-14 MED ORDER — WARFARIN - PHARMACIST DOSING INPATIENT
Freq: Every day | Status: DC
  Administered 2013-10-14: 18:00:00

## 2013-10-14 MED ORDER — DILTIAZEM HCL ER BEADS 240 MG PO CP24
240.0000 mg | ORAL_CAPSULE | Freq: Every day | ORAL | Status: DC
  Administered 2013-10-14 – 2013-10-15 (×2): 240 mg via ORAL
  Filled 2013-10-14 (×2): qty 1

## 2013-10-14 MED ORDER — METOPROLOL TARTRATE 50 MG PO TABS
50.0000 mg | ORAL_TABLET | Freq: Two times a day (BID) | ORAL | Status: DC
  Administered 2013-10-14 – 2013-10-15 (×3): 50 mg via ORAL
  Filled 2013-10-14 (×5): qty 1

## 2013-10-14 NOTE — Progress Notes (Signed)
UR completed 
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
## 2013-10-14 NOTE — Progress Notes (Signed)
Pt alert and oriented, oriented to room and call bell. Pt instructed to call for assistance if needing to get out of bed. Bed alarm on.  Pt states sacrum is painful and has been since she left rehab. RN observed nonblanchable redness to intergluteal fold and small open area to the right buttock. Dsg applied. Dsg applied to bilateral heels to healing pressure ulcers that are scabbed over and dry, no tissue exposed. Pt states she wears shoes in bed for foot pain. She has not had a BM in a few days. She is a widow and lives alone and seems to rely on friends and her church for support. She states a home healthcare nurse came to her house one time since she has been out of rehab but no one comes regularly. MD on call and flow manager have been paged- no orders as of yet. Will continue to monitor.

## 2013-10-14 NOTE — Progress Notes (Signed)
Came to visit patient at bedside to offer [facility] services. Pharmacy at bedside currently. Will come back at later time.  Raiford NobleAtika Celestino Ackerman, MSN- RN, BSN- [facility] Care Management Hospital [phone]

## 2013-10-14 NOTE — ED Provider Notes (Signed)
Medical screening examination/treatment/procedure(s) were conducted as a shared visit with non-physician practitioner(s) and myself.  I personally evaluated the patient during the encounter.   EKG Interpretation   Date/Time:  Wednesday October 13 2013 16:26:15 EST Ventricular Rate:  65 PR Interval:  156 QRS Duration: 96 QT Interval:  466 QTC Calculation: 484 R Axis:   76 Text Interpretation:  Normal sinus rhythm Inverted t waves inferiorly  Prolonged QT Abnormal ECG Confirmed by Fayrene FearingJAMES  MD, MARK ([id number]) on 10/13/2013  11:22:01 PM     Patient arrived at the end of my shift. I had spoken to her PCP  on the phone and patient has an INR of 8 in the office. Mental status fine and was to be referred to our ED and brought by friend. At the end of my shift patient had arrived but repeat labs were pending. No bleeding history except for blood in the urine. Patient now lives at home alone and was discharged from rehab facility 3 weeks ago. No recent falls or trauma. Patient in NAD nontoxic. Patient not sure if she took too much coumadin for her A fib.   Will need to repeat INR check CBC to make sure not anemic due to blood loss. Use over coagulation protocol to determine appropriate treatment based on symptoms and INR level.      Shelda JakesScott W. Ilai Hiller, MD 10/14/13 [phone]
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
## 2013-10-14 NOTE — Progress Notes (Signed)
I agree with dietetic intern's documentation.  Marriana Hibberd Baron MS, RD, LDN [phone] Pager [phone] After Hours Pager  

## 2013-10-15 DIAGNOSIS — E876 Hypokalemia: Secondary | ICD-10-CM

## 2013-10-15 LAB — BASIC METABOLIC PANEL
BUN: 5 mg/dL — ABNORMAL LOW (ref 6–23)
CO2: 24 mEq/L (ref 19–32)
Calcium: 9.1 mg/dL (ref 8.4–10.5)
Chloride: 102 mEq/L (ref 96–112)
Creatinine, Ser: 0.53 mg/dL (ref 0.50–1.10)
GFR calc Af Amer: >90 mL/min (ref >90)
GFR calc non Af Amer: >90 mL/min (ref >90)
Glucose, Bld: 111 mg/dL — ABNORMAL HIGH (ref 70–99)
Potassium: 3 mEq/L — ABNORMAL LOW (ref 3.7–5.3)
Sodium: 140 mEq/L (ref 137–147)

## 2013-10-15 LAB — POTASSIUM: Potassium: 3.6 mEq/L — ABNORMAL LOW (ref 3.7–5.3)

## 2013-10-15 LAB — PROTIME-INR
INR: 1.23 (ref 0.00–1.49)
Prothrombin Time: 15.2 seconds (ref 11.6–15.2)

## 2013-10-15 LAB — MAGNESIUM: Magnesium: 2 mg/dL (ref 1.5–2.5)

## 2013-10-15 MED ORDER — POTASSIUM CHLORIDE 10 MEQ/100ML IV SOLN
10.0000 meq | INTRAVENOUS | Status: AC
  Administered 2013-10-15 (×2): 10 meq via INTRAVENOUS
  Filled 2013-10-15 (×2): qty 100

## 2013-10-15 MED ORDER — LEVOFLOXACIN 500 MG PO TABS: 500.0000 mg | ORAL_TABLET | Freq: Every day | ORAL | Status: DC

## 2013-10-15 MED ORDER — WARFARIN SODIUM 5 MG PO TABS
5.0000 mg | ORAL_TABLET | Freq: Once | ORAL | Status: DC
  Filled 2013-10-15: qty 1

## 2013-10-15 MED ORDER — POTASSIUM CHLORIDE CRYS ER 20 MEQ PO TBCR
40.0000 meq | EXTENDED_RELEASE_TABLET | Freq: Three times a day (TID) | ORAL | Status: DC
  Administered 2013-10-15 (×2): 40 meq via ORAL
  Filled 2013-10-15 (×3): qty 2

## 2013-10-15 MED ORDER — BOOST / RESOURCE BREEZE PO LIQD: 1.0000 | Freq: Two times a day (BID) | ORAL | Status: DC
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
## 2013-10-16 LAB — URINE CULTURE: Colony Count: >=100000

## 2013-10-26 ENCOUNTER — Encounter: Payer: Self-pay | Admitting: Cardiology

## 2013-11-03 ENCOUNTER — Other Ambulatory Visit: Payer: Self-pay | Admitting: Neurosurgery

## 2013-11-03 DIAGNOSIS — M542 Cervicalgia: Secondary | ICD-10-CM

## 2013-11-05 ENCOUNTER — Ambulatory Visit (INDEPENDENT_AMBULATORY_CARE_PROVIDER_SITE_OTHER): Payer: Commercial Managed Care - HMO | Admitting: Cardiology

## 2013-11-05 ENCOUNTER — Encounter: Payer: Self-pay | Admitting: Cardiology

## 2013-11-05 VITALS — BP 124/82 | HR 60 | Ht 67.0 in | Wt 211.0 lb

## 2013-11-05 DIAGNOSIS — Z79899 Other long term (current) drug therapy: Secondary | ICD-10-CM

## 2013-11-05 DIAGNOSIS — I4891 Unspecified atrial fibrillation: Secondary | ICD-10-CM

## 2013-11-05 DIAGNOSIS — S12200A Unspecified displaced fracture of third cervical vertebra, initial encounter for closed fracture: Secondary | ICD-10-CM
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
## 2013-11-05 NOTE — Patient Instructions (Signed)
Your physician has recommended you make the following change in your medication:  1. Stop Amiodarone  Your physician wants you to follow-up in: 6 months with Dr. Skains You will receive a reminder letter in the mail two months in advance. If you don't receive a letter, please call our office to schedule the follow-up appointment.   

## 2013-11-11 ENCOUNTER — Ambulatory Visit
Admission: RE | Admit: 2013-11-11 | Discharge: 2013-11-11 | Disposition: A | Discharge status: Home / Self Care | Payer: Medicare HMO | Source: Ambulatory Visit | Attending: Neurosurgery | Admitting: Neurosurgery

## 2013-11-11 ENCOUNTER — Other Ambulatory Visit: Payer: Self-pay | Admitting: Adult Health

## 2013-11-11 DIAGNOSIS — M542 Cervicalgia: Secondary | ICD-10-CM

## 2013-11-21 ENCOUNTER — Other Ambulatory Visit: Payer: Self-pay | Admitting: Adult Health

## 2014-01-22 ENCOUNTER — Other Ambulatory Visit: Payer: Self-pay | Admitting: Adult Health

## 2014-01-25 ENCOUNTER — Other Ambulatory Visit: Payer: Self-pay | Admitting: Adult Health

## 2014-05-06 ENCOUNTER — Telehealth: Payer: Self-pay | Admitting: Cardiology

## 2014-05-06 NOTE — Telephone Encounter (Signed)
Spoke to Dr. Zachery Dauer. We will contemplate changing over to novel agent. If she does not wish to do this, we will offer taking control over her Coumadin here. It has fluctuated quite a bit in Dr. Zachery Dauer is clinic.

## 2014-05-06 NOTE — Telephone Encounter (Signed)
°  Dr. Zachery Dauer would like to speak with Dr. Anne Fu. Please return call.

## 2014-05-16 ENCOUNTER — Ambulatory Visit: Payer: Commercial Managed Care - HMO | Admitting: Cardiology

## 2014-05-16 ENCOUNTER — Ambulatory Visit (INDEPENDENT_AMBULATORY_CARE_PROVIDER_SITE_OTHER): Payer: Commercial Managed Care - HMO | Admitting: Cardiology

## 2014-05-16 ENCOUNTER — Encounter: Payer: Self-pay | Admitting: Cardiology

## 2014-05-16 VITALS — BP 106/74 | HR 60 | Ht 68.0 in | Wt 202.0 lb

## 2014-05-16 DIAGNOSIS — I4819 Other persistent atrial fibrillation: Secondary | ICD-10-CM | POA: Insufficient documentation

## 2014-05-16 DIAGNOSIS — S12200S Unspecified displaced fracture of third cervical vertebra, sequela: Secondary | ICD-10-CM

## 2014-05-16 DIAGNOSIS — I480 Paroxysmal atrial fibrillation: Secondary | ICD-10-CM

## 2014-05-16 DIAGNOSIS — Z7901 Long term (current) use of anticoagulants: Secondary | ICD-10-CM | POA: Insufficient documentation
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
## 2014-05-16 NOTE — Patient Instructions (Signed)
The current medical regimen is effective;  continue present plan and medications.  You have been referred to our Coumadin Clinic for f/u of your INRs.  Follow up in 6 months with Dr. Anne FuSkains.  You will receive a letter in the mail 2 months before you are due.  Please call us when you receive this letter to schedule your follow up appointment.

## 2014-05-19 ENCOUNTER — Ambulatory Visit: Payer: Commercial Managed Care - HMO | Admitting: Cardiology

## 2014-05-31 ENCOUNTER — Ambulatory Visit (INDEPENDENT_AMBULATORY_CARE_PROVIDER_SITE_OTHER): Payer: Commercial Managed Care - HMO | Admitting: *Deleted

## 2014-05-31 DIAGNOSIS — I4819 Other persistent atrial fibrillation: Secondary | ICD-10-CM

## 2014-05-31 DIAGNOSIS — Z7901 Long term (current) use of anticoagulants: Secondary | ICD-10-CM

## 2014-05-31 DIAGNOSIS — I481 Persistent atrial fibrillation: Secondary | ICD-10-CM

## 2014-05-31 LAB — POCT INR: INR: 4.6

## 2014-05-31 NOTE — Patient Instructions (Signed)

## 2014-06-10 ENCOUNTER — Ambulatory Visit (INDEPENDENT_AMBULATORY_CARE_PROVIDER_SITE_OTHER): Payer: Commercial Managed Care - HMO | Admitting: *Deleted

## 2014-06-10 DIAGNOSIS — Z7901 Long term (current) use of anticoagulants: Secondary | ICD-10-CM

## 2014-06-10 DIAGNOSIS — I481 Persistent atrial fibrillation: Secondary | ICD-10-CM

## 2014-06-10 DIAGNOSIS — I4819 Other persistent atrial fibrillation: Secondary | ICD-10-CM

## 2014-06-10 LAB — POCT INR: INR: 1.9

## 2014-06-20 ENCOUNTER — Ambulatory Visit (INDEPENDENT_AMBULATORY_CARE_PROVIDER_SITE_OTHER): Payer: Commercial Managed Care - HMO

## 2014-06-20 DIAGNOSIS — I481 Persistent atrial fibrillation: Secondary | ICD-10-CM

## 2014-06-20 DIAGNOSIS — Z7901 Long term (current) use of anticoagulants: Secondary | ICD-10-CM

## 2014-06-20 DIAGNOSIS — I4819 Other persistent atrial fibrillation: Secondary | ICD-10-CM

## 2014-06-20 LAB — POCT INR: INR: 2.5

## 2014-07-04 ENCOUNTER — Ambulatory Visit: Payer: Commercial Managed Care - HMO | Admitting: Cardiology

## 2014-07-21 ENCOUNTER — Ambulatory Visit (INDEPENDENT_AMBULATORY_CARE_PROVIDER_SITE_OTHER): Payer: Commercial Managed Care - HMO

## 2014-07-21 DIAGNOSIS — I4819 Other persistent atrial fibrillation: Secondary | ICD-10-CM

## 2014-07-21 DIAGNOSIS — I481 Persistent atrial fibrillation: Secondary | ICD-10-CM

## 2014-07-21 DIAGNOSIS — Z7901 Long term (current) use of anticoagulants: Secondary | ICD-10-CM

## 2014-07-21 LAB — POCT INR: INR: 2.2

## 2014-08-18 ENCOUNTER — Ambulatory Visit (INDEPENDENT_AMBULATORY_CARE_PROVIDER_SITE_OTHER): Payer: PPO | Admitting: Pharmacist

## 2014-08-18 DIAGNOSIS — I481 Persistent atrial fibrillation: Secondary | ICD-10-CM

## 2014-08-18 DIAGNOSIS — Z7901 Long term (current) use of anticoagulants: Secondary | ICD-10-CM

## 2014-08-18 DIAGNOSIS — I4819 Other persistent atrial fibrillation: Secondary | ICD-10-CM

## 2014-08-18 LAB — POCT INR: INR: 3.1

## 2014-09-20 ENCOUNTER — Ambulatory Visit (INDEPENDENT_AMBULATORY_CARE_PROVIDER_SITE_OTHER): Payer: PPO | Admitting: *Deleted

## 2014-09-20 DIAGNOSIS — I481 Persistent atrial fibrillation: Secondary | ICD-10-CM

## 2014-09-20 DIAGNOSIS — Z7901 Long term (current) use of anticoagulants: Secondary | ICD-10-CM

## 2014-09-20 DIAGNOSIS — I4819 Other persistent atrial fibrillation: Secondary | ICD-10-CM

## 2014-09-20 LAB — POCT INR: INR: 2.8

## 2014-10-18 ENCOUNTER — Ambulatory Visit (INDEPENDENT_AMBULATORY_CARE_PROVIDER_SITE_OTHER): Payer: PPO | Admitting: *Deleted

## 2014-10-18 DIAGNOSIS — Z7901 Long term (current) use of anticoagulants: Secondary | ICD-10-CM

## 2014-10-18 DIAGNOSIS — I481 Persistent atrial fibrillation: Secondary | ICD-10-CM

## 2014-10-18 DIAGNOSIS — I4819 Other persistent atrial fibrillation: Secondary | ICD-10-CM

## 2014-10-18 LAB — POCT INR: INR: 3.3

## 2014-11-18 ENCOUNTER — Ambulatory Visit (INDEPENDENT_AMBULATORY_CARE_PROVIDER_SITE_OTHER): Payer: PPO | Admitting: *Deleted

## 2014-11-18 ENCOUNTER — Encounter: Payer: Self-pay | Admitting: Cardiology

## 2014-11-18 ENCOUNTER — Other Ambulatory Visit: Payer: Self-pay | Admitting: *Deleted

## 2014-11-18 ENCOUNTER — Ambulatory Visit (INDEPENDENT_AMBULATORY_CARE_PROVIDER_SITE_OTHER): Payer: PPO | Admitting: Cardiology

## 2014-11-18 VITALS — BP 108/62 | HR 63 | Ht 67.0 in | Wt 227.0 lb

## 2014-11-18 DIAGNOSIS — S12200S Unspecified displaced fracture of third cervical vertebra, sequela: Secondary | ICD-10-CM

## 2014-11-18 DIAGNOSIS — I480 Paroxysmal atrial fibrillation: Secondary | ICD-10-CM

## 2014-11-18 DIAGNOSIS — Z7901 Long term (current) use of anticoagulants: Secondary | ICD-10-CM | POA: Diagnosis not present

## 2014-11-18 DIAGNOSIS — I481 Persistent atrial fibrillation: Secondary | ICD-10-CM

## 2014-11-18 DIAGNOSIS — I4819 Other persistent atrial fibrillation: Secondary | ICD-10-CM

## 2014-11-18 LAB — POCT INR: INR: 3.1

## 2014-11-18 NOTE — Patient Instructions (Signed)
The current medical regimen is effective;  continue present plan and medications.  Follow up in 6 months with Dr. Skains.  You will receive a letter in the mail 2 months before you are due.  Please call us when you receive this letter to schedule your follow up appointment.  Thank you for choosing [facility]!!     
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
## 2014-11-21 IMAGING — CR DG CHEST 1V PORT
1 series · 1 of 1 positions shown · non-contrast
Comparison: 08/03/2013.

CLINICAL DATA: Atrial fibrillation. Postoperative Chest radiograph.
Intubation following wrist surgery.

EXAM:
PORTABLE CHEST - 1 VIEW

[AP]
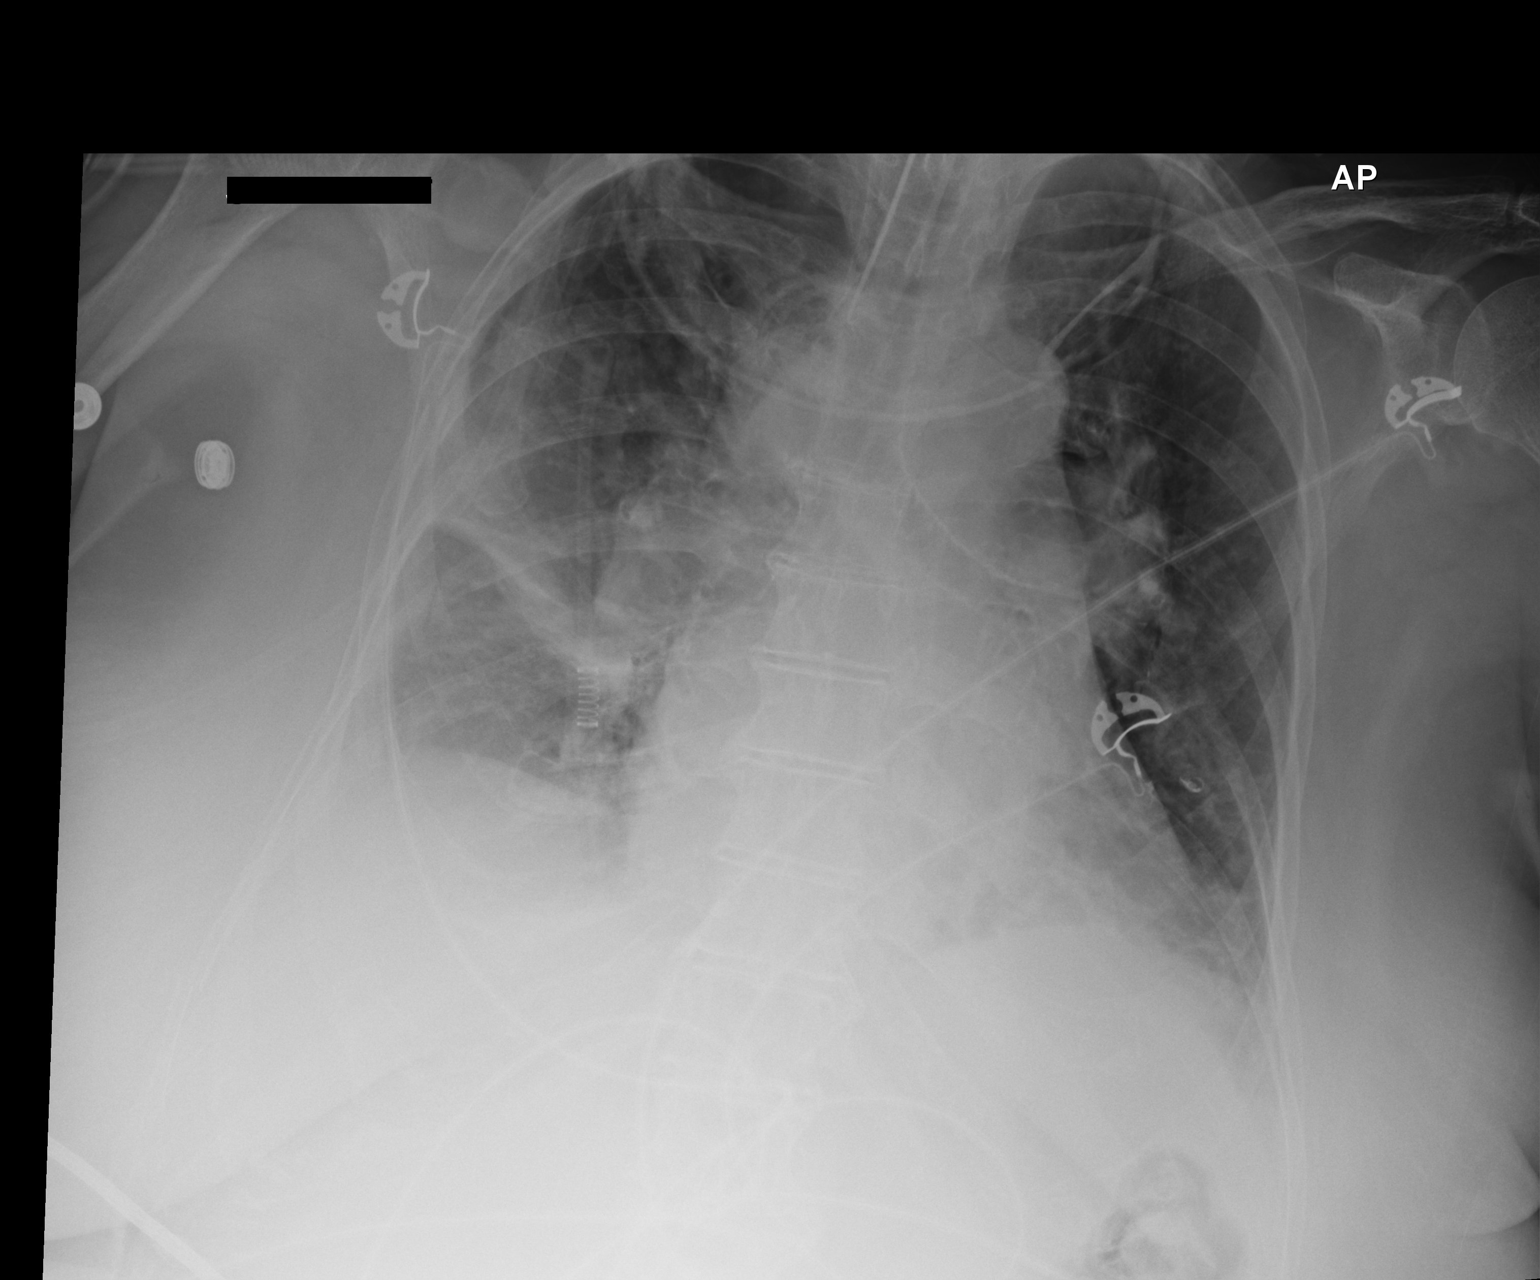

[1 of 1 positions shown; findings below may reference images not displayed]

FINDINGS: Cardiopericardial silhouette mildly enlarged. Endotracheal tube is
present with the tip 45 mm from the carina. Subsegmental atelectasis
in the right midlung appears similar to pre intubation radiograph.
Basilar atelectasis. Monitoring leads project over the chest. Small
right apical pneumothorax is unchanged compared to exam earlier
today. The lungs appear unchanged compared to prior. Right 4th rib
fracture again noted.
IMPRESSION: Endotracheal tube 45 mm from the carina. Unchanged small right
apical pneumothorax.
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
## 2014-12-09 ENCOUNTER — Ambulatory Visit (INDEPENDENT_AMBULATORY_CARE_PROVIDER_SITE_OTHER): Payer: PPO | Admitting: *Deleted

## 2014-12-09 DIAGNOSIS — I4819 Other persistent atrial fibrillation: Secondary | ICD-10-CM

## 2014-12-09 DIAGNOSIS — Z7901 Long term (current) use of anticoagulants: Secondary | ICD-10-CM | POA: Diagnosis not present

## 2014-12-09 DIAGNOSIS — I481 Persistent atrial fibrillation: Secondary | ICD-10-CM

## 2014-12-09 LAB — POCT INR: INR: 3.4

## 2014-12-23 ENCOUNTER — Other Ambulatory Visit: Payer: Self-pay

## 2014-12-23 ENCOUNTER — Ambulatory Visit (INDEPENDENT_AMBULATORY_CARE_PROVIDER_SITE_OTHER): Payer: PPO | Admitting: *Deleted

## 2014-12-23 DIAGNOSIS — I4819 Other persistent atrial fibrillation: Secondary | ICD-10-CM

## 2014-12-23 DIAGNOSIS — I481 Persistent atrial fibrillation: Secondary | ICD-10-CM

## 2014-12-23 DIAGNOSIS — Z7901 Long term (current) use of anticoagulants: Secondary | ICD-10-CM

## 2014-12-23 LAB — POCT INR: INR: 2

## 2014-12-23 MED ORDER — ATENOLOL 50 MG PO TABS: 50.0000 mg | ORAL_TABLET | Freq: Every day | ORAL | Status: DC

## 2014-12-23 MED ORDER — WARFARIN SODIUM 5 MG PO TABS: 5.0000 mg | ORAL_TABLET | ORAL | Status: DC

## 2014-12-23 MED ORDER — DILTIAZEM HCL ER BEADS 240 MG PO CP24: 240.0000 mg | ORAL_CAPSULE | Freq: Every day | ORAL | Status: DC

## 2015-01-18 ENCOUNTER — Ambulatory Visit (INDEPENDENT_AMBULATORY_CARE_PROVIDER_SITE_OTHER): Payer: PPO | Admitting: *Deleted

## 2015-01-18 DIAGNOSIS — Z7901 Long term (current) use of anticoagulants: Secondary | ICD-10-CM

## 2015-01-18 DIAGNOSIS — I481 Persistent atrial fibrillation: Secondary | ICD-10-CM

## 2015-01-18 DIAGNOSIS — I4819 Other persistent atrial fibrillation: Secondary | ICD-10-CM

## 2015-01-18 LAB — POCT INR: INR: 1.9

## 2015-02-01 ENCOUNTER — Ambulatory Visit (INDEPENDENT_AMBULATORY_CARE_PROVIDER_SITE_OTHER): Payer: PPO | Admitting: *Deleted

## 2015-02-01 DIAGNOSIS — I481 Persistent atrial fibrillation: Secondary | ICD-10-CM | POA: Diagnosis not present

## 2015-02-01 DIAGNOSIS — I4819 Other persistent atrial fibrillation: Secondary | ICD-10-CM

## 2015-02-01 DIAGNOSIS — Z7901 Long term (current) use of anticoagulants: Secondary | ICD-10-CM

## 2015-02-01 LAB — POCT INR: INR: 2.9

## 2015-02-14 NOTE — Addendum Note (Signed)
Addended by: Arcola JanskyOOK, BETHANY M on: 02/14/2015 09:41 AM   Modules accepted: Orders

## 2015-02-22 ENCOUNTER — Ambulatory Visit (INDEPENDENT_AMBULATORY_CARE_PROVIDER_SITE_OTHER): Payer: PPO | Admitting: Pharmacist

## 2015-02-22 DIAGNOSIS — I481 Persistent atrial fibrillation: Secondary | ICD-10-CM | POA: Diagnosis not present

## 2015-02-22 DIAGNOSIS — Z7901 Long term (current) use of anticoagulants: Secondary | ICD-10-CM

## 2015-02-22 DIAGNOSIS — I4819 Other persistent atrial fibrillation: Secondary | ICD-10-CM

## 2015-02-22 LAB — POCT INR: INR: 3.7

## 2015-03-23 ENCOUNTER — Encounter: Payer: Self-pay | Admitting: *Deleted

## 2015-03-27 ENCOUNTER — Ambulatory Visit (INDEPENDENT_AMBULATORY_CARE_PROVIDER_SITE_OTHER): Payer: PPO | Admitting: *Deleted

## 2015-03-27 DIAGNOSIS — Z7901 Long term (current) use of anticoagulants: Secondary | ICD-10-CM

## 2015-03-27 DIAGNOSIS — I4819 Other persistent atrial fibrillation: Secondary | ICD-10-CM

## 2015-03-27 DIAGNOSIS — I481 Persistent atrial fibrillation: Secondary | ICD-10-CM

## 2015-03-27 LAB — POCT INR: INR: 3.6

## 2015-04-11 ENCOUNTER — Ambulatory Visit (INDEPENDENT_AMBULATORY_CARE_PROVIDER_SITE_OTHER): Payer: PPO

## 2015-04-11 DIAGNOSIS — I4819 Other persistent atrial fibrillation: Secondary | ICD-10-CM

## 2015-04-11 DIAGNOSIS — Z7901 Long term (current) use of anticoagulants: Secondary | ICD-10-CM | POA: Diagnosis not present

## 2015-04-11 DIAGNOSIS — I481 Persistent atrial fibrillation: Secondary | ICD-10-CM

## 2015-04-11 LAB — POCT INR: INR: 1.7

## 2015-04-25 ENCOUNTER — Ambulatory Visit (INDEPENDENT_AMBULATORY_CARE_PROVIDER_SITE_OTHER): Payer: PPO

## 2015-04-25 DIAGNOSIS — I4819 Other persistent atrial fibrillation: Secondary | ICD-10-CM

## 2015-04-25 DIAGNOSIS — Z7901 Long term (current) use of anticoagulants: Secondary | ICD-10-CM | POA: Diagnosis not present

## 2015-04-25 DIAGNOSIS — I481 Persistent atrial fibrillation: Secondary | ICD-10-CM

## 2015-04-25 LAB — POCT INR: INR: 1.2

## 2015-05-02 ENCOUNTER — Ambulatory Visit (INDEPENDENT_AMBULATORY_CARE_PROVIDER_SITE_OTHER): Payer: PPO

## 2015-05-02 DIAGNOSIS — Z7901 Long term (current) use of anticoagulants: Secondary | ICD-10-CM | POA: Diagnosis not present

## 2015-05-02 DIAGNOSIS — I481 Persistent atrial fibrillation: Secondary | ICD-10-CM | POA: Diagnosis not present

## 2015-05-02 DIAGNOSIS — I4819 Other persistent atrial fibrillation: Secondary | ICD-10-CM

## 2015-05-02 LAB — POCT INR: INR: 1.5

## 2015-05-16 ENCOUNTER — Ambulatory Visit (INDEPENDENT_AMBULATORY_CARE_PROVIDER_SITE_OTHER): Payer: PPO | Admitting: Pharmacist

## 2015-05-16 DIAGNOSIS — Z7901 Long term (current) use of anticoagulants: Secondary | ICD-10-CM

## 2015-05-16 DIAGNOSIS — I481 Persistent atrial fibrillation: Secondary | ICD-10-CM

## 2015-05-16 DIAGNOSIS — I4819 Other persistent atrial fibrillation: Secondary | ICD-10-CM

## 2015-05-16 LAB — POCT INR: INR: 2.2

## 2015-05-30 ENCOUNTER — Ambulatory Visit (INDEPENDENT_AMBULATORY_CARE_PROVIDER_SITE_OTHER): Payer: PPO | Admitting: Cardiology

## 2015-05-30 ENCOUNTER — Encounter: Payer: Self-pay | Admitting: Cardiology

## 2015-05-30 ENCOUNTER — Ambulatory Visit (INDEPENDENT_AMBULATORY_CARE_PROVIDER_SITE_OTHER): Payer: PPO

## 2015-05-30 VITALS — BP 126/70 | HR 63 | Ht 68.0 in | Wt 234.0 lb

## 2015-05-30 DIAGNOSIS — Z7901 Long term (current) use of anticoagulants: Secondary | ICD-10-CM

## 2015-05-30 DIAGNOSIS — R55 Syncope and collapse: Secondary | ICD-10-CM | POA: Diagnosis not present

## 2015-05-30 DIAGNOSIS — I480 Paroxysmal atrial fibrillation: Secondary | ICD-10-CM | POA: Diagnosis not present

## 2015-05-30 DIAGNOSIS — I481 Persistent atrial fibrillation: Secondary | ICD-10-CM

## 2015-05-30 DIAGNOSIS — S12200A Unspecified displaced fracture of third cervical vertebra, initial encounter for closed fracture: Secondary | ICD-10-CM

## 2015-05-30 DIAGNOSIS — I4819 Other persistent atrial fibrillation: Secondary | ICD-10-CM

## 2015-05-30 LAB — POCT INR: INR: 3.5

## 2015-05-30 MED ORDER — ATENOLOL 25 MG PO TABS: 25.0000 mg | ORAL_TABLET | Freq: Every day | ORAL | Status: DC
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
## 2015-05-30 NOTE — Patient Instructions (Signed)
Medication Instructions:  Please decrease your Atenolol to 25 mg a day. Continue all other medications as listed.  Follow-Up: Follow up in 3 months with Dr Anne FuSkains.  Thank you for choosing [facility]!!

## 2015-06-13 ENCOUNTER — Ambulatory Visit (INDEPENDENT_AMBULATORY_CARE_PROVIDER_SITE_OTHER): Payer: PPO | Admitting: *Deleted

## 2015-06-13 DIAGNOSIS — Z7901 Long term (current) use of anticoagulants: Secondary | ICD-10-CM

## 2015-06-13 DIAGNOSIS — I481 Persistent atrial fibrillation: Secondary | ICD-10-CM

## 2015-06-13 DIAGNOSIS — I4819 Other persistent atrial fibrillation: Secondary | ICD-10-CM

## 2015-06-13 LAB — POCT INR: INR: 2.3

## 2015-06-14 ENCOUNTER — Other Ambulatory Visit: Payer: Self-pay | Admitting: Cardiology

## 2015-07-04 ENCOUNTER — Ambulatory Visit (INDEPENDENT_AMBULATORY_CARE_PROVIDER_SITE_OTHER): Payer: PPO | Admitting: *Deleted

## 2015-07-04 DIAGNOSIS — I4819 Other persistent atrial fibrillation: Secondary | ICD-10-CM

## 2015-07-04 DIAGNOSIS — I481 Persistent atrial fibrillation: Secondary | ICD-10-CM

## 2015-07-04 DIAGNOSIS — Z7901 Long term (current) use of anticoagulants: Secondary | ICD-10-CM | POA: Diagnosis not present

## 2015-07-04 LAB — POCT INR: INR: 2.7

## 2015-07-12 ENCOUNTER — Other Ambulatory Visit: Payer: Self-pay

## 2015-07-12 MED ORDER — ATENOLOL 25 MG PO TABS: 25.0000 mg | ORAL_TABLET | Freq: Every day | ORAL | Status: DC

## 2015-07-16 ENCOUNTER — Other Ambulatory Visit: Payer: Self-pay | Admitting: Cardiology

## 2015-08-01 ENCOUNTER — Ambulatory Visit (INDEPENDENT_AMBULATORY_CARE_PROVIDER_SITE_OTHER): Payer: PPO | Admitting: *Deleted

## 2015-08-01 DIAGNOSIS — I4819 Other persistent atrial fibrillation: Secondary | ICD-10-CM

## 2015-08-01 DIAGNOSIS — I481 Persistent atrial fibrillation: Secondary | ICD-10-CM | POA: Diagnosis not present

## 2015-08-01 DIAGNOSIS — Z7901 Long term (current) use of anticoagulants: Secondary | ICD-10-CM

## 2015-08-01 LAB — POCT INR: INR: 2.8

## 2015-08-06 ENCOUNTER — Other Ambulatory Visit: Payer: Self-pay | Admitting: Cardiology

## 2015-08-10 ENCOUNTER — Other Ambulatory Visit: Payer: Self-pay | Admitting: Cardiology

## 2015-08-13 ENCOUNTER — Telehealth: Payer: Self-pay | Admitting: Physician Assistant

## 2015-08-13 ENCOUNTER — Other Ambulatory Visit: Payer: Self-pay | Admitting: Physician Assistant

## 2015-08-13 ENCOUNTER — Other Ambulatory Visit: Payer: Self-pay | Admitting: Cardiology

## 2015-08-13 MED ORDER — DILTIAZEM HCL ER COATED BEADS 240 MG PO CP24: 240.0000 mg | ORAL_CAPSULE | Freq: Every day | ORAL | Status: DC

## 2015-08-13 NOTE — Telephone Encounter (Signed)
    She ran out of her Cardizem 240mg  and needed refills. She has been taking  This is not on her medication list. I have refilled this for her and advised her to make sure they update her med list next time she is seen.    Cline CrockKathryn Irva Loser PA-C  [facility]

## 2015-08-16 DIAGNOSIS — E782 Mixed hyperlipidemia: Secondary | ICD-10-CM | POA: Diagnosis not present

## 2015-08-16 DIAGNOSIS — Z79899 Other long term (current) drug therapy: Secondary | ICD-10-CM | POA: Diagnosis not present

## 2015-08-16 DIAGNOSIS — I4891 Unspecified atrial fibrillation: Secondary | ICD-10-CM | POA: Diagnosis not present

## 2015-08-16 DIAGNOSIS — R17 Unspecified jaundice: Secondary | ICD-10-CM | POA: Diagnosis not present

## 2015-08-16 DIAGNOSIS — R7301 Impaired fasting glucose: Secondary | ICD-10-CM | POA: Diagnosis not present

## 2015-08-16 DIAGNOSIS — Z87828 Personal history of other (healed) physical injury and trauma: Secondary | ICD-10-CM | POA: Diagnosis not present

## 2015-08-16 DIAGNOSIS — R319 Hematuria, unspecified: Secondary | ICD-10-CM | POA: Diagnosis not present

## 2015-08-16 DIAGNOSIS — E559 Vitamin D deficiency, unspecified: Secondary | ICD-10-CM | POA: Diagnosis not present

## 2015-08-16 DIAGNOSIS — Z8781 Personal history of (healed) traumatic fracture: Secondary | ICD-10-CM | POA: Diagnosis not present

## 2015-08-16 DIAGNOSIS — Z87442 Personal history of urinary calculi: Secondary | ICD-10-CM | POA: Diagnosis not present

## 2015-08-16 DIAGNOSIS — R791 Abnormal coagulation profile: Secondary | ICD-10-CM | POA: Diagnosis not present

## 2015-08-17 ENCOUNTER — Ambulatory Visit (INDEPENDENT_AMBULATORY_CARE_PROVIDER_SITE_OTHER): Payer: PPO | Admitting: *Deleted

## 2015-08-17 DIAGNOSIS — Z7901 Long term (current) use of anticoagulants: Secondary | ICD-10-CM | POA: Diagnosis not present

## 2015-08-17 DIAGNOSIS — I4819 Other persistent atrial fibrillation: Secondary | ICD-10-CM

## 2015-08-17 DIAGNOSIS — I481 Persistent atrial fibrillation: Secondary | ICD-10-CM

## 2015-08-17 LAB — POCT INR: INR: 4.9

## 2015-08-18 ENCOUNTER — Telehealth: Payer: Self-pay | Admitting: Cardiology

## 2015-08-18 NOTE — Telephone Encounter (Signed)
Reviewed information with Dr Anne FuSkains who gave orders for pt to hold warfarin for 2 days then restart as instructed by the CC.  NA at # pt left.  Lm for her of instruction and requested she call the after hours number with questions or further concerns.  If blood in urine continues she will need to see her PCP to make sure she doesn't have a UTI, etc.

## 2015-08-18 NOTE — Telephone Encounter (Signed)
New Message  Pt called states that she has blood in her urine again. Request a call back to discuss how to take her medications.

## 2015-08-24 ENCOUNTER — Ambulatory Visit (INDEPENDENT_AMBULATORY_CARE_PROVIDER_SITE_OTHER): Payer: PPO | Admitting: Pharmacist

## 2015-08-24 DIAGNOSIS — Z7901 Long term (current) use of anticoagulants: Secondary | ICD-10-CM | POA: Diagnosis not present

## 2015-08-24 DIAGNOSIS — I481 Persistent atrial fibrillation: Secondary | ICD-10-CM | POA: Diagnosis not present

## 2015-08-24 DIAGNOSIS — I4819 Other persistent atrial fibrillation: Secondary | ICD-10-CM

## 2015-08-24 LAB — POCT INR: INR: 1.8

## 2015-08-30 ENCOUNTER — Ambulatory Visit (INDEPENDENT_AMBULATORY_CARE_PROVIDER_SITE_OTHER): Payer: PPO | Admitting: Cardiology

## 2015-08-30 ENCOUNTER — Ambulatory Visit (INDEPENDENT_AMBULATORY_CARE_PROVIDER_SITE_OTHER): Payer: PPO

## 2015-08-30 ENCOUNTER — Ambulatory Visit (INDEPENDENT_AMBULATORY_CARE_PROVIDER_SITE_OTHER): Payer: PPO | Admitting: Pharmacist

## 2015-08-30 ENCOUNTER — Encounter: Payer: Self-pay | Admitting: Cardiology

## 2015-08-30 VITALS — BP 132/70 | HR 53 | Ht 67.0 in | Wt 229.4 lb

## 2015-08-30 DIAGNOSIS — I481 Persistent atrial fibrillation: Secondary | ICD-10-CM | POA: Diagnosis not present

## 2015-08-30 DIAGNOSIS — Z79899 Other long term (current) drug therapy: Secondary | ICD-10-CM | POA: Diagnosis not present

## 2015-08-30 DIAGNOSIS — I480 Paroxysmal atrial fibrillation: Secondary | ICD-10-CM

## 2015-08-30 DIAGNOSIS — Z7901 Long term (current) use of anticoagulants: Secondary | ICD-10-CM

## 2015-08-30 DIAGNOSIS — I4819 Other persistent atrial fibrillation: Secondary | ICD-10-CM

## 2015-08-30 DIAGNOSIS — R55 Syncope and collapse: Secondary | ICD-10-CM

## 2015-08-30 LAB — POCT INR: INR: 3
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
## 2015-08-30 NOTE — Patient Instructions (Signed)
Medication Instructions:  Please stop Atenolol. Continue all other medications as listed.  Testing/Procedures: Your physician has recommended that you wear an event monitor. Event monitors are medical devices that record the heart's electrical activity. Doctors most often [place] these monitors to diagnose arrhythmias. Arrhythmias are problems with the speed or rhythm of the heartbeat. The monitor is a small, portable device. You can wear one while you do your normal daily activities. This is usually used to diagnose what is causing palpitations/syncope (passing out).  Follow-Up: Follow up in 6 months with Dr. Anne Fu.  You will receive a letter in the mail 2 months before you are due.  Please call us when you receive this letter to schedule your follow up appointment.  If you need a refill on your cardiac medications before your next appointment, please call your pharmacy.

## 2015-09-06 ENCOUNTER — Telehealth: Payer: Self-pay | Admitting: Cardiology

## 2015-09-06 NOTE — Telephone Encounter (Signed)
Spoke with pt who states she was told by her NP that since she stopped the Atenolol and has not had any other s/s it is OK for her not to  Wear the monitor.  Advised per Dr Anne Fu' order she is to control to wear the monitor for the 30 day period.  She states understanding.

## 2015-09-06 NOTE — Telephone Encounter (Signed)
Follow up// New Message  Pt called states that she is currently wearing a monitor. Requests a call back to discuss if she should continue to wear it being that she feels as if it is no longer needed. Please call

## 2015-09-19 ENCOUNTER — Telehealth: Payer: Self-pay | Admitting: Cardiology

## 2015-09-19 NOTE — Telephone Encounter (Signed)
New message      Pt is on coumadin.  What can she take for a cold?

## 2015-09-19 NOTE — Telephone Encounter (Signed)
Returned call to pt, advised ok to take Mucinex prn for chest congestion, Tylenol based cold/flu medications, Delsym cough syrup, or Coricidin HBP prn for cold/flu symptom relief.  Call primary MD if symptoms persist or worsen.  Call us if any new rx medications rx.  Pt verbalized understanding.

## 2015-09-29 ENCOUNTER — Ambulatory Visit (INDEPENDENT_AMBULATORY_CARE_PROVIDER_SITE_OTHER): Payer: PPO | Admitting: *Deleted

## 2015-09-29 ENCOUNTER — Encounter: Payer: Self-pay | Admitting: *Deleted

## 2015-09-29 DIAGNOSIS — Z7901 Long term (current) use of anticoagulants: Secondary | ICD-10-CM | POA: Diagnosis not present

## 2015-09-29 DIAGNOSIS — I4819 Other persistent atrial fibrillation: Secondary | ICD-10-CM

## 2015-09-29 DIAGNOSIS — I481 Persistent atrial fibrillation: Secondary | ICD-10-CM | POA: Diagnosis not present

## 2015-09-29 LAB — POCT INR: INR: 2.7

## 2015-09-29 NOTE — Progress Notes (Signed)
Patient ID: [name], female   DOB: January 18, 1946, 70 y.o.   MRN: [id number] [employer] 30 day cardiac event monitor returned to [facility] office by patient.  Monitor will be sent out by [employer] to [employer] from our office on 10/02/2015.

## 2015-10-04 DIAGNOSIS — H43812 Vitreous degeneration, left eye: Secondary | ICD-10-CM | POA: Diagnosis not present

## 2015-10-04 DIAGNOSIS — H5213 Myopia, bilateral: Secondary | ICD-10-CM | POA: Diagnosis not present

## 2015-10-04 DIAGNOSIS — H2513 Age-related nuclear cataract, bilateral: Secondary | ICD-10-CM | POA: Diagnosis not present

## 2015-10-06 DIAGNOSIS — R413 Other amnesia: Secondary | ICD-10-CM | POA: Diagnosis not present

## 2015-10-06 DIAGNOSIS — F419 Anxiety disorder, unspecified: Secondary | ICD-10-CM | POA: Diagnosis not present

## 2015-10-11 DIAGNOSIS — R413 Other amnesia: Secondary | ICD-10-CM | POA: Diagnosis not present

## 2015-10-27 ENCOUNTER — Ambulatory Visit (INDEPENDENT_AMBULATORY_CARE_PROVIDER_SITE_OTHER): Payer: PPO | Admitting: *Deleted

## 2015-10-27 DIAGNOSIS — I4819 Other persistent atrial fibrillation: Secondary | ICD-10-CM

## 2015-10-27 DIAGNOSIS — I481 Persistent atrial fibrillation: Secondary | ICD-10-CM | POA: Diagnosis not present

## 2015-10-27 DIAGNOSIS — Z7901 Long term (current) use of anticoagulants: Secondary | ICD-10-CM

## 2015-10-27 LAB — POCT INR: INR: 3.5

## 2015-11-24 ENCOUNTER — Ambulatory Visit (INDEPENDENT_AMBULATORY_CARE_PROVIDER_SITE_OTHER): Payer: PPO

## 2015-11-24 DIAGNOSIS — I481 Persistent atrial fibrillation: Secondary | ICD-10-CM

## 2015-11-24 DIAGNOSIS — I4819 Other persistent atrial fibrillation: Secondary | ICD-10-CM

## 2015-11-24 DIAGNOSIS — Z7901 Long term (current) use of anticoagulants: Secondary | ICD-10-CM

## 2015-11-24 LAB — POCT INR: INR: 2.7

## 2015-12-22 ENCOUNTER — Ambulatory Visit (INDEPENDENT_AMBULATORY_CARE_PROVIDER_SITE_OTHER): Payer: PPO

## 2015-12-22 DIAGNOSIS — I481 Persistent atrial fibrillation: Secondary | ICD-10-CM

## 2015-12-22 DIAGNOSIS — I4819 Other persistent atrial fibrillation: Secondary | ICD-10-CM

## 2015-12-22 DIAGNOSIS — Z7901 Long term (current) use of anticoagulants: Secondary | ICD-10-CM | POA: Diagnosis not present

## 2015-12-22 LAB — POCT INR: INR: 2.4

## 2016-01-19 ENCOUNTER — Ambulatory Visit (INDEPENDENT_AMBULATORY_CARE_PROVIDER_SITE_OTHER): Payer: PPO

## 2016-01-19 DIAGNOSIS — Z7901 Long term (current) use of anticoagulants: Secondary | ICD-10-CM

## 2016-01-19 DIAGNOSIS — I481 Persistent atrial fibrillation: Secondary | ICD-10-CM | POA: Diagnosis not present

## 2016-01-19 DIAGNOSIS — I4819 Other persistent atrial fibrillation: Secondary | ICD-10-CM

## 2016-01-19 LAB — POCT INR: INR: 2.1

## 2016-01-23 ENCOUNTER — Telehealth: Payer: Self-pay | Admitting: Cardiology

## 2016-01-23 NOTE — Telephone Encounter (Signed)
Disability Parking Pla-Card form signed and Completed Left patient VM ready for pick up.

## 2016-01-29 ENCOUNTER — Telehealth: Payer: Self-pay | Admitting: Cardiology

## 2016-01-29 NOTE — Telephone Encounter (Signed)
Handicapped Pla-Card placed in mail to patients home per her request.

## 2016-01-29 NOTE — Telephone Encounter (Signed)
Pt is stating she would like the handicap form mailed to her at her home address. She verified her mailing address for me.  Advised I will retreive it from the front desk where it has been for her to pick up and mail it to her.  She was made aware it will take about a week to receive it in the mail.  She states this is OK since she doesn't need it until September.

## 2016-01-29 NOTE — Telephone Encounter (Signed)
Follow-up     The pt would like the form mailed to her if not she can come by and get it, it for the handicap form.

## 2016-03-05 ENCOUNTER — Ambulatory Visit (INDEPENDENT_AMBULATORY_CARE_PROVIDER_SITE_OTHER): Payer: PPO | Admitting: *Deleted

## 2016-03-05 DIAGNOSIS — Z7901 Long term (current) use of anticoagulants: Secondary | ICD-10-CM

## 2016-03-05 DIAGNOSIS — I481 Persistent atrial fibrillation: Secondary | ICD-10-CM

## 2016-03-05 DIAGNOSIS — I4819 Other persistent atrial fibrillation: Secondary | ICD-10-CM

## 2016-03-05 LAB — POCT INR: INR: 2.6

## 2016-03-05 MED ORDER — WARFARIN SODIUM 5 MG PO TABS: ORAL_TABLET | ORAL | 1 refills | Status: DC

## 2016-03-14 DIAGNOSIS — H25013 Cortical age-related cataract, bilateral: Secondary | ICD-10-CM | POA: Diagnosis not present

## 2016-03-14 DIAGNOSIS — H35361 Drusen (degenerative) of macula, right eye: Secondary | ICD-10-CM | POA: Diagnosis not present

## 2016-03-14 DIAGNOSIS — H2513 Age-related nuclear cataract, bilateral: Secondary | ICD-10-CM | POA: Diagnosis not present

## 2016-03-14 DIAGNOSIS — H43813 Vitreous degeneration, bilateral: Secondary | ICD-10-CM | POA: Diagnosis not present

## 2016-04-22 ENCOUNTER — Ambulatory Visit (INDEPENDENT_AMBULATORY_CARE_PROVIDER_SITE_OTHER): Payer: PPO | Admitting: *Deleted

## 2016-04-22 DIAGNOSIS — Z7901 Long term (current) use of anticoagulants: Secondary | ICD-10-CM

## 2016-04-22 DIAGNOSIS — I481 Persistent atrial fibrillation: Secondary | ICD-10-CM

## 2016-04-22 DIAGNOSIS — I4819 Other persistent atrial fibrillation: Secondary | ICD-10-CM

## 2016-04-22 LAB — POCT INR: INR: 3.4

## 2016-05-13 ENCOUNTER — Ambulatory Visit (INDEPENDENT_AMBULATORY_CARE_PROVIDER_SITE_OTHER): Payer: PPO | Admitting: *Deleted

## 2016-05-13 DIAGNOSIS — Z7901 Long term (current) use of anticoagulants: Secondary | ICD-10-CM | POA: Diagnosis not present

## 2016-05-13 DIAGNOSIS — I481 Persistent atrial fibrillation: Secondary | ICD-10-CM

## 2016-05-13 DIAGNOSIS — I4819 Other persistent atrial fibrillation: Secondary | ICD-10-CM

## 2016-05-13 LAB — POCT INR: INR: 2.8

## 2016-06-07 DIAGNOSIS — E119 Type 2 diabetes mellitus without complications: Secondary | ICD-10-CM | POA: Diagnosis not present

## 2016-06-10 ENCOUNTER — Ambulatory Visit (INDEPENDENT_AMBULATORY_CARE_PROVIDER_SITE_OTHER): Payer: PPO | Admitting: Pharmacist

## 2016-06-10 DIAGNOSIS — Z7901 Long term (current) use of anticoagulants: Secondary | ICD-10-CM

## 2016-06-10 DIAGNOSIS — I4819 Other persistent atrial fibrillation: Secondary | ICD-10-CM

## 2016-06-10 DIAGNOSIS — I481 Persistent atrial fibrillation: Secondary | ICD-10-CM | POA: Diagnosis not present

## 2016-06-10 LAB — POCT INR: INR: 2.7

## 2016-07-08 ENCOUNTER — Other Ambulatory Visit: Payer: Self-pay | Admitting: Physician Assistant

## 2016-07-15 ENCOUNTER — Ambulatory Visit (INDEPENDENT_AMBULATORY_CARE_PROVIDER_SITE_OTHER): Payer: PPO | Admitting: *Deleted

## 2016-07-15 DIAGNOSIS — I481 Persistent atrial fibrillation: Secondary | ICD-10-CM

## 2016-07-15 DIAGNOSIS — Z7901 Long term (current) use of anticoagulants: Secondary | ICD-10-CM

## 2016-07-15 DIAGNOSIS — I4819 Other persistent atrial fibrillation: Secondary | ICD-10-CM

## 2016-07-15 LAB — POCT INR: INR: 1.7

## 2016-07-31 DIAGNOSIS — Z5181 Encounter for therapeutic drug level monitoring: Secondary | ICD-10-CM | POA: Diagnosis not present

## 2016-07-31 DIAGNOSIS — Z6837 Body mass index (BMI) 37.0-37.9, adult: Secondary | ICD-10-CM | POA: Diagnosis not present

## 2016-07-31 DIAGNOSIS — Z1211 Encounter for screening for malignant neoplasm of colon: Secondary | ICD-10-CM | POA: Diagnosis not present

## 2016-07-31 DIAGNOSIS — E119 Type 2 diabetes mellitus without complications: Secondary | ICD-10-CM | POA: Diagnosis not present

## 2016-07-31 DIAGNOSIS — Z0000 Encounter for general adult medical examination without abnormal findings: Secondary | ICD-10-CM | POA: Diagnosis not present

## 2016-07-31 DIAGNOSIS — E559 Vitamin D deficiency, unspecified: Secondary | ICD-10-CM | POA: Diagnosis not present

## 2016-07-31 DIAGNOSIS — E782 Mixed hyperlipidemia: Secondary | ICD-10-CM | POA: Diagnosis not present

## 2016-07-31 DIAGNOSIS — Z23 Encounter for immunization: Secondary | ICD-10-CM | POA: Diagnosis not present

## 2016-08-15 ENCOUNTER — Ambulatory Visit (INDEPENDENT_AMBULATORY_CARE_PROVIDER_SITE_OTHER): Payer: PPO | Admitting: *Deleted

## 2016-08-15 DIAGNOSIS — Z7901 Long term (current) use of anticoagulants: Secondary | ICD-10-CM

## 2016-08-15 LAB — POCT INR: INR: 2.2

## 2016-09-01 ENCOUNTER — Other Ambulatory Visit: Payer: Self-pay | Admitting: Cardiology

## 2016-09-03 ENCOUNTER — Telehealth: Payer: Self-pay | Admitting: *Deleted
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
## 2016-09-16 ENCOUNTER — Ambulatory Visit: Payer: PPO | Admitting: Podiatry

## 2016-09-18 ENCOUNTER — Ambulatory Visit (INDEPENDENT_AMBULATORY_CARE_PROVIDER_SITE_OTHER): Payer: PPO | Admitting: Podiatry

## 2016-09-18 ENCOUNTER — Encounter: Payer: Self-pay | Admitting: Podiatry

## 2016-09-18 VITALS — BP 132/87 | HR 78 | Resp 16 | Ht 67.0 in | Wt 229.0 lb

## 2016-09-18 DIAGNOSIS — Q828 Other specified congenital malformations of skin: Secondary | ICD-10-CM | POA: Diagnosis not present

## 2016-09-18 DIAGNOSIS — M779 Enthesopathy, unspecified: Secondary | ICD-10-CM | POA: Diagnosis not present

## 2016-09-18 NOTE — Progress Notes (Signed)
   Subjective:    Patient ID: [name], female    DOB: 02/06/1946, 71 y.o.   MRN: [id number]  HPI this patient presents the office with chief complaint of a painful skin lesion on the bottom of her right forefoot. She says that she has worked on her callus and worn pads, but the problem persists resents the office today for continued evaluation and treatment of this painful callus right forefoot. She presents for evaluation and treatment    Review of Systems  All other systems reviewed and are negative.      Objective:   Physical Exam GENERAL APPEARANCE: Alert, conversant. Appropriately groomed. No acute distress.  VASCULAR: Pedal pulses are  palpable at  DP bilateral. PT pulses are non palpable due to swelling both ankles. Capillary refill time is immediate to all digits,  Normal temperature gradient.   NEUROLOGIC: sensation is normal to 5.07 monofilament at 5/5 sites bilateral.  Light touch is intact bilateral, Muscle strength normal.  MUSCULOSKELETAL: acceptable muscle strength, tone and stability bilateral.  Intrinsic muscluature intact bilateral.  Rectus appearance of foot and digits noted bilateral. Palpable pain sub 3 right foot.   DERMATOLOGIC: skin color, texture, and turgor are within normal limits.  No preulcerative lesions or ulcers  are seen, no interdigital maceration noted.  No open lesions present.  Digital nails are asymptomatic. No drainage noted. Porokeratosis right forefoot.         Assessment & Plan:  Porokeratosis right foot  Capsulitis sub 3 right foot.  IE  Debride porokeratosis  Right foot. RTC prn  Helane GuntherGregory Eletha Culbertson DPM

## 2016-09-25 ENCOUNTER — Ambulatory Visit (INDEPENDENT_AMBULATORY_CARE_PROVIDER_SITE_OTHER): Payer: PPO | Admitting: *Deleted

## 2016-09-25 DIAGNOSIS — Z7901 Long term (current) use of anticoagulants: Secondary | ICD-10-CM

## 2016-09-25 DIAGNOSIS — I4819 Other persistent atrial fibrillation: Secondary | ICD-10-CM

## 2016-09-25 DIAGNOSIS — I481 Persistent atrial fibrillation: Secondary | ICD-10-CM

## 2016-09-25 LAB — POCT INR: INR: 3

## 2016-09-27 DIAGNOSIS — N950 Postmenopausal bleeding: Secondary | ICD-10-CM | POA: Diagnosis not present

## 2016-10-01 DIAGNOSIS — N950 Postmenopausal bleeding: Secondary | ICD-10-CM | POA: Diagnosis not present

## 2016-10-02 ENCOUNTER — Other Ambulatory Visit: Payer: Self-pay | Admitting: Family Medicine

## 2016-10-02 DIAGNOSIS — N950 Postmenopausal bleeding: Secondary | ICD-10-CM

## 2016-10-03 ENCOUNTER — Ambulatory Visit
Admission: RE | Admit: 2016-10-03 | Discharge: 2016-10-03 | Disposition: A | Discharge status: Home / Self Care | Payer: PPO | Source: Ambulatory Visit | Attending: Family Medicine | Admitting: Family Medicine

## 2016-10-03 DIAGNOSIS — N950 Postmenopausal bleeding: Secondary | ICD-10-CM

## 2016-10-25 ENCOUNTER — Ambulatory Visit (INDEPENDENT_AMBULATORY_CARE_PROVIDER_SITE_OTHER): Payer: PPO | Admitting: *Deleted

## 2016-10-25 DIAGNOSIS — I481 Persistent atrial fibrillation: Secondary | ICD-10-CM | POA: Diagnosis not present

## 2016-10-25 DIAGNOSIS — Z7901 Long term (current) use of anticoagulants: Secondary | ICD-10-CM

## 2016-10-25 DIAGNOSIS — I4819 Other persistent atrial fibrillation: Secondary | ICD-10-CM

## 2016-10-25 LAB — POCT INR: INR: 2.7

## 2016-11-07 DIAGNOSIS — N950 Postmenopausal bleeding: Secondary | ICD-10-CM | POA: Diagnosis not present

## 2016-12-03 DIAGNOSIS — I890 Lymphedema, not elsewhere classified: Secondary | ICD-10-CM | POA: Diagnosis not present

## 2016-12-03 DIAGNOSIS — L03115 Cellulitis of right lower limb: Secondary | ICD-10-CM | POA: Diagnosis not present

## 2016-12-10 ENCOUNTER — Ambulatory Visit (INDEPENDENT_AMBULATORY_CARE_PROVIDER_SITE_OTHER): Payer: PPO

## 2016-12-10 DIAGNOSIS — Z7901 Long term (current) use of anticoagulants: Secondary | ICD-10-CM

## 2016-12-10 DIAGNOSIS — I481 Persistent atrial fibrillation: Secondary | ICD-10-CM

## 2016-12-10 DIAGNOSIS — I4819 Other persistent atrial fibrillation: Secondary | ICD-10-CM

## 2016-12-10 LAB — POCT INR: INR: 4.3

## 2016-12-12 ENCOUNTER — Telehealth: Payer: Self-pay | Admitting: Cardiology

## 2016-12-12 DIAGNOSIS — R000 Tachycardia, unspecified: Secondary | ICD-10-CM

## 2016-12-12 NOTE — Telephone Encounter (Signed)
Lm to cb.

## 2016-12-12 NOTE — Telephone Encounter (Signed)
New message      Pt saw her PCP yesterday.  She told them about an episode of her heart beating really fast where she had to pull over. They wanted to send her to the ER but pt refused.  Patient is calling to talk to the nurse

## 2016-12-12 NOTE — Telephone Encounter (Signed)
Let's set her up for a 30 day event monitor. I'm wondering if she is having atrial fibrillation episodes again. Remember she is off of the amiodarone. Donato SchultzMark Skains, MD
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
## 2016-12-12 NOTE — Telephone Encounter (Signed)
Left message for pt to expect a call for a cardiac event monitor to be placed per Dr Anne FuSkains for rapid HR possible At Fib.  Requested she call back if questions.

## 2016-12-14 DIAGNOSIS — R600 Localized edema: Secondary | ICD-10-CM | POA: Diagnosis not present

## 2016-12-20 DIAGNOSIS — R600 Localized edema: Secondary | ICD-10-CM | POA: Diagnosis not present

## 2016-12-24 ENCOUNTER — Encounter: Payer: Self-pay | Admitting: *Deleted

## 2016-12-24 ENCOUNTER — Ambulatory Visit (INDEPENDENT_AMBULATORY_CARE_PROVIDER_SITE_OTHER): Payer: PPO | Admitting: *Deleted

## 2016-12-24 ENCOUNTER — Ambulatory Visit: Payer: PPO

## 2016-12-24 ENCOUNTER — Encounter (INDEPENDENT_AMBULATORY_CARE_PROVIDER_SITE_OTHER): Payer: Self-pay

## 2016-12-24 DIAGNOSIS — I4819 Other persistent atrial fibrillation: Secondary | ICD-10-CM

## 2016-12-24 DIAGNOSIS — I481 Persistent atrial fibrillation: Secondary | ICD-10-CM

## 2016-12-24 DIAGNOSIS — Z7901 Long term (current) use of anticoagulants: Secondary | ICD-10-CM

## 2016-12-24 LAB — POCT INR: INR: 2.2
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
## 2016-12-26 NOTE — Progress Notes (Signed)
This is fine. Thanks for the update. No monitor.  Donato SchultzMark Katie Faraone, MD

## 2016-12-30 DIAGNOSIS — R5383 Other fatigue: Secondary | ICD-10-CM | POA: Diagnosis not present

## 2016-12-30 DIAGNOSIS — I4891 Unspecified atrial fibrillation: Secondary | ICD-10-CM | POA: Diagnosis not present

## 2016-12-30 DIAGNOSIS — E7800 Pure hypercholesterolemia, unspecified: Secondary | ICD-10-CM | POA: Diagnosis not present

## 2016-12-30 DIAGNOSIS — Z7901 Long term (current) use of anticoagulants: Secondary | ICD-10-CM | POA: Diagnosis not present

## 2016-12-30 DIAGNOSIS — R0602 Shortness of breath: Secondary | ICD-10-CM | POA: Diagnosis not present

## 2016-12-30 DIAGNOSIS — Z0000 Encounter for general adult medical examination without abnormal findings: Secondary | ICD-10-CM | POA: Diagnosis not present

## 2016-12-30 DIAGNOSIS — Z79899 Other long term (current) drug therapy: Secondary | ICD-10-CM | POA: Diagnosis not present

## 2016-12-30 DIAGNOSIS — E559 Vitamin D deficiency, unspecified: Secondary | ICD-10-CM | POA: Diagnosis not present

## 2016-12-30 DIAGNOSIS — Z131 Encounter for screening for diabetes mellitus: Secondary | ICD-10-CM | POA: Diagnosis not present

## 2017-01-13 DIAGNOSIS — L602 Onychogryphosis: Secondary | ICD-10-CM | POA: Diagnosis not present

## 2017-01-13 DIAGNOSIS — L84 Corns and callosities: Secondary | ICD-10-CM | POA: Diagnosis not present

## 2017-01-13 DIAGNOSIS — E559 Vitamin D deficiency, unspecified: Secondary | ICD-10-CM | POA: Diagnosis not present

## 2017-01-13 DIAGNOSIS — E7800 Pure hypercholesterolemia, unspecified: Secondary | ICD-10-CM | POA: Diagnosis not present

## 2017-01-14 ENCOUNTER — Ambulatory Visit (INDEPENDENT_AMBULATORY_CARE_PROVIDER_SITE_OTHER): Payer: PPO

## 2017-01-14 DIAGNOSIS — Z7901 Long term (current) use of anticoagulants: Secondary | ICD-10-CM

## 2017-01-14 DIAGNOSIS — I4819 Other persistent atrial fibrillation: Secondary | ICD-10-CM

## 2017-01-14 DIAGNOSIS — I481 Persistent atrial fibrillation: Secondary | ICD-10-CM

## 2017-01-14 LAB — POCT INR: INR: 2.1

## 2017-01-17 DIAGNOSIS — R600 Localized edema: Secondary | ICD-10-CM | POA: Diagnosis not present

## 2017-01-17 DIAGNOSIS — S80819A Abrasion, unspecified lower leg, initial encounter: Secondary | ICD-10-CM | POA: Diagnosis not present

## 2017-01-17 DIAGNOSIS — B351 Tinea unguium: Secondary | ICD-10-CM | POA: Diagnosis not present

## 2017-01-17 DIAGNOSIS — L84 Corns and callosities: Secondary | ICD-10-CM | POA: Diagnosis not present

## 2017-02-11 ENCOUNTER — Ambulatory Visit (INDEPENDENT_AMBULATORY_CARE_PROVIDER_SITE_OTHER): Payer: PPO | Admitting: *Deleted

## 2017-02-11 DIAGNOSIS — I481 Persistent atrial fibrillation: Secondary | ICD-10-CM

## 2017-02-11 DIAGNOSIS — Z7901 Long term (current) use of anticoagulants: Secondary | ICD-10-CM | POA: Diagnosis not present

## 2017-02-11 DIAGNOSIS — I4819 Other persistent atrial fibrillation: Secondary | ICD-10-CM

## 2017-02-11 LAB — POCT INR: INR: 1.8

## 2017-03-11 ENCOUNTER — Ambulatory Visit (INDEPENDENT_AMBULATORY_CARE_PROVIDER_SITE_OTHER): Payer: PPO | Admitting: *Deleted

## 2017-03-11 DIAGNOSIS — I4819 Other persistent atrial fibrillation: Secondary | ICD-10-CM

## 2017-03-11 DIAGNOSIS — Z7901 Long term (current) use of anticoagulants: Secondary | ICD-10-CM | POA: Diagnosis not present

## 2017-03-11 DIAGNOSIS — I481 Persistent atrial fibrillation: Secondary | ICD-10-CM | POA: Diagnosis not present

## 2017-03-11 LAB — POCT INR: INR: 2.1

## 2017-03-27 DIAGNOSIS — H5201 Hypermetropia, right eye: Secondary | ICD-10-CM | POA: Diagnosis not present

## 2017-03-27 DIAGNOSIS — H524 Presbyopia: Secondary | ICD-10-CM | POA: Diagnosis not present

## 2017-03-27 DIAGNOSIS — H52223 Regular astigmatism, bilateral: Secondary | ICD-10-CM | POA: Diagnosis not present

## 2017-04-01 ENCOUNTER — Encounter: Payer: Self-pay | Admitting: Gastroenterology

## 2017-04-08 ENCOUNTER — Ambulatory Visit (INDEPENDENT_AMBULATORY_CARE_PROVIDER_SITE_OTHER): Payer: PPO | Admitting: *Deleted

## 2017-04-08 DIAGNOSIS — I481 Persistent atrial fibrillation: Secondary | ICD-10-CM | POA: Diagnosis not present

## 2017-04-08 DIAGNOSIS — Z7901 Long term (current) use of anticoagulants: Secondary | ICD-10-CM

## 2017-04-08 DIAGNOSIS — I4819 Other persistent atrial fibrillation: Secondary | ICD-10-CM

## 2017-04-08 LAB — POCT INR: INR: 2.3

## 2017-04-19 ENCOUNTER — Other Ambulatory Visit: Payer: Self-pay | Admitting: Cardiology

## 2017-05-06 ENCOUNTER — Ambulatory Visit (INDEPENDENT_AMBULATORY_CARE_PROVIDER_SITE_OTHER): Payer: PPO | Admitting: *Deleted

## 2017-05-06 DIAGNOSIS — I481 Persistent atrial fibrillation: Secondary | ICD-10-CM | POA: Diagnosis not present

## 2017-05-06 DIAGNOSIS — Z7901 Long term (current) use of anticoagulants: Secondary | ICD-10-CM | POA: Diagnosis not present

## 2017-05-06 DIAGNOSIS — I4819 Other persistent atrial fibrillation: Secondary | ICD-10-CM

## 2017-05-06 LAB — POCT INR: INR: 2.1

## 2017-05-19 DIAGNOSIS — B351 Tinea unguium: Secondary | ICD-10-CM | POA: Diagnosis not present

## 2017-05-19 DIAGNOSIS — R600 Localized edema: Secondary | ICD-10-CM | POA: Diagnosis not present

## 2017-05-19 DIAGNOSIS — L84 Corns and callosities: Secondary | ICD-10-CM | POA: Diagnosis not present

## 2017-06-02 DIAGNOSIS — Z23 Encounter for immunization: Secondary | ICD-10-CM | POA: Diagnosis not present

## 2017-06-05 DIAGNOSIS — B351 Tinea unguium: Secondary | ICD-10-CM | POA: Diagnosis not present

## 2017-06-05 DIAGNOSIS — M79604 Pain in right leg: Secondary | ICD-10-CM | POA: Diagnosis not present

## 2017-06-05 DIAGNOSIS — M79605 Pain in left leg: Secondary | ICD-10-CM | POA: Diagnosis not present

## 2017-06-05 DIAGNOSIS — R600 Localized edema: Secondary | ICD-10-CM | POA: Diagnosis not present

## 2017-06-24 ENCOUNTER — Ambulatory Visit (INDEPENDENT_AMBULATORY_CARE_PROVIDER_SITE_OTHER): Payer: PPO | Admitting: *Deleted

## 2017-06-24 DIAGNOSIS — Z7901 Long term (current) use of anticoagulants: Secondary | ICD-10-CM

## 2017-06-24 DIAGNOSIS — I4819 Other persistent atrial fibrillation: Secondary | ICD-10-CM

## 2017-06-24 DIAGNOSIS — I481 Persistent atrial fibrillation: Secondary | ICD-10-CM

## 2017-06-24 LAB — POCT INR: INR: 1.7

## 2017-06-26 ENCOUNTER — Telehealth: Payer: Self-pay | Admitting: Cardiology

## 2017-06-26 MED ORDER — DILTIAZEM HCL ER COATED BEADS 240 MG PO CP24: 240.0000 mg | ORAL_CAPSULE | Freq: Every day | ORAL | 1 refills | Status: DC

## 2017-06-26 NOTE — Telephone Encounter (Signed)
New Message    *STAT* If patient is at the pharmacy, call can be transferred to refill team.   1. Which medications need to be refilled? (please list name of each medication and dose if known) diltiazem (CARDIZEM CD) 240 MG 24 hr capsule  2. Which pharmacy/location (including street and city if local pharmacy) is medication to be sent to?[id number], [place] - [address]  3. Do they need a 30 day or 90 day supply? 30 day/Appt scheduled for 07/31/17 with Dr. Anne FuSkains

## 2017-06-26 NOTE — Telephone Encounter (Signed)
Pt's medication was sent to pt's pharmacy as requested. Confirmation received.  °

## 2017-07-15 ENCOUNTER — Ambulatory Visit (INDEPENDENT_AMBULATORY_CARE_PROVIDER_SITE_OTHER): Payer: PPO | Admitting: Pharmacist

## 2017-07-15 DIAGNOSIS — I481 Persistent atrial fibrillation: Secondary | ICD-10-CM

## 2017-07-15 DIAGNOSIS — Z7901 Long term (current) use of anticoagulants: Secondary | ICD-10-CM | POA: Diagnosis not present

## 2017-07-15 DIAGNOSIS — I4819 Other persistent atrial fibrillation: Secondary | ICD-10-CM

## 2017-07-15 LAB — POCT INR: INR: 3.6

## 2017-07-15 NOTE — Patient Instructions (Signed)
Skip your Coumadin today, then continue same dosage 1 tablet every day except 1/2 tablet on Sundays and Thursdays. Continue eating greens 3 servings per week. Recheck INR in 3 weeks at office visit with Dr Anne FuSkains.

## 2017-07-31 ENCOUNTER — Ambulatory Visit: Payer: PPO | Admitting: Cardiology

## 2017-08-08 ENCOUNTER — Ambulatory Visit (INDEPENDENT_AMBULATORY_CARE_PROVIDER_SITE_OTHER): Payer: PPO | Admitting: *Deleted

## 2017-08-08 DIAGNOSIS — Z7901 Long term (current) use of anticoagulants: Secondary | ICD-10-CM | POA: Diagnosis not present

## 2017-08-08 DIAGNOSIS — I4819 Other persistent atrial fibrillation: Secondary | ICD-10-CM

## 2017-08-08 DIAGNOSIS — I481 Persistent atrial fibrillation: Secondary | ICD-10-CM

## 2017-08-08 DIAGNOSIS — Z5181 Encounter for therapeutic drug level monitoring: Secondary | ICD-10-CM | POA: Diagnosis not present

## 2017-08-08 LAB — POCT INR: INR: 2.8

## 2017-08-08 NOTE — Patient Instructions (Signed)
Description   Continue same dosage 1 tablet every day except 1/2 tablet on Sundays and Thursdays. Continue eating greens 3 servings per week. Recheck INR in 3 weeks.

## 2017-08-14 ENCOUNTER — Encounter: Payer: Self-pay | Admitting: Cardiology

## 2017-08-14 ENCOUNTER — Ambulatory Visit (INDEPENDENT_AMBULATORY_CARE_PROVIDER_SITE_OTHER): Payer: PPO | Admitting: Cardiology

## 2017-08-14 VITALS — BP 120/88 | HR 80 | Ht 67.0 in | Wt 195.8 lb

## 2017-08-14 DIAGNOSIS — Z79899 Other long term (current) drug therapy: Secondary | ICD-10-CM | POA: Diagnosis not present

## 2017-08-14 DIAGNOSIS — I480 Paroxysmal atrial fibrillation: Secondary | ICD-10-CM | POA: Diagnosis not present

## 2017-08-14 DIAGNOSIS — Z7901 Long term (current) use of anticoagulants: Secondary | ICD-10-CM

## 2017-08-14 NOTE — Patient Instructions (Signed)
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
## 2017-08-22 ENCOUNTER — Other Ambulatory Visit: Payer: Self-pay | Admitting: Cardiology

## 2017-08-29 ENCOUNTER — Ambulatory Visit (INDEPENDENT_AMBULATORY_CARE_PROVIDER_SITE_OTHER): Payer: PPO | Admitting: *Deleted

## 2017-08-29 DIAGNOSIS — Z7901 Long term (current) use of anticoagulants: Secondary | ICD-10-CM

## 2017-08-29 DIAGNOSIS — I481 Persistent atrial fibrillation: Secondary | ICD-10-CM

## 2017-08-29 DIAGNOSIS — Z5181 Encounter for therapeutic drug level monitoring: Secondary | ICD-10-CM | POA: Diagnosis not present

## 2017-08-29 DIAGNOSIS — I480 Paroxysmal atrial fibrillation: Secondary | ICD-10-CM

## 2017-08-29 DIAGNOSIS — I4819 Other persistent atrial fibrillation: Secondary | ICD-10-CM

## 2017-08-29 LAB — POCT INR: INR: 2

## 2017-08-29 NOTE — Patient Instructions (Signed)
Description   Continue same dosage 1 tablet every day except 1/2 tablet on Sundays and Thursdays. Continue eating greens 3 servings per week. Recheck INR in 4 weeks.

## 2017-09-21 ENCOUNTER — Other Ambulatory Visit: Payer: Self-pay | Admitting: Physician Assistant

## 2017-09-21 MED ORDER — WARFARIN SODIUM 5 MG PO TABS: ORAL_TABLET | ORAL | 1 refills | Status: DC

## 2017-09-21 NOTE — Progress Notes (Signed)
Patient run out of coumadin for 2 days, she takes 2.5 mg every THu and Sun, and 5 mg all the other days. I have send in new Rx for coumadin

## 2017-09-26 ENCOUNTER — Ambulatory Visit (INDEPENDENT_AMBULATORY_CARE_PROVIDER_SITE_OTHER): Payer: PPO

## 2017-09-26 DIAGNOSIS — Z7901 Long term (current) use of anticoagulants: Secondary | ICD-10-CM

## 2017-09-26 DIAGNOSIS — I481 Persistent atrial fibrillation: Secondary | ICD-10-CM

## 2017-09-26 DIAGNOSIS — I4819 Other persistent atrial fibrillation: Secondary | ICD-10-CM

## 2017-09-26 LAB — POCT INR: INR: 3.7

## 2017-09-26 NOTE — Patient Instructions (Signed)
Description   Skip today's dosage of Coumadin, then resume same dosage 1 tablet every day except 1/2 tablet on Sundays and Thursdays. Continue eating greens 3 servings per week. Recheck INR in 3 weeks.

## 2017-10-28 ENCOUNTER — Ambulatory Visit (INDEPENDENT_AMBULATORY_CARE_PROVIDER_SITE_OTHER): Payer: PPO | Admitting: *Deleted

## 2017-10-28 DIAGNOSIS — Z7901 Long term (current) use of anticoagulants: Secondary | ICD-10-CM

## 2017-10-28 DIAGNOSIS — I4819 Other persistent atrial fibrillation: Secondary | ICD-10-CM

## 2017-10-28 DIAGNOSIS — I481 Persistent atrial fibrillation: Secondary | ICD-10-CM

## 2017-10-28 LAB — POCT INR: INR: 2.8

## 2017-10-28 NOTE — Patient Instructions (Signed)
Description   Continue taking same dosage 1 tablet every day except 1/2 tablet on Sundays and Thursdays. Continue eating greens 3 servings per week. Recheck INR in 4 weeks.       

## 2017-11-25 ENCOUNTER — Ambulatory Visit (INDEPENDENT_AMBULATORY_CARE_PROVIDER_SITE_OTHER): Payer: PPO | Admitting: *Deleted

## 2017-11-25 DIAGNOSIS — I481 Persistent atrial fibrillation: Secondary | ICD-10-CM | POA: Diagnosis not present

## 2017-11-25 DIAGNOSIS — Z7901 Long term (current) use of anticoagulants: Secondary | ICD-10-CM | POA: Diagnosis not present

## 2017-11-25 DIAGNOSIS — Z5181 Encounter for therapeutic drug level monitoring: Secondary | ICD-10-CM | POA: Diagnosis not present

## 2017-11-25 DIAGNOSIS — I4819 Other persistent atrial fibrillation: Secondary | ICD-10-CM

## 2017-11-25 LAB — POCT INR: INR: 2.5

## 2017-11-25 NOTE — Patient Instructions (Signed)
Description   Continue taking same dosage 1 tablet every day except 1/2 tablet on Sundays and Thursdays. Continue eating greens 3 servings per week. Recheck INR in 4 weeks.       

## 2017-12-23 ENCOUNTER — Ambulatory Visit (INDEPENDENT_AMBULATORY_CARE_PROVIDER_SITE_OTHER): Payer: PPO | Admitting: *Deleted

## 2017-12-23 DIAGNOSIS — I481 Persistent atrial fibrillation: Secondary | ICD-10-CM

## 2017-12-23 DIAGNOSIS — Z7901 Long term (current) use of anticoagulants: Secondary | ICD-10-CM

## 2017-12-23 DIAGNOSIS — Z5181 Encounter for therapeutic drug level monitoring: Secondary | ICD-10-CM | POA: Diagnosis not present

## 2017-12-23 DIAGNOSIS — I4819 Other persistent atrial fibrillation: Secondary | ICD-10-CM

## 2017-12-23 LAB — POCT INR: INR: 3.1

## 2017-12-23 NOTE — Patient Instructions (Signed)
Description   Today only take 1/2 tablet, Eat a serving of leafy green vegetable today, Continue taking same dosage 1 tablet every day except 1/2 tablet on Sundays and Thursdays. Continue eating greens 3 servings per week. Recheck INR in 4 weeks.

## 2018-01-20 ENCOUNTER — Ambulatory Visit: Payer: PPO | Admitting: *Deleted

## 2018-01-20 DIAGNOSIS — I480 Paroxysmal atrial fibrillation: Secondary | ICD-10-CM | POA: Diagnosis not present

## 2018-01-20 DIAGNOSIS — I481 Persistent atrial fibrillation: Secondary | ICD-10-CM | POA: Diagnosis not present

## 2018-01-20 DIAGNOSIS — Z7901 Long term (current) use of anticoagulants: Secondary | ICD-10-CM

## 2018-01-20 DIAGNOSIS — Z5181 Encounter for therapeutic drug level monitoring: Secondary | ICD-10-CM | POA: Diagnosis not present

## 2018-01-20 DIAGNOSIS — I4819 Other persistent atrial fibrillation: Secondary | ICD-10-CM

## 2018-01-20 LAB — POCT INR: INR: 2.5 (ref 2.0–3.0)

## 2018-01-20 NOTE — Patient Instructions (Addendum)
Description   Continue taking same dosage 1 tablet every day except 1/2 tablet on Sundays and Thursdays. Continue eating greens 3 servings per week. Recheck INR in 5 weeks.

## 2018-02-24 ENCOUNTER — Ambulatory Visit: Payer: PPO | Admitting: *Deleted

## 2018-02-24 ENCOUNTER — Encounter (INDEPENDENT_AMBULATORY_CARE_PROVIDER_SITE_OTHER): Payer: Self-pay

## 2018-02-24 DIAGNOSIS — I481 Persistent atrial fibrillation: Secondary | ICD-10-CM | POA: Diagnosis not present

## 2018-02-24 DIAGNOSIS — Z5181 Encounter for therapeutic drug level monitoring: Secondary | ICD-10-CM | POA: Diagnosis not present

## 2018-02-24 DIAGNOSIS — Z7901 Long term (current) use of anticoagulants: Secondary | ICD-10-CM | POA: Diagnosis not present

## 2018-02-24 DIAGNOSIS — I4819 Other persistent atrial fibrillation: Secondary | ICD-10-CM

## 2018-02-24 LAB — POCT INR: INR: 5.6 — AB (ref 2.0–3.0)

## 2018-02-24 NOTE — Patient Instructions (Signed)
Description   Do not take any Coumadin today, No Coumadin tomorrow, and No Coumadin Thursday then continue taking same dosage 1 tablet every day except 1/2 tablet on Sundays and Thursdays. Continue eating greens 3 servings per week. Recheck INR in 10 days.

## 2018-03-06 ENCOUNTER — Ambulatory Visit: Payer: PPO | Admitting: *Deleted

## 2018-03-06 DIAGNOSIS — Z5181 Encounter for therapeutic drug level monitoring: Secondary | ICD-10-CM

## 2018-03-06 DIAGNOSIS — Z7901 Long term (current) use of anticoagulants: Secondary | ICD-10-CM | POA: Diagnosis not present

## 2018-03-06 DIAGNOSIS — I4819 Other persistent atrial fibrillation: Secondary | ICD-10-CM

## 2018-03-06 DIAGNOSIS — I481 Persistent atrial fibrillation: Secondary | ICD-10-CM

## 2018-03-06 LAB — POCT INR: INR: 2.1 (ref 2.0–3.0)

## 2018-03-06 NOTE — Patient Instructions (Signed)
Description   Continue taking same dosage 1 tablet every day except 1/2 tablet on Sundays and Thursdays. Continue eating greens 3 servings per week. Recheck INR in 4 weeks.       

## 2018-03-13 ENCOUNTER — Other Ambulatory Visit: Payer: Self-pay | Admitting: Physician Assistant

## 2018-04-03 ENCOUNTER — Ambulatory Visit: Payer: PPO | Admitting: *Deleted

## 2018-04-03 DIAGNOSIS — Z5181 Encounter for therapeutic drug level monitoring: Secondary | ICD-10-CM | POA: Diagnosis not present

## 2018-04-03 DIAGNOSIS — I481 Persistent atrial fibrillation: Secondary | ICD-10-CM

## 2018-04-03 DIAGNOSIS — I4819 Other persistent atrial fibrillation: Secondary | ICD-10-CM

## 2018-04-03 DIAGNOSIS — Z7901 Long term (current) use of anticoagulants: Secondary | ICD-10-CM | POA: Diagnosis not present

## 2018-04-03 LAB — POCT INR: INR: 2.6 (ref 2.0–3.0)

## 2018-04-03 NOTE — Patient Instructions (Signed)
Description   Continue taking same dosage 1 tablet every day except 1/2 tablet on Sundays and Thursdays. Continue eating greens 3 servings per week. Recheck INR in 4 weeks.

## 2018-05-04 ENCOUNTER — Ambulatory Visit (INDEPENDENT_AMBULATORY_CARE_PROVIDER_SITE_OTHER): Payer: PPO

## 2018-05-04 DIAGNOSIS — Z5181 Encounter for therapeutic drug level monitoring: Secondary | ICD-10-CM | POA: Diagnosis not present

## 2018-05-04 DIAGNOSIS — Z7901 Long term (current) use of anticoagulants: Secondary | ICD-10-CM

## 2018-05-04 DIAGNOSIS — I4819 Other persistent atrial fibrillation: Secondary | ICD-10-CM

## 2018-05-04 DIAGNOSIS — I481 Persistent atrial fibrillation: Secondary | ICD-10-CM

## 2018-05-04 LAB — POCT INR: INR: 2.9 (ref 2.0–3.0)

## 2018-05-04 NOTE — Patient Instructions (Signed)
Description   Continue taking same dosage 1 tablet every day except 1/2 tablet on Sundays and Thursdays. Continue eating greens 3 servings per week. Recheck INR in 6 weeks.

## 2018-05-11 DIAGNOSIS — H35361 Drusen (degenerative) of macula, right eye: Secondary | ICD-10-CM | POA: Diagnosis not present

## 2018-05-11 DIAGNOSIS — H5213 Myopia, bilateral: Secondary | ICD-10-CM | POA: Diagnosis not present

## 2018-05-11 DIAGNOSIS — H25813 Combined forms of age-related cataract, bilateral: Secondary | ICD-10-CM | POA: Diagnosis not present

## 2018-05-11 DIAGNOSIS — H43813 Vitreous degeneration, bilateral: Secondary | ICD-10-CM | POA: Diagnosis not present

## 2018-06-12 ENCOUNTER — Other Ambulatory Visit: Payer: Self-pay | Admitting: Cardiology

## 2018-06-15 ENCOUNTER — Ambulatory Visit: Payer: PPO

## 2018-06-15 DIAGNOSIS — Z5181 Encounter for therapeutic drug level monitoring: Secondary | ICD-10-CM

## 2018-06-15 DIAGNOSIS — I4819 Other persistent atrial fibrillation: Secondary | ICD-10-CM

## 2018-06-15 LAB — POCT INR: INR: 4.4 — AB (ref 2.0–3.0)

## 2018-06-15 NOTE — Patient Instructions (Signed)
Description   Do not take your Coumadin today and tomorrow. Then continue taking same dosage 1 tablet every day except 1/2 tablet on Sundays and Thursdays. Continue eating greens 3 servings per week. Recheck INR in 2 weeks.

## 2018-07-02 ENCOUNTER — Ambulatory Visit: Payer: PPO

## 2018-07-02 DIAGNOSIS — Z5181 Encounter for therapeutic drug level monitoring: Secondary | ICD-10-CM

## 2018-07-02 DIAGNOSIS — Z7901 Long term (current) use of anticoagulants: Secondary | ICD-10-CM

## 2018-07-02 DIAGNOSIS — I4819 Other persistent atrial fibrillation: Secondary | ICD-10-CM | POA: Diagnosis not present

## 2018-07-02 LAB — POCT INR: INR: 4.3 — AB (ref 2.0–3.0)

## 2018-07-02 NOTE — Patient Instructions (Signed)
Description   Do not take your Coumadin today and tomorrow, then start taking 1 tablet every day except 1/2 tablet on Sundays, Tuesdays and Thursdays. Continue eating greens 3 servings per week. Recheck INR in 2 weeks.

## 2018-07-25 ENCOUNTER — Other Ambulatory Visit: Payer: Self-pay | Admitting: Cardiology

## 2018-08-06 ENCOUNTER — Ambulatory Visit: Payer: PPO | Admitting: *Deleted

## 2018-08-06 ENCOUNTER — Encounter (INDEPENDENT_AMBULATORY_CARE_PROVIDER_SITE_OTHER): Payer: Self-pay

## 2018-08-06 DIAGNOSIS — Z5181 Encounter for therapeutic drug level monitoring: Secondary | ICD-10-CM

## 2018-08-06 DIAGNOSIS — Z7901 Long term (current) use of anticoagulants: Secondary | ICD-10-CM

## 2018-08-06 DIAGNOSIS — I4819 Other persistent atrial fibrillation: Secondary | ICD-10-CM | POA: Diagnosis not present

## 2018-08-06 LAB — POCT INR: INR: 8 — AB (ref 2.0–3.0)

## 2018-08-06 LAB — PROTIME-INR
INR: 9.9 (ref 0.8–1.2)
Prothrombin Time: 92.1 s — ABNORMAL HIGH (ref 9.1–12.0)

## 2018-08-10 ENCOUNTER — Ambulatory Visit: Payer: PPO

## 2018-08-10 DIAGNOSIS — I4819 Other persistent atrial fibrillation: Secondary | ICD-10-CM

## 2018-08-10 DIAGNOSIS — Z5181 Encounter for therapeutic drug level monitoring: Secondary | ICD-10-CM | POA: Diagnosis not present

## 2018-08-10 LAB — POCT INR: INR: 1.3 — AB (ref 2.0–3.0)

## 2018-08-10 NOTE — Patient Instructions (Signed)
Description   Take 1.5 tablets today. Then take 1 tablet tomorrow and Wednesday. Then, start taking 1/2 a tablet every day, except 1 tablet on Mondays and Fridays. Recheck INR in 1 week. Eat your normal amount of greens. Do not eat extra greens.

## 2018-08-19 ENCOUNTER — Ambulatory Visit: Payer: Medicare Other | Admitting: *Deleted

## 2018-08-19 DIAGNOSIS — Z7901 Long term (current) use of anticoagulants: Secondary | ICD-10-CM

## 2018-08-19 DIAGNOSIS — Z5181 Encounter for therapeutic drug level monitoring: Secondary | ICD-10-CM | POA: Diagnosis not present

## 2018-08-19 DIAGNOSIS — I4819 Other persistent atrial fibrillation: Secondary | ICD-10-CM | POA: Diagnosis not present

## 2018-08-19 LAB — POCT INR: INR: 1.9 — AB (ref 2.0–3.0)

## 2018-08-19 NOTE — Patient Instructions (Signed)
Description   Take 1 tablet today, then continue taking 1/2  tablet every day, except 1 tablet on Mondays and Fridays. Recheck INR in 1 week. Eat your normal amount of greens which is 3 servings each week.  Do not eat extra greens.

## 2018-08-22 ENCOUNTER — Other Ambulatory Visit: Payer: Self-pay | Admitting: Cardiology

## 2018-08-28 ENCOUNTER — Ambulatory Visit: Payer: Medicare Other | Admitting: Pharmacist

## 2018-08-28 DIAGNOSIS — I4819 Other persistent atrial fibrillation: Secondary | ICD-10-CM

## 2018-08-28 DIAGNOSIS — Z7901 Long term (current) use of anticoagulants: Secondary | ICD-10-CM

## 2018-08-28 DIAGNOSIS — Z5181 Encounter for therapeutic drug level monitoring: Secondary | ICD-10-CM | POA: Diagnosis not present

## 2018-08-28 LAB — POCT INR: INR: 5.4 — AB (ref 2.0–3.0)

## 2018-08-28 NOTE — Patient Instructions (Signed)
Description   No warfarin today or tomorrow then continue taking 1/2 tablet every day, except 1 tablet on Mondays and Fridays. Recheck INR in 1 week. Eat your normal amount of greens which is 3 servings each week.  Do not eat extra greens.

## 2018-09-04 ENCOUNTER — Ambulatory Visit: Payer: Medicare Other | Admitting: Pharmacist

## 2018-09-04 DIAGNOSIS — Z7901 Long term (current) use of anticoagulants: Secondary | ICD-10-CM | POA: Diagnosis not present

## 2018-09-04 DIAGNOSIS — Z5181 Encounter for therapeutic drug level monitoring: Secondary | ICD-10-CM | POA: Diagnosis not present

## 2018-09-04 DIAGNOSIS — I4819 Other persistent atrial fibrillation: Secondary | ICD-10-CM

## 2018-09-04 LAB — POCT INR: INR: 2.1 (ref 2.0–3.0)

## 2018-09-04 NOTE — Patient Instructions (Signed)
Continue taking 1/2 tablet every day, except 1 tablet on Mondays and Fridays. Recheck INR in 2 week. Eat your normal amount of greens which is 3 servings each week.  Do not eat extra greens.

## 2018-09-18 ENCOUNTER — Ambulatory Visit (INDEPENDENT_AMBULATORY_CARE_PROVIDER_SITE_OTHER): Payer: Medicare Other | Admitting: *Deleted

## 2018-09-18 DIAGNOSIS — Z5181 Encounter for therapeutic drug level monitoring: Secondary | ICD-10-CM

## 2018-09-18 DIAGNOSIS — Z7901 Long term (current) use of anticoagulants: Secondary | ICD-10-CM | POA: Diagnosis not present

## 2018-09-18 DIAGNOSIS — I4819 Other persistent atrial fibrillation: Secondary | ICD-10-CM | POA: Diagnosis not present

## 2018-09-18 LAB — POCT INR: INR: 3.2 — AB (ref 2.0–3.0)

## 2018-09-18 NOTE — Patient Instructions (Addendum)
Description   Today take 1/2 tablets then continue taking 1/2 tablet every day, except 1 tablet on Mondays and Fridays. Recheck INR in 2 weeks. Eat your normal amount of greens which is 3 servings each week.

## 2018-09-30 ENCOUNTER — Other Ambulatory Visit: Payer: Self-pay | Admitting: Cardiology

## 2018-10-07 ENCOUNTER — Ambulatory Visit: Payer: Medicare Other | Admitting: *Deleted

## 2018-10-07 DIAGNOSIS — Z7901 Long term (current) use of anticoagulants: Secondary | ICD-10-CM

## 2018-10-07 DIAGNOSIS — Z5181 Encounter for therapeutic drug level monitoring: Secondary | ICD-10-CM

## 2018-10-07 DIAGNOSIS — I4819 Other persistent atrial fibrillation: Secondary | ICD-10-CM | POA: Diagnosis not present

## 2018-10-07 LAB — POCT INR: INR: 2.4 (ref 2.0–3.0)

## 2018-10-07 NOTE — Patient Instructions (Signed)
Description   Continue taking 1/2 tablet every day, except 1 tablet on Mondays and Fridays. Recheck INR in 3 weeks. Eat your normal amount of greens which is 3 servings each week.

## 2018-10-25 ENCOUNTER — Other Ambulatory Visit: Payer: Self-pay | Admitting: Cardiology

## 2018-11-27 ENCOUNTER — Telehealth: Payer: Self-pay

## 2018-11-27 NOTE — Telephone Encounter (Signed)
Virtual Visit Pre-Appointment Phone Call  Steps For Call:  1. Confirm consent - "In the setting of the current Covid19 crisis, you are scheduled for a (phone or video) visit with your provider on (date) at (time).  Just as we do with many in-office visits, in order for you to participate in this visit, we must obtain consent.  If you'd like, I can send this to your mychart (if signed up) or email for you to review.  Otherwise, I can obtain your verbal consent now.  All virtual visits are billed to your insurance company just like a normal visit would be.  By agreeing to a virtual visit, we'd like you to understand that the technology does not allow for your provider to perform an examination, and thus may limit your provider's ability to fully assess your condition.  Finally, though the technology is pretty good, we cannot assure that it will always work on either your or our end, and in the setting of a video visit, we may have to convert it to a phone-only visit.  In either situation, we cannot ensure that we have a secure connection.  Are you willing to proceed?" STAFF: Did the patient verbally acknowledge consent to telehealth visit? YES  2. Confirm the BEST phone number to call the day of the visit by including in appointment notes  3. Give patient instructions for WebEx/MyChart download to smartphone as below or Doximity/Doxy.me if video visit (depending on what platform provider is using)  4. Advise patient to be prepared with their blood pressure, heart rate, weight, any heart rhythm information, their current medicines, and a piece of paper and pen handy for any instructions they may receive the day of their visit  5. Inform patient they will receive a phone call 15 minutes prior to their appointment time (may be from unknown caller ID) so they should be prepared to answer  6. Confirm that appointment type is correct in Epic appointment notes (VIDEO vs PHONE)     TELEPHONE CALL NOTE   [name] Birth has been deemed a candidate for a follow-up tele-health visit to limit community exposure during the Covid-19 pandemic. I spoke with the patient via phone to ensure availability of phone/video source, confirm preferred email & phone number, and discuss instructions and expectations.  I reminded Krystie Jameya Klingberg to be prepared with any vital sign and/or heart rhythm information that could potentially be obtained via home monitoring, at the time of her visit. I reminded Imonie Alivya Spearman to expect a phone call at the time of her visit if her visit.  Perrin Smack, [place] 11/27/2018 9:58 AM   INSTRUCTIONS FOR DOWNLOADING THE WEBEX APP TO SMARTPHONE  - If Apple, ask patient to go to App Store and type in WebEx in the search bar. Download [employer], the blue/green circle. If Android, go to [employer] and type in [employer] in the search bar. The app is free but as with any other app downloads, their phone may require them to verify saved payment information or Apple/Android password.  - The patient does NOT have to create an account. - On the day of the visit, the assist will walk the patient through joining the meeting with the meeting number/password.  INSTRUCTIONS FOR DOWNLOADING THE MYCHART APP TO SMARTPHONE  - The patient must first make sure to have activated MyChart and know their login information - If Apple, go to [employer] and type in [employer]  in the search bar and download the app. If Android, ask patient to go to [employer] and type in [place] in the search bar and download the app. The app is free but as with any other app downloads, their phone may require them to verify saved payment information or Apple/Android password.  - The patient will need to then log into the app with their MyChart username and password, and select [facility] as their healthcare provider to link the account. When it is time for your visit, go to the MyChart app, find  appointments, and click Begin Video Visit. Be sure to Select Allow for your device to access the Microphone and Camera for your visit. You will then be connected, and your provider will be with you shortly.  **If they have any issues connecting, or need assistance please contact MyChart service desk [phone])**  **If using a computer, in order to ensure the best quality for their visit they will need to use either of the following Internet Browsers: [employer], or Google Chrome**  IF USING DOXIMITY or DOXY.ME - The patient will receive a link just prior to their visit, either by text or email (to be determined day of appointment depending on if it's doxy.me or Doximity).     FULL LENGTH CONSENT FOR TELE-HEALTH VISIT   I hereby voluntarily request, consent and authorize [facility] and its employed or contracted physicians, physician assistants, nurse practitioners or other licensed health care professionals (the Practitioner), to provide me with telemedicine health care services (the "Services") as deemed necessary by the treating Practitioner. I acknowledge and consent to receive the Services by the Practitioner via telemedicine. I understand that the telemedicine visit will involve communicating with the Practitioner through live audiovisual communication technology and the disclosure of certain medical information by electronic transmission. I acknowledge that I have been given the opportunity to request an in-person assessment or other available alternative prior to the telemedicine visit and am voluntarily participating in the telemedicine visit.  I understand that I have the right to withhold or withdraw my consent to the use of telemedicine in the course of my care at any time, without affecting my right to future care or treatment, and that the Practitioner or I may terminate the telemedicine visit at any time. I understand that I have the right to inspect all  information obtained and/or recorded in the course of the telemedicine visit and may receive copies of available information for a reasonable fee.  I understand that some of the potential risks of receiving the Services via telemedicine include:  Marland Kitchen Delay or interruption in medical evaluation due to technological equipment failure or disruption; . Information transmitted may not be sufficient (e.g. poor resolution of images) to allow for appropriate medical decision making by the Practitioner; and/or  . In rare instances, security protocols could fail, causing a breach of personal health information.  Furthermore, I acknowledge that it is my responsibility to provide information about my medical history, conditions and care that is complete and accurate to the best of my ability. I acknowledge that Practitioner's advice, recommendations, and/or decision may be based on factors not within their control, such as incomplete or inaccurate data provided by me or distortions of diagnostic images or specimens that may result from electronic transmissions. I understand that the practice of medicine is not an exact science and that Practitioner makes no warranties or guarantees regarding treatment outcomes. I acknowledge that I will receive a copy of  this consent concurrently upon execution via email to the email address I last provided but may also request a printed copy by calling the office of [facility].    I understand that my insurance will be billed for this visit.   I have read or had this consent read to me. . I understand the contents of this consent, which adequately explains the benefits and risks of the Services being provided via telemedicine.  . I have been provided ample opportunity to ask questions regarding this consent and the Services and have had my questions answered to my satisfaction. . I give my informed consent for the services to be provided through the use of telemedicine in my  medical care  By participating in this telemedicine visit I agree to the above.

## 2018-12-03 ENCOUNTER — Encounter

## 2018-12-03 ENCOUNTER — Other Ambulatory Visit: Payer: Self-pay

## 2018-12-03 ENCOUNTER — Encounter: Payer: Self-pay | Admitting: Cardiology

## 2018-12-03 ENCOUNTER — Telehealth (INDEPENDENT_AMBULATORY_CARE_PROVIDER_SITE_OTHER): Payer: Medicare Other | Admitting: Cardiology

## 2018-12-03 VITALS — Ht 67.0 in

## 2018-12-03 DIAGNOSIS — I480 Paroxysmal atrial fibrillation: Secondary | ICD-10-CM | POA: Diagnosis not present

## 2018-12-03 DIAGNOSIS — Z7901 Long term (current) use of anticoagulants: Secondary | ICD-10-CM

## 2018-12-03 DIAGNOSIS — Z79899 Other long term (current) drug therapy: Secondary | ICD-10-CM
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
## 2018-12-06 ENCOUNTER — Other Ambulatory Visit: Payer: Self-pay | Admitting: Cardiology

## 2018-12-07 ENCOUNTER — Telehealth: Payer: Self-pay

## 2018-12-07 NOTE — Telephone Encounter (Signed)
lmom for prescreen  

## 2018-12-08 ENCOUNTER — Telehealth: Payer: Self-pay | Admitting: *Deleted

## 2018-12-08 ENCOUNTER — Other Ambulatory Visit: Payer: Self-pay | Admitting: *Deleted

## 2018-12-08 NOTE — Telephone Encounter (Signed)
Pt missed her March appt. Spoke with her and she also missed today's am appt, Thus rescheduled her for tomorrow. Pre-screen done. Will do refill tomorrow after appt.

## 2018-12-08 NOTE — Telephone Encounter (Signed)
F/u appt tomorrow

## 2018-12-08 NOTE — Telephone Encounter (Signed)
1. Do you currently have a fever? No, (yes = cancel and refer to pcp for e-visit) 2. Have you recently travelled on a cruise, internationally, or to [place], [place], [place], [place], [place], or [place], [place] [employer]) ? no (yes = cancel, stay home, monitor symptoms, and contact pcp or initiate e-visit if symptoms develop) 3. Have you been in contact with someone that is currently pending confirmation of Covid19 testing or has been confirmed to have the Covid19 virus?  No (yes = cancel, stay home, away from tested individual, monitor symptoms, and contact pcp or initiate e-visit if symptoms develop) 4. Are you currently experiencing fatigue or cough? No (yes = pt should be prepared to have a mask placed at the time of their visit).  Pt. Advised that we are restricting visitors at this time and anyone present in the vehicle should meet the above criteria as well. Advised that visit will be at curbside for finger stick ONLY and will receive call with instructions. Pt also advised to please bring own pen for signature of arrival document.

## 2018-12-09 ENCOUNTER — Other Ambulatory Visit: Payer: Self-pay

## 2018-12-09 ENCOUNTER — Ambulatory Visit (INDEPENDENT_AMBULATORY_CARE_PROVIDER_SITE_OTHER): Payer: Medicare Other

## 2018-12-09 ENCOUNTER — Telehealth: Payer: Self-pay

## 2018-12-09 DIAGNOSIS — I4819 Other persistent atrial fibrillation: Secondary | ICD-10-CM

## 2018-12-09 DIAGNOSIS — Z7901 Long term (current) use of anticoagulants: Secondary | ICD-10-CM

## 2018-12-09 DIAGNOSIS — Z5181 Encounter for therapeutic drug level monitoring: Secondary | ICD-10-CM

## 2018-12-09 LAB — POCT INR: INR: 5.4 — AB (ref 2.0–3.0)

## 2018-12-09 NOTE — Telephone Encounter (Signed)
Returned call to pt, see anticoagulation note in Epic. 

## 2018-12-09 NOTE — Telephone Encounter (Signed)
Follow Up:; ° ° °Returning your call. °

## 2018-12-09 NOTE — Patient Instructions (Signed)
Description   Called spoke with pt, advised to skip today and tomorrow's dosage of Coumadin, then start taking 1/2 tablet every day, except 1 tablet on Mondays. Recheck INR in 10 days. Go to the ED with bleeding problems.  Eat your normal amount of greens which is 3 servings each week.

## 2018-12-16 ENCOUNTER — Telehealth: Payer: Self-pay

## 2018-12-16 NOTE — Telephone Encounter (Signed)

## 2018-12-18 ENCOUNTER — Other Ambulatory Visit: Payer: Self-pay

## 2018-12-18 ENCOUNTER — Ambulatory Visit (INDEPENDENT_AMBULATORY_CARE_PROVIDER_SITE_OTHER): Payer: Medicare Other | Admitting: Pharmacist

## 2018-12-18 DIAGNOSIS — Z7901 Long term (current) use of anticoagulants: Secondary | ICD-10-CM | POA: Diagnosis not present

## 2018-12-18 DIAGNOSIS — Z5181 Encounter for therapeutic drug level monitoring: Secondary | ICD-10-CM

## 2018-12-18 DIAGNOSIS — I4819 Other persistent atrial fibrillation: Secondary | ICD-10-CM | POA: Diagnosis not present

## 2018-12-18 LAB — POCT INR: INR: 5.7 — AB (ref 2.0–3.0)

## 2018-12-24 ENCOUNTER — Telehealth: Payer: Self-pay

## 2018-12-24 NOTE — Telephone Encounter (Signed)
lmom for prescreen  

## 2018-12-24 NOTE — Telephone Encounter (Signed)

## 2018-12-25 ENCOUNTER — Other Ambulatory Visit: Payer: Self-pay

## 2018-12-25 ENCOUNTER — Ambulatory Visit (INDEPENDENT_AMBULATORY_CARE_PROVIDER_SITE_OTHER): Payer: Medicare Other | Admitting: Pharmacist

## 2018-12-25 DIAGNOSIS — I4819 Other persistent atrial fibrillation: Secondary | ICD-10-CM

## 2018-12-25 DIAGNOSIS — Z7901 Long term (current) use of anticoagulants: Secondary | ICD-10-CM

## 2018-12-25 DIAGNOSIS — Z5181 Encounter for therapeutic drug level monitoring: Secondary | ICD-10-CM

## 2018-12-25 LAB — POCT INR: INR: 1.4 — AB (ref 2.0–3.0)

## 2018-12-31 ENCOUNTER — Telehealth: Payer: Self-pay | Admitting: Pharmacist

## 2018-12-31 NOTE — Telephone Encounter (Signed)

## 2019-01-01 ENCOUNTER — Other Ambulatory Visit: Payer: Self-pay

## 2019-01-01 ENCOUNTER — Ambulatory Visit (INDEPENDENT_AMBULATORY_CARE_PROVIDER_SITE_OTHER): Payer: Medicare Other | Admitting: *Deleted

## 2019-01-01 DIAGNOSIS — I4819 Other persistent atrial fibrillation: Secondary | ICD-10-CM

## 2019-01-01 DIAGNOSIS — Z5181 Encounter for therapeutic drug level monitoring: Secondary | ICD-10-CM | POA: Diagnosis not present

## 2019-01-01 DIAGNOSIS — Z7901 Long term (current) use of anticoagulants: Secondary | ICD-10-CM

## 2019-01-01 LAB — POCT INR: INR: 1 — AB (ref 2.0–3.0)

## 2019-01-01 NOTE — Patient Instructions (Addendum)
  Description   Spoke with pt (24 minutes) and instructed pt to take 1.5 tablets today and take tomorrow 1 tablet then continue taking the dose you are suppose to be taking which is 1/2 tablet every day except 1 tablet on Mondays and Fridays. Repeated instructions four times for pt and had her read them back to me six times and the pt wrote them down and read them back. Recheck INR in 1 week.

## 2019-01-07 ENCOUNTER — Telehealth: Payer: Self-pay

## 2019-01-07 NOTE — Telephone Encounter (Signed)
lmom for prescreen  

## 2019-01-08 ENCOUNTER — Other Ambulatory Visit: Payer: Self-pay

## 2019-01-08 ENCOUNTER — Ambulatory Visit (INDEPENDENT_AMBULATORY_CARE_PROVIDER_SITE_OTHER): Payer: Medicare Other | Admitting: *Deleted

## 2019-01-08 DIAGNOSIS — Z7901 Long term (current) use of anticoagulants: Secondary | ICD-10-CM | POA: Diagnosis not present

## 2019-01-08 DIAGNOSIS — Z5181 Encounter for therapeutic drug level monitoring: Secondary | ICD-10-CM

## 2019-01-08 DIAGNOSIS — I4819 Other persistent atrial fibrillation: Secondary | ICD-10-CM

## 2019-01-08 LAB — POCT INR: INR: 1.2 — AB (ref 2.0–3.0)

## 2019-01-08 NOTE — Patient Instructions (Addendum)
Description   Spoke with pt (12 minutes) and instructed pt to take 1.5 tablets today and take tomorrow 1 tablet then start taking 1/2 tablet every day except 1 tablet on Mondays, Wednesdays, and Fridays. Repeated instructions two times for pt and had her read them back to me three times and the pt states she wrote them down and read them back. Recheck INR in 1 week.

## 2019-01-12 ENCOUNTER — Telehealth: Payer: Self-pay

## 2019-01-12 NOTE — Telephone Encounter (Signed)
lmom for prescreen  

## 2019-01-13 ENCOUNTER — Telehealth: Payer: Self-pay | Admitting: *Deleted

## 2019-01-13 NOTE — Telephone Encounter (Addendum)
1. COVID-19 Pre-Screening Questions:  . In the past 7 to 10 days have you had a cough,  shortness of breath, headache, congestion, fever (100 or greater) body aches, chills, sore throat, or sudden loss of taste or sense of smell? No . Have you been around anyone with known Covid 19.  No . Have you been around anyone who is awaiting Covid 19 test results in the past 7 to 10 days?  No . Have you been around anyone who has been exposed to Covid 19, or has mentioned symptoms of Covid 19 within the past 7 to 10 days? No     2. Pt advised of visitor restrictions (no visitors allowed except if needed to conduct the visit). Also advised to arrive at appointment time and wear a mask.   Pt inquired about eating spinach and she was educated again. Advised to bring pills to appt so we can assist with checking her pills to ensure correct dose and strength.

## 2019-01-15 ENCOUNTER — Other Ambulatory Visit: Payer: Self-pay

## 2019-01-15 ENCOUNTER — Ambulatory Visit (INDEPENDENT_AMBULATORY_CARE_PROVIDER_SITE_OTHER): Payer: Medicare Other | Admitting: *Deleted

## 2019-01-15 DIAGNOSIS — Z7901 Long term (current) use of anticoagulants: Secondary | ICD-10-CM | POA: Diagnosis not present

## 2019-01-15 DIAGNOSIS — I4819 Other persistent atrial fibrillation: Secondary | ICD-10-CM | POA: Diagnosis not present

## 2019-01-15 DIAGNOSIS — Z5181 Encounter for therapeutic drug level monitoring: Secondary | ICD-10-CM

## 2019-01-15 LAB — POCT INR: INR: 3.6 — AB (ref 2.0–3.0)

## 2019-01-15 MED ORDER — APIXABAN 5 MG PO TABS: 5.0000 mg | ORAL_TABLET | Freq: Two times a day (BID) | ORAL | 5 refills | Status: DC

## 2019-01-15 NOTE — Patient Instructions (Addendum)
Stop taking Coumadin. Start taking Eliquis 5mg  twice daily - first dose Sunday morning. Your first month at the pharmacy will be free. After that, the next month may be $142 (you have a $95 deductible they may charge in addition to the $47 copay). Once you pay the higher copay once, your follow up copays will be $47 per month. Call clinic with any questions #[phone]

## 2019-01-16 LAB — CBC
Hematocrit: 40 % (ref 34.0–46.6)
Hemoglobin: 12.9 g/dL (ref 11.1–15.9)
MCH: 28.9 pg (ref 26.6–33.0)
MCHC: 32.3 g/dL (ref 31.5–35.7)
MCV: 90 fL (ref 79–97)
Platelets: 284 10*3/uL (ref 150–450)
RBC: 4.47 x10E6/uL (ref 3.77–5.28)
RDW: 13.8 % (ref 11.7–15.4)
WBC: 5.6 10*3/uL (ref 3.4–10.8)

## 2019-01-16 LAB — BASIC METABOLIC PANEL
BUN/Creatinine Ratio: 17 (ref 12–28)
BUN: 13 mg/dL (ref 8–27)
CO2: 23 mmol/L (ref 20–29)
Calcium: 9.4 mg/dL (ref 8.7–10.3)
Chloride: 103 mmol/L (ref 96–106)
Creatinine, Ser: 0.77 mg/dL (ref 0.57–1.00)
GFR calc Af Amer: 89 mL/min/{1.73_m2} (ref >59)
GFR calc non Af Amer: 77 mL/min/{1.73_m2} (ref >59)
Glucose: 78 mg/dL (ref 65–99)
Potassium: 4.2 mmol/L (ref 3.5–5.2)
Sodium: 141 mmol/L (ref 134–144)

## 2019-01-18 ENCOUNTER — Telehealth: Payer: Self-pay

## 2019-01-18 NOTE — Telephone Encounter (Signed)
Notes recorded by Frederik Schmidt, RN on 01/18/2019 at 9:04 AM EDT Left patient message that labs are normal and no changes. If questions, she will call. ------

## 2019-01-18 NOTE — Telephone Encounter (Signed)
-----   Message from Jerline Pain, MD sent at 01/18/2019  6:40 AM EDT ----- Labs are excellent. No changes. Eliquis.  Candee Furbish, MD

## 2019-03-07 ENCOUNTER — Other Ambulatory Visit: Payer: Self-pay | Admitting: Cardiology

## 2019-04-13 ENCOUNTER — Encounter: Payer: Self-pay | Admitting: Podiatry

## 2019-04-13 ENCOUNTER — Other Ambulatory Visit: Payer: Self-pay | Admitting: Podiatry

## 2019-04-13 ENCOUNTER — Ambulatory Visit (INDEPENDENT_AMBULATORY_CARE_PROVIDER_SITE_OTHER): Payer: Medicare Other

## 2019-04-13 ENCOUNTER — Ambulatory Visit: Payer: Medicare Other | Admitting: Podiatry

## 2019-04-13 ENCOUNTER — Other Ambulatory Visit: Payer: Self-pay

## 2019-04-13 DIAGNOSIS — B351 Tinea unguium: Secondary | ICD-10-CM

## 2019-04-13 DIAGNOSIS — M79676 Pain in unspecified toe(s): Secondary | ICD-10-CM

## 2019-04-13 DIAGNOSIS — M779 Enthesopathy, unspecified: Secondary | ICD-10-CM

## 2019-04-13 DIAGNOSIS — M778 Other enthesopathies, not elsewhere classified: Secondary | ICD-10-CM

## 2019-04-13 DIAGNOSIS — G629 Polyneuropathy, unspecified: Secondary | ICD-10-CM | POA: Insufficient documentation

## 2019-04-13 DIAGNOSIS — Q828 Other specified congenital malformations of skin: Secondary | ICD-10-CM

## 2019-04-13 DIAGNOSIS — R609 Edema, unspecified: Secondary | ICD-10-CM

## 2019-04-13 NOTE — Progress Notes (Signed)
She presents today chief complaint of painful swollen feet and painful toenails.  Has seen Dr. Sharyon Cable 2 years ago for the same problems.  Objective: Vital signs are stable alert and oriented x3 pulses are palpable.  She has severe edema pitting in nature bilateral foot with no open lesions or wounds.  Reactive hyper keratomas plantar aspect of the bilateral foot and toenails that have at least 2 years of growth they are thick yellow dystrophic clinically mycotic and painful.  Radiographs taken today demonstrate no acute findings.  Assessment: Severe venous stasis dermatitis bilaterally and pain in limb secondary to porokeratosis and painful elongated toenails.  Plan: Debrided all reactive hyperkeratotic tissue.  Debrided toenails 1 through 5 bilateral.

## 2019-06-16 ENCOUNTER — Other Ambulatory Visit: Payer: Self-pay | Admitting: Cardiology

## 2019-06-16 NOTE — Telephone Encounter (Signed)
Eliquis 5mg  refill request received, pt is 73yrs old, weight-88.8kg, Crea-0.77 on 01/15/2019, Diagnosis-Afib, and last seen by Dr. Marlou Porch on 12/03/2018. Dose is appropriate based on dosing criteria. Will send in refill to requested pharmacy.

## 2019-07-20 ENCOUNTER — Ambulatory Visit: Payer: Medicare Other | Admitting: Podiatry

## 2019-09-23 ENCOUNTER — Encounter: Payer: Self-pay | Admitting: Neurology

## 2019-10-14 ENCOUNTER — Inpatient Hospital Stay (HOSPITAL_COMMUNITY)
Admission: EM | Admit: 2019-10-14 | Discharge: 2019-10-20 | DRG: 690 | Disposition: A | Discharge status: Skilled Nursing Facility | Payer: Medicare Other | Attending: Internal Medicine | Admitting: Internal Medicine

## 2019-10-14 ENCOUNTER — Other Ambulatory Visit: Payer: Self-pay

## 2019-10-14 ENCOUNTER — Encounter (HOSPITAL_COMMUNITY): Payer: Self-pay | Admitting: Emergency Medicine

## 2019-10-14 DIAGNOSIS — Z8349 Family history of other endocrine, nutritional and metabolic diseases: Secondary | ICD-10-CM | POA: Diagnosis not present

## 2019-10-14 DIAGNOSIS — Z880 Allergy status to penicillin: Secondary | ICD-10-CM

## 2019-10-14 DIAGNOSIS — Z96659 Presence of unspecified artificial knee joint: Secondary | ICD-10-CM | POA: Diagnosis present

## 2019-10-14 DIAGNOSIS — I480 Paroxysmal atrial fibrillation: Secondary | ICD-10-CM | POA: Diagnosis present

## 2019-10-14 DIAGNOSIS — Z833 Family history of diabetes mellitus: Secondary | ICD-10-CM | POA: Diagnosis not present

## 2019-10-14 DIAGNOSIS — Z20822 Contact with and (suspected) exposure to covid-19: Secondary | ICD-10-CM | POA: Diagnosis present

## 2019-10-14 DIAGNOSIS — Z883 Allergy status to other anti-infective agents status: Secondary | ICD-10-CM | POA: Diagnosis not present

## 2019-10-14 DIAGNOSIS — Z8249 Family history of ischemic heart disease and other diseases of the circulatory system: Secondary | ICD-10-CM

## 2019-10-14 DIAGNOSIS — Z807 Family history of other malignant neoplasms of lymphoid, hematopoietic and related tissues: Secondary | ICD-10-CM

## 2019-10-14 DIAGNOSIS — F0390 Unspecified dementia without behavioral disturbance: Secondary | ICD-10-CM | POA: Diagnosis present

## 2019-10-14 DIAGNOSIS — E876 Hypokalemia: Secondary | ICD-10-CM | POA: Diagnosis not present

## 2019-10-14 DIAGNOSIS — N12 Tubulo-interstitial nephritis, not specified as acute or chronic: Secondary | ICD-10-CM | POA: Diagnosis present

## 2019-10-14 DIAGNOSIS — B9620 Unspecified Escherichia coli [E. coli] as the cause of diseases classified elsewhere: Secondary | ICD-10-CM | POA: Diagnosis present

## 2019-10-14 DIAGNOSIS — R319 Hematuria, unspecified: Secondary | ICD-10-CM | POA: Diagnosis present

## 2019-10-14 DIAGNOSIS — I4891 Unspecified atrial fibrillation: Secondary | ICD-10-CM | POA: Diagnosis not present

## 2019-10-14 LAB — COMPREHENSIVE METABOLIC PANEL
ALT: 12 U/L (ref 0–44)
AST: 20 U/L (ref 15–41)
Albumin: 3.5 g/dL (ref 3.5–5.0)
Alkaline Phosphatase: 84 U/L (ref 38–126)
Anion gap: 10 (ref 5–15)
BUN: 19 mg/dL (ref 8–23)
CO2: 22 mmol/L (ref 22–32)
Calcium: 8.7 mg/dL — ABNORMAL LOW (ref 8.9–10.3)
Chloride: 107 mmol/L (ref 98–111)
Creatinine, Ser: 0.98 mg/dL (ref 0.44–1.00)
GFR calc Af Amer: >60 mL/min (ref >60)
GFR calc non Af Amer: 57 mL/min — ABNORMAL LOW (ref >60)
Glucose, Bld: 98 mg/dL (ref 70–99)
Potassium: 3.8 mmol/L (ref 3.5–5.1)
Sodium: 139 mmol/L (ref 135–145)
Total Bilirubin: 0.9 mg/dL (ref 0.3–1.2)
Total Protein: 7.2 g/dL (ref 6.5–8.1)

## 2019-10-14 LAB — CBC WITH DIFFERENTIAL/PLATELET
Abs Immature Granulocytes: 0.02 10*3/uL (ref 0.00–0.07)
Basophils Absolute: 0.1 10*3/uL (ref 0.0–0.1)
Basophils Relative: 1 %
Eosinophils Absolute: 0 10*3/uL (ref 0.0–0.5)
Eosinophils Relative: 1 %
HCT: 37.8 % (ref 36.0–46.0)
Hemoglobin: 12.1 g/dL (ref 12.0–15.0)
Immature Granulocytes: 0 %
Lymphocytes Relative: 19 %
Lymphs Abs: 0.9 10*3/uL (ref 0.7–4.0)
MCH: 30.1 pg (ref 26.0–34.0)
MCHC: 32 g/dL (ref 30.0–36.0)
MCV: 94 fL (ref 80.0–100.0)
Monocytes Absolute: 0.5 10*3/uL (ref 0.1–1.0)
Monocytes Relative: 11 %
Neutro Abs: 3.2 10*3/uL (ref 1.7–7.7)
Neutrophils Relative %: 68 %
Platelets: 265 10*3/uL (ref 150–400)
RBC: 4.02 MIL/uL (ref 3.87–5.11)
RDW: 15.6 % — ABNORMAL HIGH (ref 11.5–15.5)
WBC: 4.7 10*3/uL (ref 4.0–10.5)
nRBC: 0 % (ref 0.0–0.2)

## 2019-10-14 LAB — URINALYSIS, ROUTINE W REFLEX MICROSCOPIC
Bacteria, UA: NONE SEEN
Bilirubin Urine: NEGATIVE
Glucose, UA: NEGATIVE mg/dL
Ketones, ur: NEGATIVE mg/dL
Nitrite: POSITIVE — AB
Protein, ur: 100 mg/dL — AB
RBC / HPF: >50 RBC/hpf — ABNORMAL HIGH (ref 0–5)
Specific Gravity, Urine: >1.046 — ABNORMAL HIGH (ref 1.005–1.030)
pH: 7 (ref 5.0–8.0)

## 2019-10-14 LAB — RESPIRATORY PANEL BY RT PCR (FLU A&B, COVID)
Influenza A by PCR: NEGATIVE
Influenza B by PCR: NEGATIVE
SARS Coronavirus 2 by RT PCR: NEGATIVE

## 2019-10-14 MED ORDER — SODIUM CHLORIDE 0.9 % IV SOLN: 1.0000 g | INTRAVENOUS | Status: DC

## 2019-10-14 MED ORDER — SODIUM CHLORIDE 0.9 % IV SOLN
1.0000 g | INTRAVENOUS | Status: DC
  Administered 2019-10-14 – 2019-10-17 (×4): 1 g via INTRAVENOUS
  Filled 2019-10-14: qty 10
  Filled 2019-10-14 (×2): qty 1
  Filled 2019-10-14: qty 10
  Filled 2019-10-14: qty 1

## 2019-10-14 MED ORDER — DILTIAZEM HCL ER COATED BEADS 240 MG PO CP24
240.0000 mg | ORAL_CAPSULE | Freq: Every day | ORAL | Status: DC
  Administered 2019-10-15 – 2019-10-20 (×6): 240 mg via ORAL
  Filled 2019-10-14 (×6): qty 1

## 2019-10-14 MED ORDER — ACETAMINOPHEN 650 MG RE SUPP: 650.0000 mg | Freq: Four times a day (QID) | RECTAL | Status: DC | PRN

## 2019-10-14 MED ORDER — CIPROFLOXACIN IN D5W 400 MG/200ML IV SOLN: 400.0000 mg | Freq: Once | INTRAVENOUS | Status: DC

## 2019-10-14 MED ORDER — ONDANSETRON HCL 4 MG/2ML IJ SOLN
4.0000 mg | Freq: Four times a day (QID) | INTRAMUSCULAR | Status: DC | PRN
  Administered 2019-10-18: 4 mg via INTRAVENOUS
  Filled 2019-10-14: qty 2

## 2019-10-14 MED ORDER — ACETAMINOPHEN 325 MG PO TABS
650.0000 mg | ORAL_TABLET | Freq: Four times a day (QID) | ORAL | Status: DC | PRN
  Administered 2019-10-15 – 2019-10-20 (×7): 650 mg via ORAL
  Filled 2019-10-14 (×6): qty 2

## 2019-10-14 MED ORDER — ONDANSETRON HCL 4 MG PO TABS: 4.0000 mg | ORAL_TABLET | Freq: Four times a day (QID) | ORAL | Status: DC | PRN

## 2019-10-14 MED ORDER — SODIUM CHLORIDE 0.9 % IV SOLN: INTRAVENOUS | Status: DC
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
## 2019-10-14 NOTE — ED Triage Notes (Signed)
Sent by PCP for an abnormal CT, no complaints of pain. Daughter reports she had a CT scan, not sure which, but her right kidney function is impaired and something bout a stone.
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
## 2019-10-15 ENCOUNTER — Encounter (HOSPITAL_COMMUNITY)
Admission: EM | Disposition: A | Discharge status: Skilled Nursing Facility | Payer: Self-pay | Source: Home / Self Care | Attending: Internal Medicine

## 2019-10-15 ENCOUNTER — Inpatient Hospital Stay (HOSPITAL_COMMUNITY): Payer: Medicare Other

## 2019-10-15 HISTORY — PX: IR NEPHROSTOMY PLACEMENT RIGHT: IMG6064

## 2019-10-15 LAB — URINE CULTURE

## 2019-10-15 LAB — BASIC METABOLIC PANEL
Anion gap: 5 (ref 5–15)
BUN: 18 mg/dL (ref 8–23)
CO2: 22 mmol/L (ref 22–32)
Calcium: 8.4 mg/dL — ABNORMAL LOW (ref 8.9–10.3)
Chloride: 113 mmol/L — ABNORMAL HIGH (ref 98–111)
Creatinine, Ser: 0.87 mg/dL (ref 0.44–1.00)
GFR calc Af Amer: >60 mL/min (ref >60)
GFR calc non Af Amer: >60 mL/min (ref >60)
Glucose, Bld: 103 mg/dL — ABNORMAL HIGH (ref 70–99)
Potassium: 3.9 mmol/L (ref 3.5–5.1)
Sodium: 140 mmol/L (ref 135–145)

## 2019-10-15 LAB — CBC
HCT: 32.6 % — ABNORMAL LOW (ref 36.0–46.0)
Hemoglobin: 10.1 g/dL — ABNORMAL LOW (ref 12.0–15.0)
MCH: 29.6 pg (ref 26.0–34.0)
MCHC: 31 g/dL (ref 30.0–36.0)
MCV: 95.6 fL (ref 80.0–100.0)
Platelets: 208 10*3/uL (ref 150–400)
RBC: 3.41 MIL/uL — ABNORMAL LOW (ref 3.87–5.11)
RDW: 15.8 % — ABNORMAL HIGH (ref 11.5–15.5)
WBC: 5.7 10*3/uL (ref 4.0–10.5)
nRBC: 0 % (ref 0.0–0.2)

## 2019-10-15 LAB — PROTIME-INR
INR: 1 (ref 0.8–1.2)
Prothrombin Time: 13 seconds (ref 11.4–15.2)

## 2019-10-15 SURGERY — CYSTOSCOPY, FLEXIBLE, WITH STENT REPLACEMENT
Anesthesia: General | Laterality: Right

## 2019-10-15 MED ORDER — MIDAZOLAM HCL 2 MG/2ML IJ SOLN
INTRAMUSCULAR | Status: AC
  Filled 2019-10-15: qty 2

## 2019-10-15 MED ORDER — FENTANYL CITRATE (PF) 100 MCG/2ML IJ SOLN
INTRAMUSCULAR | Status: AC | PRN
  Administered 2019-10-15 (×3): 50 ug via INTRAVENOUS

## 2019-10-15 MED ORDER — LIDOCAINE HCL 1 % IJ SOLN
INTRAMUSCULAR | Status: AC
  Filled 2019-10-15: qty 20

## 2019-10-15 MED ORDER — SODIUM CHLORIDE 0.9 % IV SOLN: INTRAVENOUS | Status: AC

## 2019-10-15 MED ORDER — FENTANYL CITRATE (PF) 100 MCG/2ML IJ SOLN
INTRAMUSCULAR | Status: AC
  Filled 2019-10-15: qty 2

## 2019-10-15 MED ORDER — IOHEXOL 300 MG/ML  SOLN
50.0000 mL | Freq: Once | INTRAMUSCULAR | Status: AC | PRN
  Administered 2019-10-15: 8 mL

## 2019-10-15 MED ORDER — MIDAZOLAM HCL 2 MG/2ML IJ SOLN
INTRAMUSCULAR | Status: AC | PRN
  Administered 2019-10-15 (×4): 1 mg via INTRAVENOUS

## 2019-10-15 MED ORDER — LIDOCAINE HCL (PF) 1 % IJ SOLN
INTRAMUSCULAR | Status: AC | PRN
  Administered 2019-10-15: 10 mL via INTRADERMAL

## 2019-10-15 NOTE — Progress Notes (Addendum)
PROGRESS NOTE  [name] UYQ:[id number] DOB: September 16, 1945 DOA: 10/14/2019 PCP: Glenis Smoker, MD   LOS: 1 day   Brief narrative: As per HPI,  [name] is a 74 y.o. female with medical history significant of A.Fib on eliquis and cardizem, presented to the urology office for follow-up of of UTI treatment.  She did have a CT scan which showed stones in the right and left kidney and gas in the collecting system on the right side so urology seen the patient for admission to the hospital.  In the ED patient had a normal creatinine.  WBC is 4.7.  There was no evidence of sepsis.  Assessment/Plan:  Principal Problem:   Emphysematous pyelitis Active Problems:   Atrial fibrillation (HCC)  Emphysematous pyelitis, infected kidney stone.  Hold Eliquis.  Continue Rocephin.  Urology to see the patient today.  Urology has considered percutaneous nephrostomy by IR today.  Keep NPO.  Continue IV fluids.  Atrial fibrillation.  Continue Cardizem.  Hold Eliquis for now.  Hemodynamically stable at this time.  Last heart rate of 72.  VTE Prophylaxis: SCD  Code Status: Full code  Family Communication: I spoke with the patient's legal guardian Ms. Katie on the phone and updated her about the clinical condition of the patient.  Patient states that she does not have a primary care physician at this time.  Disposition Plan:  . Patient is from home . Likely disposition to home likely in 1 to 2 days, will get PT evaluation. . Barriers to discharge: IV antibiotics, urology consulted, IR intervention  Addendum:  10/15/2019 4:19 PM  Status post IR guided percutaneous nephrostomy tube placement.  We will start the patient on diet.  Will get PT evaluation.  Consultants:  Urology  Interventional radiology.  Procedures:  None  Antibiotics:  . IV Rocephin  Anti-infectives (From admission, onward)   Start     Dose/Rate Route Frequency Ordered Stop   10/14/19 2200  cefTRIAXone  (ROCEPHIN) 1 g in sodium chloride 0.9 % 100 mL IVPB  Status:  Discontinued     1 g 200 mL/hr over 30 Minutes Intravenous Every 24 hours 10/14/19 2124 10/14/19 2140   10/14/19 2200  cefTRIAXone (ROCEPHIN) 1 g in sodium chloride 0.9 % 100 mL IVPB     1 g 200 mL/hr over 30 Minutes Intravenous Every 24 hours 10/14/19 2143     10/14/19 2115  ciprofloxacin (CIPRO) IVPB 400 mg  Status:  Discontinued     400 mg 200 mL/hr over 60 Minutes Intravenous  Once 10/14/19 2111 10/14/19 2140     Subjective: Today, patient was seen and examined at bedside.  Denies fever chills runny nose chest pain shortness of breath.  Complains of mild back pain.  Objective: Vitals:   10/14/19 2334 10/15/19 0607  BP: 100/62 118/73  Pulse: 69 72  Resp: 20 20  Temp: 97.9 F (36.6 C) 97.8 F (36.6 C)  SpO2: 100% 98%   No intake or output data in the 24 hours ending 10/15/19 0852 Filed Weights   10/14/19 2334  Weight: 71.1 kg   Body mass index is 24.55 kg/m.   Physical Exam: GENERAL: Patient is alert awake and oriented. Not in obvious distress.  Thinly built HENT: No scleral pallor or icterus. Pupils equally reactive to light. Oral mucosa is moist NECK: is supple, no gross swelling noted. CHEST: Clear to auscultation. No crackles or wheezes.  Diminished breath sounds bilaterally. CVS: S1 and S2 heard, murmur, irregularly irregular  rhythm.   ABDOMEN: Soft, non-tender, bowel sounds are present.  Right costovertebral angle tenderness. EXTREMITIES: No edema. CNS: Cranial nerves are intact. No focal motor deficits. SKIN: warm and dry without rashes.  Data Review: I have personally reviewed the following laboratory data and studies,  CBC: Recent Labs  Lab 10/14/19 1837 10/15/19 0626  WBC 4.7 5.7  NEUTROABS 3.2  --   HGB 12.1 10.1*  HCT 37.8 32.6*  MCV 94.0 95.6  PLT 265 208   Basic Metabolic Panel: Recent Labs  Lab 10/14/19 1837 10/15/19 0626  NA 139 140  K 3.8 3.9  CL 107 113*  CO2 22 22   GLUCOSE 98 103*  BUN 19 18  CREATININE 0.98 0.87  CALCIUM 8.7* 8.4*   Liver Function Tests: Recent Labs  Lab 10/14/19 1837  AST 20  ALT 12  ALKPHOS 84  BILITOT 0.9  PROT 7.2  ALBUMIN 3.5   No results for input(s): LIPASE, AMYLASE in the last 168 hours. No results for input(s): AMMONIA in the last 168 hours. Cardiac Enzymes: No results for input(s): CKTOTAL, CKMB, CKMBINDEX, TROPONINI in the last 168 hours. BNP (last 3 results) No results for input(s): BNP in the last 8760 hours.  ProBNP (last 3 results) No results for input(s): PROBNP in the last 8760 hours.  CBG: No results for input(s): GLUCAP in the last 168 hours. Recent Results (from the past 240 hour(s))  Respiratory Panel by RT PCR (Flu A&B, Covid) - Nasopharyngeal Swab     Status: None   Collection Time: 10/14/19  6:37 PM   Specimen: Nasopharyngeal Swab  Result Value Ref Range Status   SARS Coronavirus 2 by RT PCR NEGATIVE NEGATIVE Final    Comment: (NOTE) SARS-CoV-2 target nucleic acids are NOT DETECTED. The SARS-CoV-2 RNA is generally detectable in upper respiratoy specimens during the acute phase of infection. The lowest concentration of SARS-CoV-2 viral copies this assay can detect is 131 copies/mL. A negative result does not preclude SARS-Cov-2 infection and should not be used as the sole basis for treatment or other patient management decisions. A negative result may occur with  improper specimen collection/handling, submission of specimen other than nasopharyngeal swab, presence of viral mutation(s) within the areas targeted by this assay, and inadequate number of viral copies (<131 copies/mL). A negative result must be combined with clinical observations, patient history, and epidemiological information. The expected result is Negative. Fact Sheet for Patients:  https://www.moore.com/ Fact Sheet for Healthcare Providers:  https://www.young.biz/ This test is  not yet ap proved or cleared by the [place] FDA and  has been authorized for detection and/or diagnosis of SARS-CoV-2 by FDA under an Emergency Use Authorization (EUA). This EUA will remain  in effect (meaning this test can be used) for the duration of the COVID-19 declaration under Section 564(b)(1) of the Act, 21 U.S.C. section 360bbb-3(b)(1), unless the authorization is terminated or revoked sooner.    Influenza A by PCR NEGATIVE NEGATIVE Final   Influenza B by PCR NEGATIVE NEGATIVE Final    Comment: (NOTE) The Xpert Xpress SARS-CoV-2/FLU/RSV assay is intended as an aid in  the diagnosis of influenza from Nasopharyngeal swab specimens and  should not be used as a sole basis for treatment. Nasal washings and  aspirates are unacceptable for Xpert Xpress SARS-CoV-2/FLU/RSV  testing. Fact Sheet for Patients: https://www.moore.com/ Fact Sheet for Healthcare Providers: https://www.young.biz/ This test is not yet approved or cleared by the [place] FDA and  has been authorized for detection and/or diagnosis of  SARS-CoV-2 by  FDA under an Emergency Use Authorization (EUA). This EUA will remain  in effect (meaning this test can be used) for the duration of the  Covid-19 declaration under Section 564(b)(1) of the Act, 21  U.S.C. section 360bbb-3(b)(1), unless the authorization is  terminated or revoked. Performed at [facility], [address] [place], [place] [address]      Studies: No results found.    Joycelyn Das, MD  [facility] 10/15/2019
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
## 2019-10-15 NOTE — Progress Notes (Signed)
Patient admitted last night after CT scan findings concerning for emphysematous pyelitis with multiple calculi.  Patient is scheduled for RIGHT PCN tube by IR today.  Discussed this with both patient and her POA [name] [facility]).

## 2019-10-15 NOTE — Procedures (Addendum)
Interventional Radiology Procedure Note  Procedure: Image guided right PCN.  54F drain placed.  .  Complications: None  Recommendations:  - follow up culture - Do not submerge - Routine drain care - observe for rigors.  If any new rigors/chills, contact primary team or VIR for initiation medical treatment/consider escalation   Signed,  Yvone Neu. Loreta Ave, DO
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
## 2019-10-16 LAB — COMPREHENSIVE METABOLIC PANEL
ALT: 13 U/L (ref 0–44)
AST: 21 U/L (ref 15–41)
Albumin: 3 g/dL — ABNORMAL LOW (ref 3.5–5.0)
Alkaline Phosphatase: 67 U/L (ref 38–126)
Anion gap: 6 (ref 5–15)
BUN: 10 mg/dL (ref 8–23)
CO2: 21 mmol/L — ABNORMAL LOW (ref 22–32)
Calcium: 8.4 mg/dL — ABNORMAL LOW (ref 8.9–10.3)
Chloride: 112 mmol/L — ABNORMAL HIGH (ref 98–111)
Creatinine, Ser: 0.64 mg/dL (ref 0.44–1.00)
GFR calc Af Amer: >60 mL/min (ref >60)
GFR calc non Af Amer: >60 mL/min (ref >60)
Glucose, Bld: 93 mg/dL (ref 70–99)
Potassium: 3.1 mmol/L — ABNORMAL LOW (ref 3.5–5.1)
Sodium: 139 mmol/L (ref 135–145)
Total Bilirubin: 1.5 mg/dL — ABNORMAL HIGH (ref 0.3–1.2)
Total Protein: 6.2 g/dL — ABNORMAL LOW (ref 6.5–8.1)

## 2019-10-16 LAB — CBC
HCT: 35.5 % — ABNORMAL LOW (ref 36.0–46.0)
Hemoglobin: 10.9 g/dL — ABNORMAL LOW (ref 12.0–15.0)
MCH: 29.9 pg (ref 26.0–34.0)
MCHC: 30.7 g/dL (ref 30.0–36.0)
MCV: 97.3 fL (ref 80.0–100.0)
Platelets: 210 10*3/uL (ref 150–400)
RBC: 3.65 MIL/uL — ABNORMAL LOW (ref 3.87–5.11)
RDW: 15.9 % — ABNORMAL HIGH (ref 11.5–15.5)
WBC: 7.2 10*3/uL (ref 4.0–10.5)
nRBC: 0 % (ref 0.0–0.2)

## 2019-10-16 LAB — MAGNESIUM: Magnesium: 2 mg/dL (ref 1.7–2.4)

## 2019-10-16 MED ORDER — POTASSIUM CHLORIDE 10 MEQ/100ML IV SOLN
10.0000 meq | INTRAVENOUS | Status: AC
  Administered 2019-10-16 (×4): 10 meq via INTRAVENOUS
  Filled 2019-10-16 (×4): qty 100

## 2019-10-16 MED ORDER — APIXABAN 5 MG PO TABS
5.0000 mg | ORAL_TABLET | Freq: Two times a day (BID) | ORAL | Status: DC
  Administered 2019-10-16 – 2019-10-20 (×8): 5 mg via ORAL
  Filled 2019-10-16 (×5): qty 1
  Filled 2019-10-16: qty 2
  Filled 2019-10-16 (×2): qty 1
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
## 2019-10-16 NOTE — Progress Notes (Signed)
Subjective: Pain well controlled. Nephrostomy tube in place, draining well per RN.  Procedural culture from nephrostomy tube placement (3/5): GNR  Objective: Vital signs in last 24 hours: Temp:  [97.5 F (36.4 C)-98.6 F (37 C)] 98.6 F (37 C) (03/06 0545) Pulse Rate:  [66-75] 66 (03/06 0545) Resp:  [16-19] 19 (03/06 0545) BP: (96-131)/(58-77) 125/77 (03/06 0545) SpO2:  [97 %-100 %] 100 % (03/06 0545)  Intake/Output from previous day: 03/05 0701 - 03/06 0700 In: 3399.8 [I.V.:2956.5; IV Piggyback:93.3] Out: 600 [Urine:600] Intake/Output this shift: Total I/O In: 450 [Other:450] Out: -   Physical Exam:  General: Alert, pleasant, conversive CV: Regular rate Lungs: Normal work of breathing.  GU: nephrostomy tube in place with scant clear urine in bag  Lab Results: Recent Labs    10/14/19 1837 10/15/19 0626 10/16/19 0600  HGB 12.1 10.1* 10.9*  HCT 37.8 32.6* 35.5*   BMET Recent Labs    10/15/19 0626 10/16/19 0600  NA 140 139  K 3.9 3.1*  CL 113* 112*  CO2 22 21*  GLUCOSE 103* 93  BUN 18 10  CREATININE 0.87 0.64  CALCIUM 8.4* 8.4*     Studies/Results: IR NEPHROSTOMY PLACEMENT RIGHT  Result Date: 10/15/2019 INDICATION: 74 year old female with a history of emphysematous pyelitis EXAM: IR NEPHROSTOMY PLACEMENT RIGHT COMPARISON:  None. MEDICATIONS: 1 g Rocephin; The antibiotic was administered in an appropriate time frame prior to skin puncture. ANESTHESIA/SEDATION: Fentanyl 150 mcg IV; Versed 4.0 mg IV Moderate Sedation Time:  13 minutes The patient was continuously monitored during the procedure by the interventional radiology nurse under my direct supervision. CONTRAST:  39mL OMNIPAQUE IOHEXOL 300 MG/ML SOLN - administered into the collecting system(s) FLUOROSCOPY TIME:  Fluoroscopy Time: 1 minutes 6 seconds (18 mGy). COMPLICATIONS: None PROCEDURE: Informed written consent was obtained from the patient after a thorough discussion of the procedural risks, benefits  and alternatives. All questions were addressed. Maximal Sterile Barrier Technique was utilized including caps, mask, sterile gowns, sterile gloves, sterile drape, hand hygiene and skin antiseptic. A timeout was performed prior to the initiation of the procedure. Patient positioned prone position on the fluoroscopy table. Ultrasound survey of the right flank was performed with images stored and sent to PACs. The patient was then prepped and draped in the usual sterile fashion. 1% lidocaine was used to anesthetize the skin and subcutaneous tissues for local anesthesia. A Chiba needle was then used to access a posterior inferior calyx with ultrasound guidance. With spontaneous urine returned through the needle, passage of an 018 micro wire into the collecting system was performed under fluoroscopy. A small incision was made with an 11 blade scalpel, and the needle was removed from the wire. An Accustick system was then advanced over the wire into the collecting system under fluoroscopy. The metal stiffener and inner dilator were removed, and then a sample of fluid was aspirated through the 4 French outer sheath. Bentson wire was passed into the collecting system and the sheath removed. Ten French dilation of the soft tissues was performed. Using modified Seldinger technique, a 10 French pigtail catheter drain was placed over the Bentson wire. Wire and inner stiffener removed, and the pigtail was formed in the collecting system. Small amount of contrast confirmed position of the catheter. Sample was sent for culture. Patient tolerated the procedure well and remained hemodynamically stable throughout. No complications were encountered and no significant blood loss encountered IMPRESSION: Status post right-sided percutaneous nephrostomy. Signed, Yvone Neu. Reyne Dumas, RPVI [facility]  [facility] Radiology Electronically Signed   By: Corrie Mckusick D.O.   On: 10/15/2019 12:49     Impression/Assessment:  74yo with right empysematous pyelitis/XGP kidney  Plan:  1. Please continue broad spectrum antibiotics pending her culture results. 2. Continue right nephrostomy tube to drainage.    LOS: 2 days   Haskel Schroeder 10/16/2019, 11:37 AM

## 2019-10-16 NOTE — Progress Notes (Signed)
PROGRESS NOTE  [name] QJJ:[id number] DOB: [birth year] DOA: 10/14/2019 PCP: Glenis Smoker, MD  HPI/Recap of past 24 hours: [name] a 74 y.o.femalewith medical history significant ofA.Fib on eliquis and cardizem, presented to the urology office for follow-up of of UTI treatment.  She did have a CT scan which showed stones in the right and left kidney and gas in the collecting system on the right side so urology seen the patient for admission to the hospital.  In the ED patient had a normal creatinine.  WBC is 4.7.  There was no evidence of sepsis.  Post nephrostomy tube placement on 10/15/2019 by interventional radiology Dr. Earleen Newport.  10/16/19: Seen and examined.  Her pain is well controlled at the time of this visit.  She has intermittent agitation.  One-to-one sitter in place for patient's own safety.  Assessment/Plan: Principal Problem:   Emphysematous pyelitis Active Problems:   Atrial fibrillation (HCC)  Emphysematous pyelitis/XGP kidney status post right-sided percutaneous nephrostomy 10/15/19 by IR DR. Wagner. Eliquis on hold Continue Rocephin until body fluid culture results. Urology and IR following. Continue gentle IV fluid hydration   Hypokalemia Potassium 3.1 Repleted with IV potassium supplement 10 mEq x 4. Magnesium 2.0  Paroxysmal A. fib Rate is well controlled on p.o. Cardizem Eliquis is on hold   Possible dementia with delirium At the time of this visit she is able to answer questions appropriately however she has episodes of agitation One-to-one in place for patient's own safety Start Seroquel for delirium  Ambulatory dysfunction PT assessment recommended SNF with 24 hours assistance/supervision Continue PT OT with assistance and fall precautions.   VTE Prophylaxis: SCD  Code Status: Full code  Family Communication: Patient states she does not have any children.  She requests updates being given to her pastor.  Disposition  Plan:   Patient is from home  Likely disposition to SNF once blood fluid cultures result.     Consultants:  Urology  Interventional radiology.  Procedures: Status post IR guided percutaneous nephrostomy tube placement on 10/15/19 by interventional radiology Dr. Earleen Newport.    Objective: Vitals:   10/15/19 1437 10/15/19 2010 10/16/19 0545 10/16/19 1258  BP: 105/67 (!) 96/58 125/77 130/84  Pulse: 67 71 66 77  Resp: 16 19 19 18   Temp: 98 F (36.7 C) (!) 97.5 F (36.4 C) 98.6 F (37 C) 98.7 F (37.1 C)  TempSrc: Oral Oral Oral Oral  SpO2: 98% 97% 100% 99%  Weight:        Intake/Output Summary (Last 24 hours) at 10/16/2019 1318 Last data filed at 10/16/2019 1300 Gross per 24 hour  Intake 3969.81 ml  Output 600 ml  Net 3369.81 ml   Filed Weights   10/14/19 2334  Weight: 71.1 kg    Exam:  . General: 74 y.o. year-old female well developed well nourished in no acute distress.  Alert and interactive. . Cardiovascular: Regular rate and rhythm with no rubs or gallops.  No thyromegaly or JVD noted.   Marland Kitchen Respiratory: Clear to auscultation with no wheezes or rales. Good inspiratory effort. . Abdomen: Soft nontender nondistended with normal bowel sounds x4 quadrants. . Musculoskeletal: Trace lower extremity edema bilaterally . Psychiatry: Mood is appropriate for condition and setting   Data Reviewed: CBC: Recent Labs  Lab 10/14/19 1837 10/15/19 0626 10/16/19 0600  WBC 4.7 5.7 7.2  NEUTROABS 3.2  --   --   HGB 12.1 10.1* 10.9*  HCT 37.8 32.6* 35.5*  MCV 94.0 95.6 97.3  PLT 265 208 210   Basic Metabolic Panel: Recent Labs  Lab 10/14/19 1837 10/15/19 0626 10/16/19 0600  NA 139 140 139  K 3.8 3.9 3.1*  CL 107 113* 112*  CO2 22 22 21*  GLUCOSE 98 103* 93  BUN 19 18 10   CREATININE 0.98 0.87 0.64  CALCIUM 8.7* 8.4* 8.4*  MG  --   --  2.0   GFR: CrCl cannot be calculated (Unknown ideal weight.). Liver Function Tests: Recent Labs  Lab 10/14/19 1837  10/16/19 0600  AST 20 21  ALT 12 13  ALKPHOS 84 67  BILITOT 0.9 1.5*  PROT 7.2 6.2*  ALBUMIN 3.5 3.0*   No results for input(s): LIPASE, AMYLASE in the last 168 hours. No results for input(s): AMMONIA in the last 168 hours. Coagulation Profile: Recent Labs  Lab 10/15/19 0906  INR 1.0   Cardiac Enzymes: No results for input(s): CKTOTAL, CKMB, CKMBINDEX, TROPONINI in the last 168 hours. BNP (last 3 results) No results for input(s): PROBNP in the last 8760 hours. HbA1C: No results for input(s): HGBA1C in the last 72 hours. CBG: No results for input(s): GLUCAP in the last 168 hours. Lipid Profile: No results for input(s): CHOL, HDL, LDLCALC, TRIG, CHOLHDL, LDLDIRECT in the last 72 hours. Thyroid Function Tests: No results for input(s): TSH, T4TOTAL, FREET4, T3FREE, THYROIDAB in the last 72 hours. Anemia Panel: No results for input(s): VITAMINB12, FOLATE, FERRITIN, TIBC, IRON, RETICCTPCT in the last 72 hours. Urine analysis:    Component Value Date/Time   COLORURINE YELLOW 10/14/2019 2105   APPEARANCEUR CLOUDY (A) 10/14/2019 2105   LABSPEC >1.046 (H) 10/14/2019 2105   PHURINE 7.0 10/14/2019 2105   GLUCOSEU NEGATIVE 10/14/2019 2105   HGBUR LARGE (A) 10/14/2019 2105   BILIRUBINUR NEGATIVE 10/14/2019 2105   KETONESUR NEGATIVE 10/14/2019 2105   PROTEINUR 100 (A) 10/14/2019 2105   UROBILINOGEN 2.0 (H) 10/14/2013 1209   NITRITE POSITIVE (A) 10/14/2019 2105   LEUKOCYTESUR LARGE (A) 10/14/2019 2105   Sepsis Labs: @LABRCNTIP (procalcitonin:4,lacticidven:4)  ) Recent Results (from the past 240 hour(s))  Respiratory Panel by RT PCR (Flu A&B, Covid) - Nasopharyngeal Swab     Status: None   Collection Time: 10/14/19  6:37 PM   Specimen: Nasopharyngeal Swab  Result Value Ref Range Status   SARS Coronavirus 2 by RT PCR NEGATIVE NEGATIVE Final    Comment: (NOTE) SARS-CoV-2 target nucleic acids are NOT DETECTED. The SARS-CoV-2 RNA is generally detectable in upper  respiratoy specimens during the acute phase of infection. The lowest concentration of SARS-CoV-2 viral copies this assay can detect is 131 copies/mL. A negative result does not preclude SARS-Cov-2 infection and should not be used as the sole basis for treatment or other patient management decisions. A negative result may occur with  improper specimen collection/handling, submission of specimen other than nasopharyngeal swab, presence of viral mutation(s) within the areas targeted by this assay, and inadequate number of viral copies (<131 copies/mL). A negative result must be combined with clinical observations, patient history, and epidemiological information. The expected result is Negative. Fact Sheet for Patients:  Fact Sheet for Healthcare Providers:  12/14/19 This test is not yet ap proved or cleared by the https://www.moore.com/ FDA and  has been authorized for detection and/or diagnosis of SARS-CoV-2 by FDA under an Emergency Use Authorization (EUA). This EUA will remain  in effect (meaning this test can be used) for the duration of the COVID-19 declaration under Section 564(b)(1) of the Act, 21 U.S.C. section 360bbb-3(b)(1), unless  the authorization is terminated or revoked sooner.    Influenza A by PCR NEGATIVE NEGATIVE Final   Influenza B by PCR NEGATIVE NEGATIVE Final    Comment: (NOTE) The Xpert Xpress SARS-CoV-2/FLU/RSV assay is intended as an aid in  the diagnosis of influenza from Nasopharyngeal swab specimens and  should not be used as a sole basis for treatment. Nasal washings and  aspirates are unacceptable for Xpert Xpress SARS-CoV-2/FLU/RSV  testing. Fact Sheet for Patients: https://www.moore.com/ Fact Sheet for Healthcare Providers: https://www.young.biz/ This test is not yet approved or cleared by the [place] FDA and  has been authorized for  detection and/or diagnosis of SARS-CoV-2 by  FDA under an Emergency Use Authorization (EUA). This EUA will remain  in effect (meaning this test can be used) for the duration of the  Covid-19 declaration under Section 564(b)(1) of the Act, 21  U.S.C. section 360bbb-3(b)(1), unless the authorization is  terminated or revoked. Performed at [facility], [address], [place], [place] [address]   Urine Culture     Status: Abnormal   Collection Time: 10/14/19  9:05 PM   Specimen: Urine, Clean Catch  Result Value Ref Range Status   Specimen Description   Final    URINE, CLEAN CATCH Performed at [facility], [address], [place], [place] [address]    Special Requests   Final    NONE Performed at [facility], [address] [place], [place] [address]    Culture MULTIPLE SPECIES PRESENT, SUGGEST RECOLLECTION (A)  Final   Report Status 10/15/2019 FINAL  Final  Aerobic/Anaerobic Culture (surgical/deep wound)     Status: None (Preliminary result)   Collection Time: 10/15/19 11:33 AM   Specimen: Kidney; Abscess  Result Value Ref Range Status   Specimen Description   Final    KIDNEY Performed at [facility], [address] [place], [place] [address]    Special Requests   Final    RIGHT Performed at [facility], [address], [place], [place] [address]    Gram Stain   Final    ABUNDANT WBC PRESENT, PREDOMINANTLY PMN ABUNDANT GRAM NEGATIVE RODS Performed at [facility], [address] [place], [place] [address]    Culture ABUNDANT GRAM NEGATIVE RODS  Final   Report Status PENDING  Incomplete      Studies: No results found.  Scheduled Meds: . diltiazem  240 mg Oral Daily    Continuous Infusions: . sodium chloride 90 mL/hr at 10/16/19 0954  . cefTRIAXone (ROCEPHIN)  IV Stopped (10/15/19 1123)     LOS: 2 days     Darlin Drop, MD [facility] Pager  [phone]  If 7PM-7AM, please contact night-coverage [email or web address] Password [email or web address] 10/16/2019, 1:18 PM

## 2019-10-16 NOTE — Progress Notes (Signed)
Updated the patient's legal guardian Ms. [name] via phone.  All questions answered.

## 2019-10-17 LAB — POTASSIUM: Potassium: 3.9 mmol/L (ref 3.5–5.1)

## 2019-10-17 MED ORDER — POTASSIUM CHLORIDE CRYS ER 20 MEQ PO TBCR: 40.0000 meq | EXTENDED_RELEASE_TABLET | Freq: Two times a day (BID) | ORAL | Status: DC

## 2019-10-17 MED ORDER — QUETIAPINE FUMARATE 25 MG PO TABS
25.0000 mg | ORAL_TABLET | Freq: Every day | ORAL | Status: DC
  Administered 2019-10-17 – 2019-10-19 (×3): 25 mg via ORAL
  Filled 2019-10-17 (×3): qty 1

## 2019-10-17 NOTE — Progress Notes (Signed)
Subjective: Pain well controlled. Nephrostomy tube in place, draining well  Procedural culture from nephrostomy tube placement (3/5): E coli sensitivities pending. Patient on CTX.  Objective: Vital signs in last 24 hours: Temp:  [97.5 F (36.4 C)-98.7 F (37.1 C)] 97.5 F (36.4 C) (03/07 0647) Pulse Rate:  [70-90] 70 (03/07 0647) Resp:  [16-20] 16 (03/07 0647) BP: (130-135)/(74-84) 135/83 (03/07 0647) SpO2:  [99 %-100 %] 100 % (03/07 0647)  Intake/Output from previous day: 03/06 0701 - 03/07 0700 In: 2108.6 [P.O.:600; I.V.:858.6; IV Piggyback:200] Out: 550 [Urine:550] Intake/Output this shift: No intake/output data recorded.  Physical Exam:  General: Alert, pleasant, conversive CV: Regular rate Lungs: Normal work of breathing.  GU: nephrostomy tube in place with scant clear urine in bag  Lab Results: Recent Labs    10/14/19 1837 10/15/19 0626 10/16/19 0600  HGB 12.1 10.1* 10.9*  HCT 37.8 32.6* 35.5*   BMET Recent Labs    10/15/19 0626 10/16/19 0600  NA 140 139  K 3.9 3.1*  CL 113* 112*  CO2 22 21*  GLUCOSE 103* 93  BUN 18 10  CREATININE 0.87 0.64  CALCIUM 8.4* 8.4*     Studies/Results: IR NEPHROSTOMY PLACEMENT RIGHT  Result Date: 10/15/2019 INDICATION: 75 year old female with a history of emphysematous pyelitis EXAM: IR NEPHROSTOMY PLACEMENT RIGHT COMPARISON:  None. MEDICATIONS: 1 g Rocephin; The antibiotic was administered in an appropriate time frame prior to skin puncture. ANESTHESIA/SEDATION: Fentanyl 150 mcg IV; Versed 4.0 mg IV Moderate Sedation Time:  13 minutes The patient was continuously monitored during the procedure by the interventional radiology nurse under my direct supervision. CONTRAST:  40mL OMNIPAQUE IOHEXOL 300 MG/ML SOLN - administered into the collecting system(s) FLUOROSCOPY TIME:  Fluoroscopy Time: 1 minutes 6 seconds (18 mGy). COMPLICATIONS: None PROCEDURE: Informed written consent was obtained from the patient after a thorough  discussion of the procedural risks, benefits and alternatives. All questions were addressed. Maximal Sterile Barrier Technique was utilized including caps, mask, sterile gowns, sterile gloves, sterile drape, hand hygiene and skin antiseptic. A timeout was performed prior to the initiation of the procedure. Patient positioned prone position on the fluoroscopy table. Ultrasound survey of the right flank was performed with images stored and sent to PACs. The patient was then prepped and draped in the usual sterile fashion. 1% lidocaine was used to anesthetize the skin and subcutaneous tissues for local anesthesia. A Chiba needle was then used to access a posterior inferior calyx with ultrasound guidance. With spontaneous urine returned through the needle, passage of an 018 micro wire into the collecting system was performed under fluoroscopy. A small incision was made with an 11 blade scalpel, and the needle was removed from the wire. An Accustick system was then advanced over the wire into the collecting system under fluoroscopy. The metal stiffener and inner dilator were removed, and then a sample of fluid was aspirated through the 4 French outer sheath. Bentson wire was passed into the collecting system and the sheath removed. Ten French dilation of the soft tissues was performed. Using modified Seldinger technique, a 10 French pigtail catheter drain was placed over the Bentson wire. Wire and inner stiffener removed, and the pigtail was formed in the collecting system. Small amount of contrast confirmed position of the catheter. Sample was sent for culture. Patient tolerated the procedure well and remained hemodynamically stable throughout. No complications were encountered and no significant blood loss encountered IMPRESSION: Status post right-sided percutaneous nephrostomy. Signed, Dulcy Fanny. Dellia Nims, [place] [facility] Radiology Electronically Signed   By: Gilmer Mor D.O.   On: 10/15/2019 12:49    Impression/Assessment:  74yo with right empysematous pyelitis/XGP kidney  Plan:  1. Please continue broad spectrum antibiotics pending her culture results. Currently growing E coli. Adjust antibiotics when sensitivities result.  2. Continue right nephrostomy tube to drainage.    LOS: 3 days   Roxanne Gates 10/17/2019, 8:23 AM

## 2019-10-17 NOTE — Progress Notes (Signed)
Referring Physician(s): Dr. Jeffie Pollock  Supervising Physician: Sandi Mariscal  Patient Status:  [facility] - In-pt  Chief Complaint: Emphysematous pyleonephritis  Subjective: Pleasantly confused; "I have ankles today!" Denies pain.  Comfortably resting in chair.  R PCN remains in place.   Allergies: Penicillins  Medications: Prior to Admission medications   Medication Sig Start Date End Date Taking? Authorizing Provider  acetaminophen (TYLENOL 8 HOUR ARTHRITIS PAIN) 650 MG CR tablet Take 650 mg by mouth every 8 (eight) hours as needed for pain.   Yes [provider]  diltiazem (CARDIZEM CD) 240 MG 24 hr capsule TAKE 1 CAPSULE BY MOUTH DAILY. MUST KEEP UPCOMING APPT IN APRIL TO GET FURTHER REFILLS Patient taking differently: Take 240 mg by mouth daily.  12/08/18  Yes Skains, Thana Farr, MD  ELIQUIS 5 MG TABS tablet TAKE 1 TABLET BY MOUTH TWICE A DAY Patient taking differently: Take 5 mg by mouth 2 (two) times daily.  06/16/19  Yes Jerline Pain, MD     Vital Signs: BP 135/83 (BP Location: Left Arm)   Pulse 70   Temp (!) 97.5 F (36.4 C) (Oral)   Resp 16   Wt 156 lb 12 oz (71.1 kg)   SpO2 100%   BMI 24.55 kg/m   Physical Exam  NAD, confused Back: R PCN in place.  Insertion site c/d/i.  Small out of yellow urine in collection bag. 550 out yesterday.   Imaging: IR NEPHROSTOMY PLACEMENT RIGHT  Result Date: 10/15/2019 INDICATION: 74 year old female with a history of emphysematous pyelitis EXAM: IR NEPHROSTOMY PLACEMENT RIGHT COMPARISON:  None. MEDICATIONS: 1 g Rocephin; The antibiotic was administered in an appropriate time frame prior to skin puncture. ANESTHESIA/SEDATION: Fentanyl 150 mcg IV; Versed 4.0 mg IV Moderate Sedation Time:  13 minutes The patient was continuously monitored during the procedure by the interventional radiology nurse under my direct supervision. CONTRAST:  35mL OMNIPAQUE IOHEXOL 300 MG/ML SOLN - administered into the collecting system(s) FLUOROSCOPY TIME:   Fluoroscopy Time: 1 minutes 6 seconds (18 mGy). COMPLICATIONS: None PROCEDURE: Informed written consent was obtained from the patient after a thorough discussion of the procedural risks, benefits and alternatives. All questions were addressed. Maximal Sterile Barrier Technique was utilized including caps, mask, sterile gowns, sterile gloves, sterile drape, hand hygiene and skin antiseptic. A timeout was performed prior to the initiation of the procedure. Patient positioned prone position on the fluoroscopy table. Ultrasound survey of the right flank was performed with images stored and sent to PACs. The patient was then prepped and draped in the usual sterile fashion. 1% lidocaine was used to anesthetize the skin and subcutaneous tissues for local anesthesia. A Chiba needle was then used to access a posterior inferior calyx with ultrasound guidance. With spontaneous urine returned through the needle, passage of an 018 micro wire into the collecting system was performed under fluoroscopy. A small incision was made with an 11 blade scalpel, and the needle was removed from the wire. An Accustick system was then advanced over the wire into the collecting system under fluoroscopy. The metal stiffener and inner dilator were removed, and then a sample of fluid was aspirated through the 4 French outer sheath. Bentson wire was passed into the collecting system and the sheath removed. Ten French dilation of the soft tissues was performed. Using modified Seldinger technique, a 10 French pigtail catheter drain was placed over the Bentson wire. Wire and inner stiffener removed, and the pigtail was formed in the collecting system. Small amount of  contrast confirmed position of the catheter. Sample was sent for culture. Patient tolerated the procedure well and remained hemodynamically stable throughout. No complications were encountered and no significant blood loss encountered IMPRESSION: Status post right-sided percutaneous  nephrostomy. Signed, Yvone Neu. Reyne Dumas, [facility] Radiology Electronically Signed   By: Gilmer Mor D.O.   On: 10/15/2019 12:49    Labs:  CBC: Recent Labs    01/15/19 1332 10/14/19 1837 10/15/19 0626 10/16/19 0600  WBC 5.6 4.7 5.7 7.2  HGB 12.9 12.1 10.1* 10.9*  HCT 40.0 37.8 32.6* 35.5*  PLT 284 265 208 210    COAGS: Recent Labs    01/01/19 1029 01/08/19 1031 01/15/19 1315 10/15/19 0906  INR 1.0* 1.2* 3.6* 1.0    BMP: Recent Labs    01/15/19 1332 10/14/19 1837 10/15/19 0626 10/16/19 0600  NA 141 139 140 139  K 4.2 3.8 3.9 3.1*  CL 103 107 113* 112*  CO2 23 22 22  21*  GLUCOSE 78 98 103* 93  BUN 13 19 18 10   CALCIUM 9.4 8.7* 8.4* 8.4*  CREATININE 0.77 0.98 0.87 0.64  GFRNONAA 77 57* >60 >60  GFRAA 89 >60 >60 >60    LIVER FUNCTION TESTS: Recent Labs    10/14/19 1837 10/16/19 0600  BILITOT 0.9 1.5*  AST 20 21  ALT 12 13  ALKPHOS 84 67  PROT 7.2 6.2*  ALBUMIN 3.5 3.0*    Assessment and Plan: Empysematous pyelonephritis s/p R PCN placement 3/5 Drain in place today without issues.  Yellow urine in collection bag.  550 mL output yesterday + urine.  Cultures pending; growing abundant E coli.  Continue drain care. Continue management per [facility] and Urology.   Electronically Signed: 12/16/19, PA 10/17/2019, 7:36 AM   I spent a total of 15 Minutes at the the patient's bedside AND on the patient's hospital floor or unit, greater than 50% of which was counseling/coordinating care for emphysematous pyelonephritis.
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
## 2019-10-17 NOTE — Progress Notes (Signed)
PROGRESS NOTE  [name] ZWC:[id number] DOB: 1946/02/06 DOA: 10/14/2019 PCP: Shon Hale, MD  HPI/Recap of past 24 hours: Melissa Faith Olsonis a 74 y.o.femalewith medical history significant ofA.Fib on eliquis and cardizem, presented to the urology office for follow-up of of UTI treatment.  She did have a CT scan which showed stones in the right and left kidney and gas in the collecting system on the right side so urology seen the patient for admission to the hospital.  In the ED patient had a normal creatinine.  WBC is 4.7.  There was no evidence of sepsis.  Post nephrostomy tube placement on 10/15/2019 by interventional radiology Dr. Loreta Ave.  She has intermittent agitation.    10/17/19: Seen and examined this morning.  No acute events overnight.  This morning she is alert and oriented x3.  Pain is well controlled.  Deep wound culture grew abundant E. coli, pansensitive.  Assessment/Plan: Principal Problem:   Emphysematous pyelitis Active Problems:   Atrial fibrillation (HCC)  Emphysematous pyelitis/XGP kidney status post right-sided percutaneous nephrostomy 10/15/19 by IR DR. Wagner. Eliquis on hold Deep wound culture grew abundant E. coli, pansensitive. On Rocephin day #3, will switch to oral antibiotics tomorrow. Appreciate assistance from interventional radiology and urology. Continue gentle IV fluid hydration   Hypokalemia Potassium 3.1, repleted Repeat serum potassium level. Magnesium 2.0.  Paroxysmal A. fib Rate is well controlled on p.o. Cardizem Eliquis is on hold   Possible dementia with delirium Intermittent agitation Start Seroquel nightly for delirium.  Ambulatory dysfunction PT assessment recommended SNF with 24 hours assistance/supervision Continue PT OT with assistance and fall precautions. TOC consulted to assist with SNF placement.   VTE Prophylaxis:  Eliquis.  Code Status: Full code  Family Communication: Updated her legal guardian  on 10/16/2019 via phone.  Disposition Plan:   Patient is from home  Anticipate discharge to SNF in 24 to 48 hours or when urology and IR sign off.  Barrier to DC, SNF placement.    Consultants:  Urology  Interventional radiology.  Procedures: Status post IR guided percutaneous nephrostomy tube placement on 10/15/19 by interventional radiology Dr. Loreta Ave.    Objective: Vitals:   10/16/19 1258 10/16/19 1936 10/17/19 0647 10/17/19 1405  BP: 130/84 132/74 135/83 124/75  Pulse: 77 90 70 77  Resp: 18 20 16 18   Temp: 98.7 F (37.1 C) 98.6 F (37 C) (!) 97.5 F (36.4 C) 98.1 F (36.7 C)  TempSrc: Oral Oral Oral Oral  SpO2: 99% 100% 100% 97%  Weight:        Intake/Output Summary (Last 24 hours) at 10/17/2019 1417 Last data filed at 10/17/2019 1300 Gross per 24 hour  Intake 1898.57 ml  Output 650 ml  Net 1248.57 ml   Filed Weights   10/14/19 2334  Weight: 71.1 kg    Exam:  . General: 74 y.o. year-old female pleasant well-developed well-nourished no acute distress.  Alert and oriented x3 this morning and answering questions appropriately.   . Cardiovascular: Regular rate and rhythm no rubs or gallops. 66 Respiratory: Clear to auscultation no wheezes or rales.  .  Abdomen: Soft nontender normal bowel sounds present  . musculoskeletal: Trace lower extremity edema bilaterally.  Chronic venous stasis hyperpigmentation noted in bilateral lower extremity.  Marland Kitchen  Psychiatry: Mood is appropriate for condition and setting.   Data Reviewed: CBC: Recent Labs  Lab 10/14/19 1837 10/15/19 0626 10/16/19 0600  WBC 4.7 5.7 7.2  NEUTROABS 3.2  --   --  HGB 12.1 10.1* 10.9*  HCT 37.8 32.6* 35.5*  MCV 94.0 95.6 97.3  PLT 265 208 210   Basic Metabolic Panel: Recent Labs  Lab 10/14/19 1837 10/15/19 0626 10/16/19 0600  NA 139 140 139  K 3.8 3.9 3.1*  CL 107 113* 112*  CO2 22 22 21*  GLUCOSE 98 103* 93  BUN 19 18 10   CREATININE 0.98 0.87 0.64  CALCIUM 8.7* 8.4* 8.4*   MG  --   --  2.0   GFR: CrCl cannot be calculated (Unknown ideal weight.). Liver Function Tests: Recent Labs  Lab 10/14/19 1837 10/16/19 0600  AST 20 21  ALT 12 13  ALKPHOS 84 67  BILITOT 0.9 1.5*  PROT 7.2 6.2*  ALBUMIN 3.5 3.0*   No results for input(s): LIPASE, AMYLASE in the last 168 hours. No results for input(s): AMMONIA in the last 168 hours. Coagulation Profile: Recent Labs  Lab 10/15/19 0906  INR 1.0   Cardiac Enzymes: No results for input(s): CKTOTAL, CKMB, CKMBINDEX, TROPONINI in the last 168 hours. BNP (last 3 results) No results for input(s): PROBNP in the last 8760 hours. HbA1C: No results for input(s): HGBA1C in the last 72 hours. CBG: No results for input(s): GLUCAP in the last 168 hours. Lipid Profile: No results for input(s): CHOL, HDL, LDLCALC, TRIG, CHOLHDL, LDLDIRECT in the last 72 hours. Thyroid Function Tests: No results for input(s): TSH, T4TOTAL, FREET4, T3FREE, THYROIDAB in the last 72 hours. Anemia Panel: No results for input(s): VITAMINB12, FOLATE, FERRITIN, TIBC, IRON, RETICCTPCT in the last 72 hours. Urine analysis:    Component Value Date/Time   COLORURINE YELLOW 10/14/2019 2105   APPEARANCEUR CLOUDY (A) 10/14/2019 2105   LABSPEC >1.046 (H) 10/14/2019 2105   PHURINE 7.0 10/14/2019 2105   GLUCOSEU NEGATIVE 10/14/2019 2105   HGBUR LARGE (A) 10/14/2019 2105   BILIRUBINUR NEGATIVE 10/14/2019 2105   KETONESUR NEGATIVE 10/14/2019 2105   PROTEINUR 100 (A) 10/14/2019 2105   UROBILINOGEN 2.0 (H) 10/14/2013 1209   NITRITE POSITIVE (A) 10/14/2019 2105   LEUKOCYTESUR LARGE (A) 10/14/2019 2105   Sepsis Labs: @LABRCNTIP (procalcitonin:4,lacticidven:4)  ) Recent Results (from the past 240 hour(s))  Respiratory Panel by RT PCR (Flu A&B, Covid) - Nasopharyngeal Swab     Status: None   Collection Time: 10/14/19  6:37 PM   Specimen: Nasopharyngeal Swab  Result Value Ref Range Status   SARS Coronavirus 2 by RT PCR NEGATIVE NEGATIVE Final     Comment: (NOTE) SARS-CoV-2 target nucleic acids are NOT DETECTED. The SARS-CoV-2 RNA is generally detectable in upper respiratoy specimens during the acute phase of infection. The lowest concentration of SARS-CoV-2 viral copies this assay can detect is 131 copies/mL. A negative result does not preclude SARS-Cov-2 infection and should not be used as the sole basis for treatment or other patient management decisions. A negative result may occur with  improper specimen collection/handling, submission of specimen other than nasopharyngeal swab, presence of viral mutation(s) within the areas targeted by this assay, and inadequate number of viral copies (<131 copies/mL). A negative result must be combined with clinical observations, patient history, and epidemiological information. The expected result is Negative. Fact Sheet for Patients:  Fact Sheet for Healthcare Providers:  12/14/19 This test is not yet ap proved or cleared by the https://www.moore.com/ FDA and  has been authorized for detection and/or diagnosis of SARS-CoV-2 by FDA under an Emergency Use Authorization (EUA). This EUA will remain  in effect (meaning this test can be used) for the duration  of the COVID-19 declaration under Section 564(b)(1) of the Act, 21 U.S.C. section 360bbb-3(b)(1), unless the authorization is terminated or revoked sooner.    Influenza A by PCR NEGATIVE NEGATIVE Final   Influenza B by PCR NEGATIVE NEGATIVE Final    Comment: (NOTE) The Xpert Xpress SARS-CoV-2/FLU/RSV assay is intended as an aid in  the diagnosis of influenza from Nasopharyngeal swab specimens and  should not be used as a sole basis for treatment. Nasal washings and  aspirates are unacceptable for Xpert Xpress SARS-CoV-2/FLU/RSV  testing. Fact Sheet for Patients: https://www.moore.com/ Fact Sheet for Healthcare  Providers: https://www.young.biz/ This test is not yet approved or cleared by the [place] FDA and  has been authorized for detection and/or diagnosis of SARS-CoV-2 by  FDA under an Emergency Use Authorization (EUA). This EUA will remain  in effect (meaning this test can be used) for the duration of the  Covid-19 declaration under Section 564(b)(1) of the Act, 21  U.S.C. section 360bbb-3(b)(1), unless the authorization is  terminated or revoked. Performed at [facility], [address] [place], [place] [address]   Urine Culture     Status: Abnormal   Collection Time: 10/14/19  9:05 PM   Specimen: Urine, Clean Catch  Result Value Ref Range Status   Specimen Description   Final    URINE, CLEAN CATCH Performed at [facility], [address] [place], [place] [address]    Special Requests   Final    NONE Performed at [facility], [address] [place], [place] [address]    Culture MULTIPLE SPECIES PRESENT, SUGGEST RECOLLECTION (A)  Final   Report Status 10/15/2019 FINAL  Final  Aerobic/Anaerobic Culture (surgical/deep wound)     Status: None (Preliminary result)   Collection Time: 10/15/19 11:33 AM   Specimen: Kidney; Abscess  Result Value Ref Range Status   Specimen Description   Final    KIDNEY Performed at [facility], [address] [place], [place] [address]    Special Requests   Final    RIGHT Performed at [facility], [address], [place], [place] [address]    Gram Stain   Final    ABUNDANT WBC PRESENT, PREDOMINANTLY PMN ABUNDANT GRAM NEGATIVE RODS Performed at [facility], [address], [place], [place] [address]    Culture   Final    ABUNDANT ESCHERICHIA COLI NO ANAEROBES ISOLATED; CULTURE IN PROGRESS FOR 5 DAYS    Report Status PENDING  Incomplete   Organism ID, Bacteria ESCHERICHIA COLI  Final      Susceptibility    Escherichia coli - MIC*    AMPICILLIN <=2 SENSITIVE Sensitive     CEFAZOLIN <=4 SENSITIVE Sensitive     CEFEPIME <=0.12 SENSITIVE Sensitive     CEFTAZIDIME <=1 SENSITIVE Sensitive     CEFTRIAXONE <=0.25 SENSITIVE Sensitive     CIPROFLOXACIN <=0.25 SENSITIVE Sensitive     GENTAMICIN <=1 SENSITIVE Sensitive     IMIPENEM <=0.25 SENSITIVE Sensitive     TRIMETH/SULFA <=20 SENSITIVE Sensitive     AMPICILLIN/SULBACTAM <=2 SENSITIVE Sensitive     PIP/TAZO <=4 SENSITIVE Sensitive     * ABUNDANT ESCHERICHIA COLI      Studies: No results found.  Scheduled Meds: . apixaban  5 mg Oral BID  . diltiazem  240 mg Oral Daily    Continuous Infusions: . sodium chloride 90 mL/hr at 10/16/19 0954  . cefTRIAXone (ROCEPHIN)  IV 1 g (10/16/19 2130)  LOS: 3 days     Kayleen Memos, MD [facility] Pager [phone]  If 7PM-7AM, please contact night-coverage [email or web address] Password [facility] 10/17/2019, 2:17 PM

## 2019-10-18 LAB — COMPREHENSIVE METABOLIC PANEL
ALT: 14 U/L (ref 0–44)
AST: 16 U/L (ref 15–41)
Albumin: 3.2 g/dL — ABNORMAL LOW (ref 3.5–5.0)
Alkaline Phosphatase: 74 U/L (ref 38–126)
Anion gap: 8 (ref 5–15)
BUN: 11 mg/dL (ref 8–23)
CO2: 21 mmol/L — ABNORMAL LOW (ref 22–32)
Calcium: 8.5 mg/dL — ABNORMAL LOW (ref 8.9–10.3)
Chloride: 108 mmol/L (ref 98–111)
Creatinine, Ser: 0.72 mg/dL (ref 0.44–1.00)
GFR calc Af Amer: >60 mL/min (ref >60)
GFR calc non Af Amer: >60 mL/min (ref >60)
Glucose, Bld: 94 mg/dL (ref 70–99)
Potassium: 3.4 mmol/L — ABNORMAL LOW (ref 3.5–5.1)
Sodium: 137 mmol/L (ref 135–145)
Total Bilirubin: 0.9 mg/dL (ref 0.3–1.2)
Total Protein: 6.7 g/dL (ref 6.5–8.1)

## 2019-10-18 MED ORDER — POTASSIUM CHLORIDE 10 MEQ/100ML IV SOLN: 10.0000 meq | INTRAVENOUS | Status: DC

## 2019-10-18 MED ORDER — CEPHALEXIN 500 MG PO CAPS
500.0000 mg | ORAL_CAPSULE | Freq: Three times a day (TID) | ORAL | Status: DC
  Administered 2019-10-18 – 2019-10-19 (×3): 500 mg via ORAL
  Filled 2019-10-18 (×3): qty 1

## 2019-10-18 MED ORDER — POTASSIUM CHLORIDE CRYS ER 20 MEQ PO TBCR
20.0000 meq | EXTENDED_RELEASE_TABLET | Freq: Every day | ORAL | Status: DC
  Administered 2019-10-18 – 2019-10-20 (×3): 20 meq via ORAL
  Filled 2019-10-18 (×3): qty 1

## 2019-10-18 MED ORDER — POTASSIUM CHLORIDE CRYS ER 20 MEQ PO TBCR
40.0000 meq | EXTENDED_RELEASE_TABLET | Freq: Once | ORAL | Status: AC
  Administered 2019-10-18: 40 meq via ORAL
  Filled 2019-10-18: qty 2

## 2019-10-18 NOTE — Progress Notes (Signed)
PROGRESS NOTE  [name] ACZ:[id number] DOB: [birth year] DOA: 10/14/2019 PCP: Glenis Smoker, MD  HPI/Recap of past 24 hours: [name] a 74 y.o.femalewith medical history significant ofA.Fib on eliquis and cardizem, presented to the urology office for follow-up of of UTI treatment.  She did have a CT scan which showed stones in the right and left kidney and gas in the collecting system on the right side so urology seen the patient for admission to the hospital.  In the ED patient had a normal creatinine.  WBC is 4.7.  There was no evidence of sepsis.  Post nephrostomy tube placement on 10/15/2019 by interventional radiology Dr. Earleen Newport.  She has intermittent agitation.    10/18/19: Seen and examined.  No acute events overnight.  She is alert and pleasantly demented in a setting of dementia.  Seen by urology, can switch to oral Keflex x 14 days.   Assessment/Plan: Principal Problem:   Emphysematous pyelitis Active Problems:   Atrial fibrillation (HCC)  Emphysematous pyelitis/XGP kidney status post right-sided percutaneous nephrostomy 10/15/19 by IR DR. Wagner. Deep wound culture grew abundant E. coli, pansensitive. Completed 3 days of Rocephin.  Switch to Keflex on 10/18/2019 x 14 days Appreciate assistance from interventional radiology and urology. Continue gentle IV fluid hydration   Refractory hypokalemia Potassium 3.1 >> 3.4 Repleted with IV potassium supplement 10 mEq x 4 Magnesium 2.0.  Paroxysmal A. fib Rate is well controlled on p.o. Cardizem On Eliquis for primary CVA prevention.  Possible dementia with delirium Intermittent agitation Continue Seroquel 25 mg nightly for delirium.  Ambulatory dysfunction PT assessment recommended SNF with 24 hours assistance/supervision Continue PT OT with assistance and fall precautions. TOC consulted to assist with SNF placement.   VTE Prophylaxis:  Eliquis.  Code Status: Full code  Family Communication:  Updated her legal guardian on 10/16/2019 via phone.  Disposition Plan:   Patient is from home Anticipate discharge to SNF in the next 24 hours.  Barrier to discharge: SNF placement.     Consultants:  Urology  Interventional radiology.  Procedures: Status post IR guided percutaneous nephrostomy tube placement on 10/15/19 by interventional radiology Dr. Earleen Newport.    Objective: Vitals:   10/17/19 1405 10/17/19 1441 10/17/19 2045 10/18/19 0530  BP: 124/75 130/77 136/82 (!) 150/86  Pulse: 77 71 80 79  Resp: 18 18 20 20   Temp: 98.1 F (36.7 C) 97.8 F (36.6 C) 98.7 F (37.1 C) 97.9 F (36.6 C)  TempSrc: Oral Oral Oral Oral  SpO2: 97% 98% 98% 98%  Weight:        Intake/Output Summary (Last 24 hours) at 10/18/2019 1336 Last data filed at 10/18/2019 1303 Gross per 24 hour  Intake 240 ml  Output 2550 ml  Net -2310 ml   Filed Weights   10/14/19 2334  Weight: 71.1 kg    Exam:  . General: 74 y.o. year-old female well-developed well-nourished pleasantly demented.   . Cardiovascular: Regular rate and rhythm no rubs or gallops.   Marland Kitchen Respiratory: Clear to Auscultation No Wheezes or Rales.  .  Abdomen: Soft nontender normal bowel sounds present.  Right nephrostomy tube in place.  Minimal output noted.   . Musculoskeletal: Trace lower extremity edema bilaterally.  Chronic venous stasis hyperpigmentation noted in bilateral lower extremity.  Marland Kitchen Psychiatry: Mood is appropriate for condition and setting.  Data Reviewed: CBC: Recent Labs  Lab 10/14/19 1837 10/15/19 0626 10/16/19 0600  WBC 4.7 5.7 7.2  NEUTROABS 3.2  --   --  HGB 12.1 10.1* 10.9*  HCT 37.8 32.6* 35.5*  MCV 94.0 95.6 97.3  PLT 265 208 210   Basic Metabolic Panel: Recent Labs  Lab 10/14/19 1837 10/15/19 0626 10/16/19 0600 10/17/19 1439 10/18/19 0558  NA 139 140 139  --  137  K 3.8 3.9 3.1* 3.9 3.4*  CL 107 113* 112*  --  108  CO2 22 22 21*  --  21*  GLUCOSE 98 103* 93  --  94  BUN 19 18 10   --  11   CREATININE 0.98 0.87 0.64  --  0.72  CALCIUM 8.7* 8.4* 8.4*  --  8.5*  MG  --   --  2.0  --   --    GFR: CrCl cannot be calculated (Unknown ideal weight.). Liver Function Tests: Recent Labs  Lab 10/14/19 1837 10/16/19 0600 10/18/19 0558  AST 20 21 16   ALT 12 13 14   ALKPHOS 84 67 74  BILITOT 0.9 1.5* 0.9  PROT 7.2 6.2* 6.7  ALBUMIN 3.5 3.0* 3.2*   No results for input(s): LIPASE, AMYLASE in the last 168 hours. No results for input(s): AMMONIA in the last 168 hours. Coagulation Profile: Recent Labs  Lab 10/15/19 0906  INR 1.0   Cardiac Enzymes: No results for input(s): CKTOTAL, CKMB, CKMBINDEX, TROPONINI in the last 168 hours. BNP (last 3 results) No results for input(s): PROBNP in the last 8760 hours. HbA1C: No results for input(s): HGBA1C in the last 72 hours. CBG: No results for input(s): GLUCAP in the last 168 hours. Lipid Profile: No results for input(s): CHOL, HDL, LDLCALC, TRIG, CHOLHDL, LDLDIRECT in the last 72 hours. Thyroid Function Tests: No results for input(s): TSH, T4TOTAL, FREET4, T3FREE, THYROIDAB in the last 72 hours. Anemia Panel: No results for input(s): VITAMINB12, FOLATE, FERRITIN, TIBC, IRON, RETICCTPCT in the last 72 hours. Urine analysis:    Component Value Date/Time   COLORURINE YELLOW 10/14/2019 2105   APPEARANCEUR CLOUDY (A) 10/14/2019 2105   LABSPEC >1.046 (H) 10/14/2019 2105   PHURINE 7.0 10/14/2019 2105   GLUCOSEU NEGATIVE 10/14/2019 2105   HGBUR LARGE (A) 10/14/2019 2105   BILIRUBINUR NEGATIVE 10/14/2019 2105   KETONESUR NEGATIVE 10/14/2019 2105   PROTEINUR 100 (A) 10/14/2019 2105   UROBILINOGEN 2.0 (H) 10/14/2013 1209   NITRITE POSITIVE (A) 10/14/2019 2105   LEUKOCYTESUR LARGE (A) 10/14/2019 2105   Sepsis Labs: @LABRCNTIP (procalcitonin:4,lacticidven:4)  ) Recent Results (from the past 240 hour(s))  Respiratory Panel by RT PCR (Flu A&B, Covid) - Nasopharyngeal Swab     Status: None   Collection Time: 10/14/19  6:37 PM    Specimen: Nasopharyngeal Swab  Result Value Ref Range Status   SARS Coronavirus 2 by RT PCR NEGATIVE NEGATIVE Final    Comment: (NOTE) SARS-CoV-2 target nucleic acids are NOT DETECTED. The SARS-CoV-2 RNA is generally detectable in upper respiratoy specimens during the acute phase of infection. The lowest concentration of SARS-CoV-2 viral copies this assay can detect is 131 copies/mL. A negative result does not preclude SARS-Cov-2 infection and should not be used as the sole basis for treatment or other patient management decisions. A negative result may occur with  improper specimen collection/handling, submission of specimen other than nasopharyngeal swab, presence of viral mutation(s) within the areas targeted by this assay, and inadequate number of viral copies (<131 copies/mL). A negative result must be combined with clinical observations, patient history, and epidemiological information. The expected result is Negative. Fact Sheet for Patients:  12/14/2019 Fact Sheet for Healthcare Providers:  2106 This  test is not yet ap proved or cleared by the [place] and  has been authorized for detection and/or diagnosis of SARS-CoV-2 by FDA under an Emergency Use Authorization (EUA). This EUA will remain  in effect (meaning this test can be used) for the duration of the COVID-19 declaration under Section 564(b)(1) of the Act, 21 U.S.C. section 360bbb-3(b)(1), unless the authorization is terminated or revoked sooner.    Influenza A by PCR NEGATIVE NEGATIVE Final   Influenza B by PCR NEGATIVE NEGATIVE Final    Comment: (NOTE) The Xpert Xpress SARS-CoV-2/FLU/RSV assay is intended as an aid in  the diagnosis of influenza from Nasopharyngeal swab specimens and  should not be used as a sole basis for treatment. Nasal washings and  aspirates are unacceptable for Xpert Xpress SARS-CoV-2/FLU/RSV  testing. Fact Sheet  for Patients: https://www.moore.com/ Fact Sheet for Healthcare Providers: https://www.young.biz/ This test is not yet approved or cleared by the [place] FDA and  has been authorized for detection and/or diagnosis of SARS-CoV-2 by  FDA under an Emergency Use Authorization (EUA). This EUA will remain  in effect (meaning this test can be used) for the duration of the  Covid-19 declaration under Section 564(b)(1) of the Act, 21  U.S.C. section 360bbb-3(b)(1), unless the authorization is  terminated or revoked. Performed at [facility], [address], [place], [place] [address]   Urine Culture     Status: Abnormal   Collection Time: 10/14/19  9:05 PM   Specimen: Urine, Clean Catch  Result Value Ref Range Status   Specimen Description   Final    URINE, CLEAN CATCH Performed at [facility], [address], [place], [place] [address]    Special Requests   Final    NONE Performed at [facility], [address] [place], [place] [address]    Culture MULTIPLE SPECIES PRESENT, SUGGEST RECOLLECTION (A)  Final   Report Status 10/15/2019 FINAL  Final  Aerobic/Anaerobic Culture (surgical/deep wound)     Status: None (Preliminary result)   Collection Time: 10/15/19 11:33 AM   Specimen: Kidney; Abscess  Result Value Ref Range Status   Specimen Description   Final    KIDNEY Performed at [facility], [address], [place], [place] [address]    Special Requests   Final    RIGHT Performed at [facility], [address] [place], [place] [address]    Gram Stain   Final    ABUNDANT WBC PRESENT, PREDOMINANTLY PMN ABUNDANT GRAM NEGATIVE RODS Performed at [facility], [address] [place], [place] [address]    Culture   Final    ABUNDANT ESCHERICHIA COLI NO ANAEROBES ISOLATED; CULTURE IN PROGRESS FOR 5 DAYS    Report Status PENDING   Incomplete   Organism ID, Bacteria ESCHERICHIA COLI  Final      Susceptibility   Escherichia coli - MIC*    AMPICILLIN <=2 SENSITIVE Sensitive     CEFAZOLIN <=4 SENSITIVE Sensitive     CEFEPIME <=0.12 SENSITIVE Sensitive     CEFTAZIDIME <=1 SENSITIVE Sensitive     CEFTRIAXONE <=0.25 SENSITIVE Sensitive     CIPROFLOXACIN <=0.25 SENSITIVE Sensitive     GENTAMICIN <=1 SENSITIVE Sensitive     IMIPENEM <=0.25 SENSITIVE Sensitive     TRIMETH/SULFA <=20 SENSITIVE Sensitive     AMPICILLIN/SULBACTAM <=2 SENSITIVE Sensitive     PIP/TAZO <=4 SENSITIVE Sensitive     * ABUNDANT ESCHERICHIA COLI  Studies: No results found.  Scheduled Meds: . apixaban  5 mg Oral BID  . cephALEXin  500 mg Oral Q8H  . diltiazem  240 mg Oral Daily  . QUEtiapine  25 mg Oral QHS    Continuous Infusions: . sodium chloride 90 mL/hr at 10/18/19 0035     LOS: 4 days     Darlin Drop, MD [facility] Pager [phone]  If 7PM-7AM, please contact night-coverage [email or web address] Password [facility] 10/18/2019, 1:36 PM

## 2019-10-18 NOTE — TOC Progression Note (Signed)
Transition of Care [facility]) - Progression Note    Patient Details  Name: [name] MRN: [id number] Date of Birth: 07/14/1946  [facility] CM/SW Contact  Ida [place], [place] Phone Number: 10/18/2019, 12:08 PM  Clinical Narrative:  Spoke with patient in follow up to PT recommendation of SNF.  She is open to going "because I have been worried about how I am doing-like my memory and confusion. And I am not feeling 100%."  Here main concern is the care of her cat while she is gone.  I called Ms Morey [facility] POA, who confirms that cat is being cared for my neighbor Corrie Dandy, and that they support the decision of SNF.  Bed search initiated. TOC will continue to follow during the course of hospitalization.      Expected Discharge Plan: Skilled Nursing Facility Barriers to Discharge: SNF Pending bed offer  Expected Discharge Plan and Services Expected Discharge Plan: Skilled Nursing Facility   Discharge Planning Services: CM Consult   Living arrangements for the past 2 months: Apartment                                       Social Determinants of Health (SDOH) Interventions    Readmission Risk Interventions No flowsheet data found.

## 2019-10-18 NOTE — NC FL2 (Signed)
[place] LEVEL OF CARE SCREENING TOOL     IDENTIFICATION  Patient Name: [name] Birthdate: [birth year] Sex: female Admission Date (Current Location): 10/14/2019  [facility] and [place] Number:  Herbalist and Address:  [facility],  [address] [place], [place]      Provider Number: [id number]  Attending Physician Name and Address:  Kayleen Memos, DO  Relative Name and Phone Number:       Current Level of Care: Hospital Recommended Level of Care: [place] Prior Approval Number:    Date Approved/Denied:   PASRR Number: [id number]  Discharge Plan: SNF    Current Diagnoses: Patient Active Problem List   Diagnosis Date Noted  . 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 Neuropathy 04/13/2019  . Faintness 05/30/2015  . Chronic anticoagulation 05/16/2014  . Hematuria, gross 10/14/2013  . UTI (urinary tract infection) 10/14/2013  . Generalized weakness 08/23/2013  . Fracture of multiple ribs 08/23/2013  . PAF (paroxysmal atrial fibrillation) ([place]) 08/16/2013  . C3 cervical fracture ([place]) 08/04/2013  . 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  . Sternal fracture 08/04/2013  . Atrial fibrillation ([place]) 08/04/2013  . Acute blood loss anemia 08/04/2013  . Acute respiratory failure ([place]) 08/04/2013  . Lower extremity neuropathy 08/04/2013  . Traumatic right hemopneumothorax 08/04/2013  . Thrombocytopenia ([place]) 08/04/2013    Orientation RESPIRATION BLADDER Height & Weight     Self, Situation, Place  Normal Indwelling catheter Weight: 71.1 kg Height:     BEHAVIORAL SYMPTOMS/MOOD NEUROLOGICAL BOWEL NUTRITION STATUS  (none) (none) Continent Diet(see d/c summary)  AMBULATORY STATUS COMMUNICATION OF NEEDS Skin   Extensive Assist Verbally Normal                       Personal Care Assistance Level of Assistance  Bathing, Feeding, Dressing Bathing Assistance: Maximum assistance Feeding assistance:  Independent Dressing Assistance: Limited assistance     Functional Limitations Info  Sight, Hearing, Speech Sight Info: Adequate Hearing Info: Adequate Speech Info: Adequate    SPECIAL CARE FACTORS FREQUENCY  PT (By licensed PT), OT (By licensed OT)     PT Frequency: 5X/W OT Frequency: 5X/W            Contractures Contractures Info: Not present    Additional Factors Info  Code Status, Allergies Code Status Info: full Allergies Info: penicillins           Current Medications (10/18/2019):  This is the current hospital active medication list Current Facility-Administered Medications  Medication Dose Route Frequency Provider Last Rate Last Admin  . 0.9 %  sodium chloride infusion   Intravenous Continuous Etta Quill, DO 90 mL/hr at 10/18/19 0035 New Bag at 10/18/19 0035  . acetaminophen (TYLENOL) tablet 650 mg  650 mg Oral Q6H PRN Etta Quill, DO   650 mg at 10/18/19 8416   Or  . acetaminophen (TYLENOL) suppository 650 mg  650 mg Rectal Q6H PRN Etta Quill, DO      . apixaban Arne Cleveland) tablet 5 mg  5 mg Oral BID Leodis Sias T, RPH   5 mg at 10/18/19 1023  . cefTRIAXone (ROCEPHIN) 1 g in sodium chloride 0.9 % 100 mL IVPB  1 g Intravenous Q24H Jennette Kettle M, DO 200 mL/hr at 10/17/19 2144 1 g at 10/17/19 2144  . diltiazem (CARDIZEM CD) 24 hr capsule 240 mg  240 mg Oral Daily Jennette Kettle M, DO   240 mg at 10/18/19  1023  . ondansetron (ZOFRAN) tablet 4 mg  4 mg Oral Q6H PRN Hillary Bow, DO       Or  . ondansetron [facility]) injection 4 mg  4 mg Intravenous Q6H PRN Hillary Bow, DO   4 mg at 10/18/19 0031  . QUEtiapine (SEROQUEL) tablet 25 mg  25 mg Oral QHS Dow Adolph N, DO   25 mg at 10/17/19 2142     Discharge Medications: Please see discharge summary for a list of discharge medications.  Relevant Imaging Results:  Relevant Lab Results:   Additional Information [phone] [place], [place]

## 2019-10-18 NOTE — Care Management Important Message (Signed)
Important Message  Patient Details IM Letter given to Daryel Gerald SW Case Manager to present to the Patient Name: Rashada Klontz MRN: [id number] Date of Birth: Dec 15, 1945   Medicare Important Message Given:  Yes     Caren Macadam 10/18/2019, 10:32 AM

## 2019-10-18 NOTE — Progress Notes (Signed)
Referring Physician(s): Pace,M  Supervising Physician: Ruel Favors  Patient Status:  [facility] - In-pt  Chief Complaint: Flank pain, right emphysematous pyelitis   Subjective: Patient doing fairly well this afternoon; denies worsening flank pain, nausea, vomiting   Allergies: Penicillins  Medications: Prior to Admission medications   Medication Sig Start Date End Date Taking? Authorizing Provider  acetaminophen (TYLENOL 8 HOUR ARTHRITIS PAIN) 650 MG CR tablet Take 650 mg by mouth every 8 (eight) hours as needed for pain.   Yes [provider]  diltiazem (CARDIZEM CD) 240 MG 24 hr capsule TAKE 1 CAPSULE BY MOUTH DAILY. MUST KEEP UPCOMING APPT IN APRIL TO GET FURTHER REFILLS Patient taking differently: Take 240 mg by mouth daily.  12/08/18  Yes Skains, Veverly Fells, MD  ELIQUIS 5 MG TABS tablet TAKE 1 TABLET BY MOUTH TWICE A DAY Patient taking differently: Take 5 mg by mouth 2 (two) times daily.  06/16/19  Yes Jake Bathe, MD     Vital Signs: BP 117/71 (BP Location: Left Arm)   Pulse 72   Temp 98.5 F (36.9 C) (Oral)   Resp 20   Wt 156 lb 12 oz (71.1 kg)   SpO2 99%   BMI 24.55 kg/m   Physical Exam awake, alert.  Right nephrostomy intact, insertion site okay, minimal tenderness, output approximately 2 L of blood-tinged urine; nephrostomy flushed without difficulty  Imaging: IR NEPHROSTOMY PLACEMENT RIGHT  Result Date: 10/15/2019 INDICATION: 74 year old female with a history of emphysematous pyelitis EXAM: IR NEPHROSTOMY PLACEMENT RIGHT COMPARISON:  None. MEDICATIONS: 1 g Rocephin; The antibiotic was administered in an appropriate time frame prior to skin puncture. ANESTHESIA/SEDATION: Fentanyl 150 mcg IV; Versed 4.0 mg IV Moderate Sedation Time:  13 minutes The patient was continuously monitored during the procedure by the interventional radiology nurse under my direct supervision. CONTRAST:  86mL OMNIPAQUE IOHEXOL 300 MG/ML SOLN - administered into the collecting  system(s) FLUOROSCOPY TIME:  Fluoroscopy Time: 1 minutes 6 seconds (18 mGy). COMPLICATIONS: None PROCEDURE: Informed written consent was obtained from the patient after a thorough discussion of the procedural risks, benefits and alternatives. All questions were addressed. Maximal Sterile Barrier Technique was utilized including caps, mask, sterile gowns, sterile gloves, sterile drape, hand hygiene and skin antiseptic. A timeout was performed prior to the initiation of the procedure. Patient positioned prone position on the fluoroscopy table. Ultrasound survey of the right flank was performed with images stored and sent to PACs. The patient was then prepped and draped in the usual sterile fashion. 1% lidocaine was used to anesthetize the skin and subcutaneous tissues for local anesthesia. A Chiba needle was then used to access a posterior inferior calyx with ultrasound guidance. With spontaneous urine returned through the needle, passage of an 018 micro wire into the collecting system was performed under fluoroscopy. A small incision was made with an 11 blade scalpel, and the needle was removed from the wire. An Accustick system was then advanced over the wire into the collecting system under fluoroscopy. The metal stiffener and inner dilator were removed, and then a sample of fluid was aspirated through the 4 French outer sheath. Bentson wire was passed into the collecting system and the sheath removed. Ten French dilation of the soft tissues was performed. Using modified Seldinger technique, a 10 French pigtail catheter drain was placed over the Bentson wire. Wire and inner stiffener removed, and the pigtail was formed in the collecting system. Small amount of contrast confirmed position of the catheter. Sample was  sent for culture. Patient tolerated the procedure well and remained hemodynamically stable throughout. No complications were encountered and no significant blood loss encountered IMPRESSION: Status post  right-sided percutaneous nephrostomy. Signed, Dulcy Fanny. Dellia Nims, RPVI [facility] Radiology Electronically Signed   By: Corrie Mckusick D.O.   On: 10/15/2019 12:49    Labs:  CBC: Recent Labs    01/15/19 1332 10/14/19 1837 10/15/19 0626 10/16/19 0600  WBC 5.6 4.7 5.7 7.2  HGB 12.9 12.1 10.1* 10.9*  HCT 40.0 37.8 32.6* 35.5*  PLT 284 265 208 210    COAGS: Recent Labs    01/01/19 1029 01/08/19 1031 01/15/19 1315 10/15/19 0906  INR 1.0* 1.2* 3.6* 1.0    BMP: Recent Labs    10/14/19 1837 10/14/19 1837 10/15/19 0626 10/16/19 0600 10/17/19 1439 10/18/19 0558  NA 139  --  140 139  --  137  K 3.8   < > 3.9 3.1* 3.9 3.4*  CL 107  --  113* 112*  --  108  CO2 22  --  22 21*  --  21*  GLUCOSE 98  --  103* 93  --  94  BUN 19  --  18 10  --  11  CALCIUM 8.7*  --  8.4* 8.4*  --  8.5*  CREATININE 0.98  --  0.87 0.64  --  0.72  GFRNONAA 57*  --  >60 >60  --  >60  GFRAA >60  --  >60 >60  --  >60   < > = values in this interval not displayed.    LIVER FUNCTION TESTS: Recent Labs    10/14/19 1837 10/16/19 0600 10/18/19 0558  BILITOT 0.9 1.5* 0.9  AST 20 21 16   ALT 12 13 14   ALKPHOS 84 67 74  PROT 7.2 6.2* 6.7  ALBUMIN 3.5 3.0* 3.2*    Assessment and Plan: Patient with history of right emphysematous pyelitis; status post right nephrostomy on 3/5; afebrile, creatinine normal, cultures with E. Coli; further plans as outlined by urology   Electronically Signed: D. Rowe Robert, PA-C 10/18/2019, 5:21 PM   I spent a total of 15 minutes at the the patient's bedside AND on the patient's hospital floor or unit, greater than 50% of which was counseling/coordinating care for right percutaneous nephrostomy    Patient ID: [name], female   DOB: [birth year], 74 y.o.   MRN: [id number]

## 2019-10-18 NOTE — Progress Notes (Signed)
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  Patient is without complaint.  Patient is sleepy during encounter.  No pain.  No fevers, chills, nausea or vomiting.  She has a 1:1 Comptroller.   Principal Problem:   Emphysematous pyelitis Active Problems:   Atrial fibrillation (HCC)  Current Facility-Administered Medications  Medication Dose Route Frequency Provider Last Rate Last Admin  . 0.9 %  sodium chloride infusion   Intravenous Continuous Hillary Bow, DO 90 mL/hr at 10/18/19 0035 New Bag at 10/18/19 0035  . acetaminophen (TYLENOL) tablet 650 mg  650 mg Oral Q6H PRN Hillary Bow, DO   650 mg at 10/18/19 7846   Or  . acetaminophen (TYLENOL) suppository 650 mg  650 mg Rectal Q6H PRN Hillary Bow, DO      . apixaban Everlene Balls) tablet 5 mg  5 mg Oral BID Herby Abraham, RPH   5 mg at 10/17/19 2145  . cefTRIAXone (ROCEPHIN) 1 g in sodium chloride 0.9 % 100 mL IVPB  1 g Intravenous Q24H Lyda Perone M, DO 200 mL/hr at 10/17/19 2144 1 g at 10/17/19 2144  . diltiazem (CARDIZEM CD) 24 hr capsule 240 mg  240 mg Oral Daily Lyda Perone M, DO   240 mg at 10/17/19 1027  . ondansetron (ZOFRAN) tablet 4 mg  4 mg Oral Q6H PRN Hillary Bow, DO       Or  . ondansetron [facility]) injection 4 mg  4 mg Intravenous Q6H PRN Hillary Bow, DO   4 mg at 10/18/19 0031  . QUEtiapine (SEROQUEL) tablet 25 mg  25 mg Oral QHS Dow Adolph N, DO   25 mg at 10/17/19 2142     Objective: Vital: Vitals:   10/17/19 1405 10/17/19 1441 10/17/19 2045 10/18/19 0530  BP: 124/75 130/77 136/82 (!) 150/86  Pulse: 77 71 80 79  Resp: 18 18 20 20   Temp: 98.1 F (36.7 C) 97.8 F (36.6 C) 98.7 F (37.1 C) 97.9 F (36.6 C)  TempSrc: Oral Oral Oral Oral  SpO2: 97% 98% 98% 98%  Weight:       I/Os: I/O last 3 completed shifts: In: 360 [P.O.:360] Out: 925 [Urine:925]  Physical Exam:  General: Patient is  in no apparent distress Lungs: Normal respiratory effort, chest expands symmetrically. GI: The abdomen is soft and nontender without mass. GU: RIGHT PCN tube draining pink tinged clear urine, no clots; right flank non tender Ext: lower extremities symmetric, 3+edema b/l to knees  Lab Results: Recent Labs    10/16/19 0600  WBC 7.2  HGB 10.9*  HCT 35.5*   Recent Labs    10/16/19 0600 10/17/19 1439 10/18/19 0558  NA 139  --  137  K 3.1* 3.9 3.4*  CL 112*  --  108  CO2 21*  --  21*  GLUCOSE 93  --  94  BUN 10  --  11  CREATININE 0.64  --  0.72  CALCIUM 8.4*  --  8.5*   Recent Labs    10/15/19 0906  INR 1.0   No results for input(s): LABURIN in the last 72 hours. Results for orders placed or performed during the hospital encounter of 10/14/19  Respiratory Panel by RT PCR (Flu A&B, Covid) - Nasopharyngeal Swab     Status: None   Collection Time: 10/14/19  6:37 PM   Specimen: Nasopharyngeal Swab  Result Value Ref Range Status  SARS Coronavirus 2 by RT PCR NEGATIVE NEGATIVE Final    Comment: (NOTE) SARS-CoV-2 target nucleic acids are NOT DETECTED. The SARS-CoV-2 RNA is generally detectable in upper respiratoy specimens during the acute phase of infection. The lowest concentration of SARS-CoV-2 viral copies this assay can detect is 131 copies/mL. A negative result does not preclude SARS-Cov-2 infection and should not be used as the sole basis for treatment or other patient management decisions. A negative result may occur with  improper specimen collection/handling, submission of specimen other than nasopharyngeal swab, presence of viral mutation(s) within the areas targeted by this assay, and inadequate number of viral copies (<131 copies/mL). A negative result must be combined with clinical observations, patient history, and epidemiological information. The expected result is Negative. Fact Sheet for Patients:  https://www.moore.com/ Fact Sheet for  Healthcare Providers:  https://www.young.biz/ This test is not yet ap proved or cleared by the [place] FDA and  has been authorized for detection and/or diagnosis of SARS-CoV-2 by FDA under an Emergency Use Authorization (EUA). This EUA will remain  in effect (meaning this test can be used) for the duration of the COVID-19 declaration under Section 564(b)(1) of the Act, 21 U.S.C. section 360bbb-3(b)(1), unless the authorization is terminated or revoked sooner.    Influenza A by PCR NEGATIVE NEGATIVE Final   Influenza B by PCR NEGATIVE NEGATIVE Final    Comment: (NOTE) The Xpert Xpress SARS-CoV-2/FLU/RSV assay is intended as an aid in  the diagnosis of influenza from Nasopharyngeal swab specimens and  should not be used as a sole basis for treatment. Nasal washings and  aspirates are unacceptable for Xpert Xpress SARS-CoV-2/FLU/RSV  testing. Fact Sheet for Patients: https://www.moore.com/ Fact Sheet for Healthcare Providers: https://www.young.biz/ This test is not yet approved or cleared by the [place] FDA and  has been authorized for detection and/or diagnosis of SARS-CoV-2 by  FDA under an Emergency Use Authorization (EUA). This EUA will remain  in effect (meaning this test can be used) for the duration of the  Covid-19 declaration under Section 564(b)(1) of the Act, 21  U.S.C. section 360bbb-3(b)(1), unless the authorization is  terminated or revoked. Performed at [facility], [address], [place], [place] [address]   Urine Culture     Status: Abnormal   Collection Time: 10/14/19  9:05 PM   Specimen: Urine, Clean Catch  Result Value Ref Range Status   Specimen Description   Final    URINE, CLEAN CATCH Performed at [facility], [address], [place], [place] [address]    Special Requests   Final    NONE Performed at [facility], [address] [place], [place] [address]    Culture MULTIPLE SPECIES PRESENT, SUGGEST RECOLLECTION (A)  Final   Report Status 10/15/2019 FINAL  Final  Aerobic/Anaerobic Culture (surgical/deep wound)     Status: None (Preliminary result)   Collection Time: 10/15/19 11:33 AM   Specimen: Kidney; Abscess  Result Value Ref Range Status   Specimen Description   Final    KIDNEY Performed at [facility], [address] [place], [place] [address]    Special Requests   Final    RIGHT Performed at [facility], [address], [place], [place] [address]    Gram Stain   Final    ABUNDANT WBC PRESENT, PREDOMINANTLY PMN ABUNDANT GRAM NEGATIVE RODS Performed at [facility], [address] [place], [place] [address]    Culture  Final    ABUNDANT ESCHERICHIA COLI NO ANAEROBES ISOLATED; CULTURE IN PROGRESS FOR 5 DAYS    Report Status PENDING  Incomplete   Organism ID, Bacteria ESCHERICHIA COLI  Final      Susceptibility   Escherichia coli - MIC*    AMPICILLIN <=2 SENSITIVE Sensitive     CEFAZOLIN <=4 SENSITIVE Sensitive     CEFEPIME <=0.12 SENSITIVE Sensitive     CEFTAZIDIME <=1 SENSITIVE Sensitive     CEFTRIAXONE <=0.25 SENSITIVE Sensitive     CIPROFLOXACIN <=0.25 SENSITIVE Sensitive     GENTAMICIN <=1 SENSITIVE Sensitive     IMIPENEM <=0.25 SENSITIVE Sensitive     TRIMETH/SULFA <=20 SENSITIVE Sensitive     AMPICILLIN/SULBACTAM <=2 SENSITIVE Sensitive     PIP/TAZO <=4 SENSITIVE Sensitive     * ABUNDANT ESCHERICHIA COLI    Studies/Results: No results found.  Assessment: RIGHT nephrostomy tube placement  Plan: 74 yo woman with right emphysematous pyelitis/XGP kidney s/p RIGHT PCN placement.  Culture growing pan sensitive E coli.    -recommend 14 day course of antibiotics and can transition to PO based on culture sensitivities (keflex) -patient stable for discharge however will likely need home health assistance for nephrostomy tube  management -oupatient urology follow up for next week -will need to discuss right simple nephrectomy for patient   Jacalyn Lefevre, MD Urology 10/18/2019, 8:45 AM
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
## 2019-10-19 LAB — SARS CORONAVIRUS 2 (TAT 6-24 HRS): SARS Coronavirus 2: NEGATIVE

## 2019-10-19 LAB — POTASSIUM: Potassium: 3.8 mmol/L (ref 3.5–5.1)

## 2019-10-19 LAB — MAGNESIUM: Magnesium: 2.1 mg/dL (ref 1.7–2.4)

## 2019-10-19 LAB — PHOSPHORUS: Phosphorus: 2.7 mg/dL (ref 2.5–4.6)

## 2019-10-19 MED ORDER — CEPHALEXIN 500 MG PO CAPS
500.0000 mg | ORAL_CAPSULE | Freq: Three times a day (TID) | ORAL | Status: DC
  Administered 2019-10-19 – 2019-10-20 (×4): 500 mg via ORAL
  Filled 2019-10-19 (×5): qty 1

## 2019-10-19 NOTE — Progress Notes (Signed)
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  Patient is without complaint. Patient is awaiting SNF placement due to dementia and concerns for ability to take care of PCN tube on her own.    Principal Problem:   Emphysematous pyelitis Active Problems:   Atrial fibrillation (HCC)  Current Facility-Administered Medications  Medication Dose Route Frequency Provider Last Rate Last Admin  . 0.9 %  sodium chloride infusion   Intravenous Continuous Hillary Bow, DO 90 mL/hr at 10/18/19 1359 New Bag at 10/18/19 1359  . acetaminophen (TYLENOL) tablet 650 mg  650 mg Oral Q6H PRN Hillary Bow, DO   650 mg at 10/18/19 6546   Or  . acetaminophen (TYLENOL) suppository 650 mg  650 mg Rectal Q6H PRN Hillary Bow, DO      . apixaban Everlene Balls) tablet 5 mg  5 mg Oral BID Herby Abraham, RPH   5 mg at 10/18/19 2203  . cephALEXin (KEFLEX) capsule 500 mg  500 mg Oral Q8H Hall, Carole N, DO      . diltiazem (CARDIZEM CD) 24 hr capsule 240 mg  240 mg Oral Daily Lyda Perone M, DO   240 mg at 10/18/19 1023  . ondansetron (ZOFRAN) tablet 4 mg  4 mg Oral Q6H PRN Hillary Bow, DO       Or  . ondansetron [facility]) injection 4 mg  4 mg Intravenous Q6H PRN Hillary Bow, DO   4 mg at 10/18/19 0031  . potassium chloride SA (KLOR-CON) CR tablet 20 mEq  20 mEq Oral Daily Dow Adolph N, DO   20 mEq at 10/18/19 1732  . QUEtiapine (SEROQUEL) tablet 25 mg  25 mg Oral QHS Dow Adolph N, DO   25 mg at 10/18/19 2202     Objective: Vital: Vitals:   10/18/19 1346 10/18/19 1603 10/18/19 2017 10/19/19 0432  BP: 117/71  (!) 145/86 134/79  Pulse: 72  75 68  Resp: 20  16 16   Temp: 98.5 F (36.9 C)  97.7 F (36.5 C) 97.9 F (36.6 C)  TempSrc: Oral     SpO2: 99% 99% 100% 100%  Weight:       I/Os: I/O last 3 completed shifts: In: 465 [P.O.:240; Other:225] Out: 4940 [Urine:4890;  Stool:50]  Physical Exam:  General: Patient is in no apparent distress Lungs: Normal respiratory effort, chest expands symmetrically. GI:  The abdomen is soft and nontender without mass.  RIGHT PCN tube draining clear yellow urine.   Ext: lower extremity edema improving  Lab Results: No results for input(s): WBC, HGB, HCT in the last 72 hours. Recent Labs    10/17/19 1439 10/18/19 0558 10/19/19 0637  NA  --  137  --   K 3.9 3.4* 3.8  CL  --  108  --   CO2  --  21*  --   GLUCOSE  --  94  --   BUN  --  11  --   CREATININE  --  0.72  --   CALCIUM  --  8.5*  --    No results for input(s): LABPT, INR in the last 72 hours. No results for input(s): LABURIN in the last 72 hours. Results for orders placed or performed during the hospital encounter of 10/14/19  Respiratory Panel by RT PCR (Flu A&B, Covid) - Nasopharyngeal Swab     Status: None   Collection  Time: 10/14/19  6:37 PM   Specimen: Nasopharyngeal Swab  Result Value Ref Range Status   SARS Coronavirus 2 by RT PCR NEGATIVE NEGATIVE Final    Comment: (NOTE) SARS-CoV-2 target nucleic acids are NOT DETECTED. The SARS-CoV-2 RNA is generally detectable in upper respiratoy specimens during the acute phase of infection. The lowest concentration of SARS-CoV-2 viral copies this assay can detect is 131 copies/mL. A negative result does not preclude SARS-Cov-2 infection and should not be used as the sole basis for treatment or other patient management decisions. A negative result may occur with  improper specimen collection/handling, submission of specimen other than nasopharyngeal swab, presence of viral mutation(s) within the areas targeted by this assay, and inadequate number of viral copies (<131 copies/mL). A negative result must be combined with clinical observations, patient history, and epidemiological information. The expected result is Negative. Fact Sheet for Patients:  https://www.moore.com/ Fact  Sheet for Healthcare Providers:  https://www.young.biz/ This test is not yet ap proved or cleared by the [place] FDA and  has been authorized for detection and/or diagnosis of SARS-CoV-2 by FDA under an Emergency Use Authorization (EUA). This EUA will remain  in effect (meaning this test can be used) for the duration of the COVID-19 declaration under Section 564(b)(1) of the Act, 21 U.S.C. section 360bbb-3(b)(1), unless the authorization is terminated or revoked sooner.    Influenza A by PCR NEGATIVE NEGATIVE Final   Influenza B by PCR NEGATIVE NEGATIVE Final    Comment: (NOTE) The Xpert Xpress SARS-CoV-2/FLU/RSV assay is intended as an aid in  the diagnosis of influenza from Nasopharyngeal swab specimens and  should not be used as a sole basis for treatment. Nasal washings and  aspirates are unacceptable for Xpert Xpress SARS-CoV-2/FLU/RSV  testing. Fact Sheet for Patients: https://www.moore.com/ Fact Sheet for Healthcare Providers: https://www.young.biz/ This test is not yet approved or cleared by the [place] FDA and  has been authorized for detection and/or diagnosis of SARS-CoV-2 by  FDA under an Emergency Use Authorization (EUA). This EUA will remain  in effect (meaning this test can be used) for the duration of the  Covid-19 declaration under Section 564(b)(1) of the Act, 21  U.S.C. section 360bbb-3(b)(1), unless the authorization is  terminated or revoked. Performed at [facility], [address] [place], [place] [address]   Urine Culture     Status: Abnormal   Collection Time: 10/14/19  9:05 PM   Specimen: Urine, Clean Catch  Result Value Ref Range Status   Specimen Description   Final    URINE, CLEAN CATCH Performed at [facility], [address], [place], [place] [address]    Special Requests   Final    NONE Performed at [facility], [address], [place], [place] [address]    Culture MULTIPLE SPECIES PRESENT, SUGGEST RECOLLECTION (A)  Final   Report Status 10/15/2019 FINAL  Final  Aerobic/Anaerobic Culture (surgical/deep wound)     Status: None (Preliminary result)   Collection Time: 10/15/19 11:33 AM   Specimen: Kidney; Abscess  Result Value Ref Range Status   Specimen Description   Final    KIDNEY Performed at [facility], [address] [place], [place] [address]    Special Requests   Final    RIGHT Performed at [facility], [address], [place], [place] [address]    Gram Stain   Final    ABUNDANT WBC PRESENT, PREDOMINANTLY PMN ABUNDANT GRAM NEGATIVE  RODS Performed at [facility], [address], [place], [place] [address]    Culture   Final    ABUNDANT ESCHERICHIA COLI NO ANAEROBES ISOLATED; CULTURE IN PROGRESS FOR 5 DAYS    Report Status PENDING  Incomplete   Organism ID, Bacteria ESCHERICHIA COLI  Final      Susceptibility   Escherichia coli - MIC*    AMPICILLIN <=2 SENSITIVE Sensitive     CEFAZOLIN <=4 SENSITIVE Sensitive     CEFEPIME <=0.12 SENSITIVE Sensitive     CEFTAZIDIME <=1 SENSITIVE Sensitive     CEFTRIAXONE <=0.25 SENSITIVE Sensitive     CIPROFLOXACIN <=0.25 SENSITIVE Sensitive     GENTAMICIN <=1 SENSITIVE Sensitive     IMIPENEM <=0.25 SENSITIVE Sensitive     TRIMETH/SULFA <=20 SENSITIVE Sensitive     AMPICILLIN/SULBACTAM <=2 SENSITIVE Sensitive     PIP/TAZO <=4 SENSITIVE Sensitive     * ABUNDANT ESCHERICHIA COLI    Studies/Results: No results found.  Assessment: 74 yo woman with emphysematous pyelitis/XGP s/p RIGHT PCN placement.  Urine cultures show pan sensitive E. Coli and she has transitioned to PO keflex.    Plan: -continue keflex for 14 total days  -pt awaiting transition to SNF due to concern for care of PCN tube as patient has dementia and lives alone -pt to follow up with Urology to discuss nephrectomy vs  PCNL    Jacalyn Lefevre, MD Urology 10/19/2019, 9:52 AM
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
## 2019-10-19 NOTE — TOC Progression Note (Signed)
Transition of Care [facility]) - Progression Note    Patient Details  Name: [name] MRN: [id number] Date of Birth: 09/28/1945  [facility] CM/SW Contact  Ida Rogue, [place] Phone Number: 10/19/2019, 11:27 AM  Clinical Narrative:  Patient has 3 bed offers.  CSW is consulting with both patient and emergency contact to make final decision.  COVID test ordered.  Likely d/c tomorrow. TOC will continue to follow during the course of hospitalization.     Expected Discharge Plan: Skilled Nursing Facility Barriers to Discharge: No Barriers Identified  Expected Discharge Plan and Services Expected Discharge Plan: Skilled Nursing Facility   Discharge Planning Services: CM Consult   Living arrangements for the past 2 months: Apartment                                       Social Determinants of Health (SDOH) Interventions    Readmission Risk Interventions No flowsheet data found.

## 2019-10-19 NOTE — Progress Notes (Signed)
Supervising Physician: Sandi Mariscal  Patient Status:  [facility] - In-pt  Chief Complaint: Staghorn calculi with emphysematous pyelitis   Subjective: 74 y.o. female inpatient. History of a fib (on eliquis) found to have staghorn calculi and emphysematous pyelitis while being followed by urology as an outpatient. Patient was admitted and IR placed a right sided nephrostomy tube on 3.5.21. Per patient she is to be discharged to reahabilitation on 3.10.2. Patient alert and laying in bed, calm and comfortable. Denies any fevers, headache, chest pain, SOB, cough, abdominal pain, flank painm nausea, vomiting or bleeding.   Allergies: Penicillins  Medications: Prior to Admission medications   Medication Sig Start Date End Date Taking? Authorizing Provider  acetaminophen (TYLENOL 8 HOUR ARTHRITIS PAIN) 650 MG CR tablet Take 650 mg by mouth every 8 (eight) hours as needed for pain.   Yes [provider]  diltiazem (CARDIZEM CD) 240 MG 24 hr capsule TAKE 1 CAPSULE BY MOUTH DAILY. MUST KEEP UPCOMING APPT IN APRIL TO GET FURTHER REFILLS Patient taking differently: Take 240 mg by mouth daily.  12/08/18  Yes Skains, Thana Farr, MD  ELIQUIS 5 MG TABS tablet TAKE 1 TABLET BY MOUTH TWICE A DAY Patient taking differently: Take 5 mg by mouth 2 (two) times daily.  06/16/19  Yes Jerline Pain, MD     Vital Signs: BP 136/86 (BP Location: Right Arm)   Pulse 80   Temp 98.2 F (36.8 C) (Oral)   Resp 18   Wt 156 lb 12 oz (71.1 kg)   SpO2 100%   BMI 24.55 kg/m   Physical Exam Vitals and nursing note reviewed.  Constitutional:      Appearance: She is well-developed.  HENT:     Head: Normocephalic and atraumatic.  Eyes:     Conjunctiva/sclera: Conjunctivae normal.  Pulmonary:     Effort: Pulmonary effort is normal.  Genitourinary:    Comments: Right nephrostomy is unremarkable with no erythema, tenderness or drainage noted. Suture and stat lock in place. Dressing is clean dry and intact. 5 ml  of  Yellow slightly serosanginous colored fluid noted in gravity bag. Musculoskeletal:        General: Normal range of motion.     Cervical back: Normal range of motion.  Skin:    General: Skin is warm.  Neurological:     Mental Status: She is alert.     Comments: Oriented to person and place.     Imaging: No results found.  Labs:  CBC: Recent Labs    01/15/19 1332 10/14/19 1837 10/15/19 0626 10/16/19 0600  WBC 5.6 4.7 5.7 7.2  HGB 12.9 12.1 10.1* 10.9*  HCT 40.0 37.8 32.6* 35.5*  PLT 284 265 208 210    COAGS: Recent Labs    01/01/19 1029 01/08/19 1031 01/15/19 1315 10/15/19 0906  INR 1.0* 1.2* 3.6* 1.0    BMP: Recent Labs    10/14/19 1837 10/14/19 1837 10/15/19 0626 10/15/19 0626 10/16/19 0600 10/17/19 1439 10/18/19 0558 10/19/19 0637  NA 139  --  140  --  139  --  137  --   K 3.8   < > 3.9   < > 3.1* 3.9 3.4* 3.8  CL 107  --  113*  --  112*  --  108  --   CO2 22  --  22  --  21*  --  21*  --   GLUCOSE 98  --  103*  --  93  --  94  --   BUN 19  --  18  --  10  --  11  --   CALCIUM 8.7*  --  8.4*  --  8.4*  --  8.5*  --   CREATININE 0.98  --  0.87  --  0.64  --  0.72  --   GFRNONAA 57*  --  >60  --  >60  --  >60  --   GFRAA >60  --  >60  --  >60  --  >60  --    < > = values in this interval not displayed.    LIVER FUNCTION TESTS: Recent Labs    10/14/19 1837 10/16/19 0600 10/18/19 0558  BILITOT 0.9 1.5* 0.9  AST 20 21 16   ALT 12 13 14   ALKPHOS 84 67 74  PROT 7.2 6.2* 6.7  ALBUMIN 3.5 3.0* 3.2*    Assessment and Plan:  74 y.o, female inpatient. History of a fib (on eliquis) found to have staghorn calculi and emphysematous pyelitis while being followed by urology as an outpatient. Patient was admitted and IR placed a right sided nephrostomy tube on 3.5.21. Per patient she is to be discharged to reahabilitation on 3.10.21. Per Epic output is  2950 ml, 650 ml, 550 ml  Pertinent Imaging None since nephrostomy tube placement  Pertinent  IR History 3.5.21 - Placement nephrostomy tube right  Pertinent Allergies PCN   All labs s are within acceptable parameters. Patient is on eliquis for a fib. Patient is afebrile.  Per note from Dr 5.10.21 dated 3.9.21 patient to follow up with urology for possible nephrectomy versus percutaneous nephrolithotomy.    IR will continue to follow along - plans per urology, okay discharge to rehabilitation facility from IR perspective   Electronically Signed: Arita Miss, NP 10/19/2019, 2:09 PM   I spent a total of 25 Minutes at the the patient's bedside AND on the patient's hospital floor or unit, greater than 50% of which was counseling/coordinating care for right nephrostomy tube

## 2019-10-19 NOTE — Progress Notes (Signed)
PROGRESS NOTE  [name] RWE:[id number] DOB: [birth year] DOA: 10/14/2019 PCP: Glenis Smoker, MD  HPI/Recap of past 24 hours: [name] a 74 y.o.femalewith medical history significant ofparoxismal A.Fib on eliquis, presented to the urology office for follow-up of of UTI treatment.  She did have a CT scan which showed stones in the right and left kidney and gas in the collecting system on the right side so urology sent to the ED for admission.  TRH asked to admit.  Was started on abx Rocephin empirically.  Deep wound culture grew E-coli pansensitive.  Post nephrostomy tube placement on 10/15/2019 by interventional radiology Dr. Earleen Newport.  Switched to keflex on 3/8. End date 11/01/19.  She has intermittent delirium and agitation with suspected mild dementia.    10/19/19:  No acute events overnight.  Her drain has moderate output.  Denies pain.  She has no new complaints.  Agreeable to dc to SNF once bed is available.  Her POA also agrees.  Covid 19 test ordered.   Assessment/Plan: Principal Problem:   Emphysematous pyelitis Active Problems:   Atrial fibrillation (HCC)  Emphysematous pyelitis/XGP kidney status post right-sided percutaneous nephrostomy 10/15/19 by IR DR. Wagner. Deep wound culture grew abundant E. coli, pansensitive. Completed 3 days of Rocephin.  Switch to Keflex on 10/18/2019 x 14 days Appreciate assistance from interventional radiology and urology. Continue gentle IV fluid hydration  She will need to follow up with urology for possible simple nephrectomy.  Resolved Refractory hypokalemia Potassium 3.1 >> 3.4>> 3.8 Mg2+ 2.1  Paroxysmal A. fib Rate is well controlled on p.o. Cardizem On Eliquis for primary CVA prevention.  Possible dementia with delirium Intermittent agitation Continue Seroquel 25 mg nightly for delirium.  Ambulatory dysfunction PT assessment recommended SNF with 24 hours assistance/supervision Continue PT OT with assistance and  fall precautions. TOC assisting with SNF placement.    VTE Prophylaxis:  Eliquis.  Code Status: Full code  Family Communication: Updated her POA via phone.  Disposition Plan:   Patient is from home Anticipate discharge to SNF in the next 24 hours.  Barrier to discharge: SNF placement.     Consultants:  Urology  Interventional radiology.  Procedures: Status post IR guided percutaneous nephrostomy tube placement on 10/15/19 by interventional radiology Dr. Earleen Newport.    Objective: Vitals:   10/18/19 1346 10/18/19 1603 10/18/19 2017 10/19/19 0432  BP: 117/71  (!) 145/86 134/79  Pulse: 72  75 68  Resp: 20  16 16   Temp: 98.5 F (36.9 C)  97.7 F (36.5 C) 97.9 F (36.6 C)  TempSrc: Oral     SpO2: 99% 99% 100% 100%  Weight:        Intake/Output Summary (Last 24 hours) at 10/19/2019 1223 Last data filed at 10/19/2019 0500 Gross per 24 hour  Intake 465 ml  Output 2540 ml  Net -2075 ml   Filed Weights   10/14/19 2334  Weight: 71.1 kg    Exam:  . General: 74 y.o. year-old female Pleasantly demented, WD WN NAD  Cardiovascular: RRR no rubs or gallops . Respiratory: CTA no wheezes or rales .  Abdomen: soft NT ND NBS  Right nephrostomy tube in place.  Moderate output noted.   . Musculoskeletal:1+ LE edema bilaterally  Chronic venous stasis hyperpigmentation noted in bilateral lower extremity.  Marland Kitchen Psychiatry: Mood is appropriate  Data Reviewed: CBC: Recent Labs  Lab 10/14/19 1837 10/15/19 0626 10/16/19 0600  WBC 4.7 5.7 7.2  NEUTROABS 3.2  --   --  HGB 12.1 10.1* 10.9*  HCT 37.8 32.6* 35.5*  MCV 94.0 95.6 97.3  PLT 265 208 210   Basic Metabolic Panel: Recent Labs  Lab 10/14/19 1837 10/14/19 1837 10/15/19 0626 10/16/19 0600 10/17/19 1439 10/18/19 0558 10/19/19 0637  NA 139  --  140 139  --  137  --   K 3.8   < > 3.9 3.1* 3.9 3.4* 3.8  CL 107  --  113* 112*  --  108  --   CO2 22  --  22 21*  --  21*  --   GLUCOSE 98  --  103* 93  --  94  --   BUN  19  --  18 10  --  11  --   CREATININE 0.98  --  0.87 0.64  --  0.72  --   CALCIUM 8.7*  --  8.4* 8.4*  --  8.5*  --   MG  --   --   --  2.0  --   --  2.1  PHOS  --   --   --   --   --   --  2.7   < > = values in this interval not displayed.   GFR: CrCl cannot be calculated (Unknown ideal weight.). Liver Function Tests: Recent Labs  Lab 10/14/19 1837 10/16/19 0600 10/18/19 0558  AST 20 21 16   ALT 12 13 14   ALKPHOS 84 67 74  BILITOT 0.9 1.5* 0.9  PROT 7.2 6.2* 6.7  ALBUMIN 3.5 3.0* 3.2*   No results for input(s): LIPASE, AMYLASE in the last 168 hours. No results for input(s): AMMONIA in the last 168 hours. Coagulation Profile: Recent Labs  Lab 10/15/19 0906  INR 1.0   Cardiac Enzymes: No results for input(s): CKTOTAL, CKMB, CKMBINDEX, TROPONINI in the last 168 hours. BNP (last 3 results) No results for input(s): PROBNP in the last 8760 hours. HbA1C: No results for input(s): HGBA1C in the last 72 hours. CBG: No results for input(s): GLUCAP in the last 168 hours. Lipid Profile: No results for input(s): CHOL, HDL, LDLCALC, TRIG, CHOLHDL, LDLDIRECT in the last 72 hours. Thyroid Function Tests: No results for input(s): TSH, T4TOTAL, FREET4, T3FREE, THYROIDAB in the last 72 hours. Anemia Panel: No results for input(s): VITAMINB12, FOLATE, FERRITIN, TIBC, IRON, RETICCTPCT in the last 72 hours. Urine analysis:    Component Value Date/Time   COLORURINE YELLOW 10/14/2019 2105   APPEARANCEUR CLOUDY (A) 10/14/2019 2105   LABSPEC >1.046 (H) 10/14/2019 2105   PHURINE 7.0 10/14/2019 2105   GLUCOSEU NEGATIVE 10/14/2019 2105   HGBUR LARGE (A) 10/14/2019 2105   BILIRUBINUR NEGATIVE 10/14/2019 2105   KETONESUR NEGATIVE 10/14/2019 2105   PROTEINUR 100 (A) 10/14/2019 2105   UROBILINOGEN 2.0 (H) 10/14/2013 1209   NITRITE POSITIVE (A) 10/14/2019 2105   LEUKOCYTESUR LARGE (A) 10/14/2019 2105   Sepsis Labs: @LABRCNTIP (procalcitonin:4,lacticidven:4)  ) Recent Results (from the  past 240 hour(s))  Respiratory Panel by RT PCR (Flu A&B, Covid) - Nasopharyngeal Swab     Status: None   Collection Time: 10/14/19  6:37 PM   Specimen: Nasopharyngeal Swab  Result Value Ref Range Status   SARS Coronavirus 2 by RT PCR NEGATIVE NEGATIVE Final    Comment: (NOTE) SARS-CoV-2 target nucleic acids are NOT DETECTED. The SARS-CoV-2 RNA is generally detectable in upper respiratoy specimens during the acute phase of infection. The lowest concentration of SARS-CoV-2 viral copies this assay can detect is 131 copies/mL. A negative result does not preclude  SARS-Cov-2 infection and should not be used as the sole basis for treatment or other patient management decisions. A negative result may occur with  improper specimen collection/handling, submission of specimen other than nasopharyngeal swab, presence of viral mutation(s) within the areas targeted by this assay, and inadequate number of viral copies (<131 copies/mL). A negative result must be combined with clinical observations, patient history, and epidemiological information. The expected result is Negative. Fact Sheet for Patients:  https://www.moore.com/ Fact Sheet for Healthcare Providers:  https://www.young.biz/ This test is not yet ap proved or cleared by the [place] FDA and  has been authorized for detection and/or diagnosis of SARS-CoV-2 by FDA under an Emergency Use Authorization (EUA). This EUA will remain  in effect (meaning this test can be used) for the duration of the COVID-19 declaration under Section 564(b)(1) of the Act, 21 U.S.C. section 360bbb-3(b)(1), unless the authorization is terminated or revoked sooner.    Influenza A by PCR NEGATIVE NEGATIVE Final   Influenza B by PCR NEGATIVE NEGATIVE Final    Comment: (NOTE) The Xpert Xpress SARS-CoV-2/FLU/RSV assay is intended as an aid in  the diagnosis of influenza from Nasopharyngeal swab specimens and  should not be  used as a sole basis for treatment. Nasal washings and  aspirates are unacceptable for Xpert Xpress SARS-CoV-2/FLU/RSV  testing. Fact Sheet for Patients: https://www.moore.com/ Fact Sheet for Healthcare Providers: https://www.young.biz/ This test is not yet approved or cleared by the [place] FDA and  has been authorized for detection and/or diagnosis of SARS-CoV-2 by  FDA under an Emergency Use Authorization (EUA). This EUA will remain  in effect (meaning this test can be used) for the duration of the  Covid-19 declaration under Section 564(b)(1) of the Act, 21  U.S.C. section 360bbb-3(b)(1), unless the authorization is  terminated or revoked. Performed at [facility], [address] [place], [place] [address]   Urine Culture     Status: Abnormal   Collection Time: 10/14/19  9:05 PM   Specimen: Urine, Clean Catch  Result Value Ref Range Status   Specimen Description   Final    URINE, CLEAN CATCH Performed at [facility], [address], [place], [place] [address]    Special Requests   Final    NONE Performed at [facility], [address] [place], [place] [address]    Culture MULTIPLE SPECIES PRESENT, SUGGEST RECOLLECTION (A)  Final   Report Status 10/15/2019 FINAL  Final  Aerobic/Anaerobic Culture (surgical/deep wound)     Status: None (Preliminary result)   Collection Time: 10/15/19 11:33 AM   Specimen: Kidney; Abscess  Result Value Ref Range Status   Specimen Description   Final    KIDNEY Performed at [facility], [address] [place], [place] [address]    Special Requests   Final    RIGHT Performed at [facility], [address], [place], [place] [address]    Gram Stain   Final    ABUNDANT WBC PRESENT, PREDOMINANTLY PMN ABUNDANT GRAM NEGATIVE RODS Performed at [facility], [address], [place], [place]  [address]    Culture   Final    ABUNDANT ESCHERICHIA COLI NO ANAEROBES ISOLATED; CULTURE IN PROGRESS FOR 5 DAYS    Report Status PENDING  Incomplete   Organism ID, Bacteria ESCHERICHIA COLI  Final      Susceptibility   Escherichia coli - MIC*    AMPICILLIN <=2 SENSITIVE Sensitive  CEFAZOLIN <=4 SENSITIVE Sensitive     CEFEPIME <=0.12 SENSITIVE Sensitive     CEFTAZIDIME <=1 SENSITIVE Sensitive     CEFTRIAXONE <=0.25 SENSITIVE Sensitive     CIPROFLOXACIN <=0.25 SENSITIVE Sensitive     GENTAMICIN <=1 SENSITIVE Sensitive     IMIPENEM <=0.25 SENSITIVE Sensitive     TRIMETH/SULFA <=20 SENSITIVE Sensitive     AMPICILLIN/SULBACTAM <=2 SENSITIVE Sensitive     PIP/TAZO <=4 SENSITIVE Sensitive     * ABUNDANT ESCHERICHIA COLI      Studies: No results found.  Scheduled Meds: . apixaban  5 mg Oral BID  . cephALEXin  500 mg Oral Q8H  . diltiazem  240 mg Oral Daily  . potassium chloride  20 mEq Oral Daily  . QUEtiapine  25 mg Oral QHS    Continuous Infusions: . sodium chloride 90 mL/hr at 10/18/19 1359     LOS: 5 days     Darlin Drop, MD [facility] Pager [phone]  If 7PM-7AM, please contact night-coverage [email or web address] Password [facility] 10/19/2019, 12:23 PM

## 2019-10-20 ENCOUNTER — Telehealth: Payer: Self-pay | Admitting: Cardiology

## 2019-10-20 DIAGNOSIS — I480 Paroxysmal atrial fibrillation: Secondary | ICD-10-CM

## 2019-10-20 LAB — AEROBIC/ANAEROBIC CULTURE W GRAM STAIN (SURGICAL/DEEP WOUND)

## 2019-10-20 MED ORDER — CEPHALEXIN 500 MG PO CAPS: 500.0000 mg | ORAL_CAPSULE | Freq: Three times a day (TID) | ORAL | 0 refills | Status: AC

## 2019-10-20 MED ORDER — CEPHALEXIN 500 MG PO CAPS: 500.0000 mg | ORAL_CAPSULE | Freq: Three times a day (TID) | ORAL | Status: DC

## 2019-10-20 MED ORDER — QUETIAPINE FUMARATE 25 MG PO TABS: 25.0000 mg | ORAL_TABLET | Freq: Every day | ORAL | Status: DC

## 2019-10-20 NOTE — TOC Transition Note (Signed)
Transition of Care [facility]) - CM/SW Discharge Note   Patient Details  Name: [name] MRN: [id number] Date of Birth: 1946/06/04  [facility] CM/SW Contact:  Ida Rogue, LCSW Phone Number: 10/20/2019, 10:06 AM   Clinical Narrative:   Patient to transfer to [facility] today.  Authorization from 3/10-3/12 confirmed by [employer]. Reference # [id number]. FAX updated clinicals to Aleen Sells at [phone]. PTAR arranged.  Nursing, please call report to [phone], Room 3238. TOC sign off.    Final next level of care: Skilled Nursing Facility Barriers to Discharge: No Barriers Identified   Patient Goals and CMS Choice        Discharge Placement                       Discharge Plan and Services   Discharge Planning Services: CM Consult                                 Social Determinants of Health (SDOH) Interventions     Readmission Risk Interventions No flowsheet data found.

## 2019-10-20 NOTE — Telephone Encounter (Signed)
I s/w the pt and she has been scheduled for a pre op clearance appt on 11/17/19 @ 3:40 with Dr. Anne Fu. Pt asked if I could eamil her the appt. I stated that I am not able to email the appt however, I can send message to [facility] CHART or type up a little note with the appt date, time with Dr. Anne Fu. Pt thanked me for all of my help today. I will forward clearance notes to MD for upcoming appt. Will remove from the pre op call back pool. Will send FYI to surgeon Dr. Kasandra Knudsen with [facility].

## 2019-10-20 NOTE — Telephone Encounter (Signed)
Primary Cardiologist:Melissa Anne Fu, MD  Chart reviewed as part of pre-operative protocol coverage. Because of [name]'s past medical history and time since last visit, he/she will require a follow-up visit in order to better assess preoperative cardiovascular risk.  Pre-op covering staff: - Please schedule appointment and call patient to inform them. - Please contact requesting surgeon's office via preferred method (i.e, phone, fax) to inform them of need for appointment prior to surgery.  If applicable, this message will also be routed to pharmacy pool and/or primary cardiologist for input on holding anticoagulant/antiplatelet agent as requested below so that this information is available at time of patient's appointment.   [name]. Melissa Colquhoun NP-C    [facility] [address] Office [phone] Fax [phone]

## 2019-10-20 NOTE — Telephone Encounter (Signed)
° °  [facility] Pre-operative Risk Assessment    Request for surgical clearance:  1. What type of surgery is being performed? Right Kidney Removed  2. When is this surgery scheduled? TBD  3. What type of clearance is required (medical clearance vs. Pharmacy clearance to hold med vs. Both)? both  4. Are there any medications that need to be held prior to surgery and how long? Eliquis 2 days prior  5. Practice name and name of physician performing surgery? Dr. Jacalyn Lefevre - [facility]   6. What is your office phone number [phone] ext [phone]   7.   What is your office fax number [phone]  8.   Anesthesia type (None, local, MAC, general) ? General    [name] 10/20/2019, 10:27 AM  _________________________________________________________________   (provider comments below)

## 2019-10-20 NOTE — Telephone Encounter (Signed)
Patient with diagnosis of atrial fibrillation on Eliquis for anticoagulation.  Prior epidural hematoma was secondary to accident.   Procedure: right kidney removal Date of procedure: TBD  CHADS2-VASc score of 2 (AGE, female)  CrCl >60 Platelet count 210  Per office protocol, patient can hold Eliquis for 2-3 days prior to procedure.

## 2019-10-20 NOTE — Discharge Summary (Signed)
Physician Discharge Summary   Patient ID: [name] MRN: [id number] DOB/AGE: [birth year] 74 y.o.  Admit date: 10/14/2019 Discharge date: 10/20/2019  Primary Care Physician:  Glenis Smoker, MD   Recommendations for Outpatient Follow-up:  1. Follow up with PCP in 1-2 weeks 2. Continue Keflex 500 mg 3 times daily for 11 more days 3. Outpatient follow-up with urology scheduled  Home Health: Patient going to skilled nursing facility Equipment/Devices:   Discharge Condition: stable CODE STATUS: FULL    Diet recommendation: Heart healthy diet   Discharge Diagnoses:   . Emphysematous pyelitis, E. coli Status post right-sided percutaneous nephrostomy Paroxysmal atrial fibrillation ([place]) . Emphysematous pyelitis Dementia Generalized debility Hypokalemia  Consults:  Interventional radiology Urology    Allergies:   Allergies  Allergen Reactions  . Penicillins     Unknown      DISCHARGE MEDICATIONS: Allergies as of 10/20/2019      Reactions   Penicillins    Unknown       Medication List    TAKE these medications   cephALEXin 500 MG capsule Commonly known as: KEFLEX Take 1 capsule (500 mg total) by mouth 3 (three) times daily for 11 days.   diltiazem 240 MG 24 hr capsule Commonly known as: CARDIZEM CD TAKE 1 CAPSULE BY MOUTH DAILY. MUST KEEP UPCOMING APPT IN APRIL TO GET FURTHER REFILLS What changed: See the new instructions.   Eliquis 5 MG Tabs tablet Generic drug: apixaban TAKE 1 TABLET BY MOUTH TWICE A DAY What changed: how much to take   QUEtiapine 25 MG tablet Commonly known as: SEROQUEL Take 1 tablet (25 mg total) by mouth at bedtime.   Tylenol 8 Hour Arthritis Pain 650 MG CR tablet Generic drug: acetaminophen Take 650 mg by mouth every 8 (eight) hours as needed for pain.            Durable Medical Equipment  (From admission, onward)         Start     Ordered   10/16/19 1319  For home use only DME Walker rolling  Once     Question Answer Comment  Walker: With 5 Inch Wheels   Patient needs a walker to treat with the following condition Ambulatory dysfunction      10/16/19 1318   10/16/19 1318  For home use only DME 3 n 1  Once     10/16/19 1317           Brief H and P: For complete details please refer to admission H and P, but in brief patient is a 74 year old female with history of paroxysmal A. fib on Eliquis presented to urology office for follow-up of UTI treatment.  Patient had a CT scan which showed stones in the right and left kidney, gas in the collecting system on the right, patient was sent to ED for admission.  Patient was started on IV Rocephin.  Hospital Course:    Emphysematous pyelitis/XGP kidney status post right-sided percutaneous nephrostomy 10/15/19 by IR Dr Earleen Newport. CT scan showed stones in the right and left kidney, gas in the collecting system on the right, patient was sent to ED for admission.  IR was consulted Patient underwent percutaneous nephrostomy tube placement on 3/5 Deep wound culture grew abundant E. coli, pansensitive. Patient was started on IV Rocephin, transition to oral Keflex on 3/8, for 14 days  Patient was also placed on IV fluid hydration Urology follow-up has been scheduled to discuss nephrectomy versus PCNL   Hypokalemia Potassium  3.1 >> 3.4>> 3.8 Mg2+ 2.1 Resolved, patient was placed on oral potassium  Paroxysmal A. fib Rate controlled with p.o. Cardizem.   -Continue Eliquis   Possible underlying dementia Patient was noticed to have intermittent agitation during hospitalization likely delirium Continue Seroquel 25 mg nightly   Generalized debility PT assessment recommended SNF with 24 hours assistance/supervision    Day of Discharge S: No acute complaints, tolerating diet, no fevers or chills, no significant pain  BP 112/76 (BP Location: Right Arm)   Pulse (!) 58   Temp 97.9 F (36.6 C) (Oral)   Resp 16   Wt 71.1 kg   SpO2 98%   BMI  24.55 kg/m   Physical Exam: General: Alert and awake oriented x3 not in any acute distress. HEENT: anicteric sclera, pupils reactive to light and accommodation CVS: S1-S2 clear no murmur rubs or gallops Chest: clear to auscultation bilaterally, no wheezing rales or rhonchi Abdomen: soft nontender, nondistended, normal bowel sounds, right nephrostomy tube in place Extremities: no cyanosis, clubbing or edema noted bilaterally Neuro: Cranial nerves II-XII intact, no focal neurological deficits    Get Medicines reviewed and adjusted: Please take all your medications with you for your next visit with your Primary MD  Please request your Primary MD to go over all hospital tests and procedure/radiological results at the follow up. Please ask your Primary MD to get all Hospital records sent to his/her office.  If you experience worsening of your admission symptoms, develop shortness of breath, life threatening emergency, suicidal or homicidal thoughts you must seek medical attention immediately by calling 911 or calling your MD immediately  if symptoms less severe.  You must read complete instructions/literature along with all the possible adverse reactions/side effects for all the Medicines you take and that have been prescribed to you. Take any new Medicines after you have completely understood and accept all the possible adverse reactions/side effects.   Do not drive when taking pain medications.   Do not take more than prescribed Pain, Sleep and Anxiety Medications  Special Instructions: If you have smoked or chewed Tobacco  in the last 2 yrs please stop smoking, stop any regular Alcohol  and or any Recreational drug use.  Wear Seat belts while driving.  Please note  You were cared for by a hospitalist during your hospital stay. Once you are discharged, your primary care physician will handle any further medical issues. Please note that NO REFILLS for any discharge medications will be  authorized once you are discharged, as it is imperative that you return to your primary care physician (or establish a relationship with a primary care physician if you do not have one) for your aftercare needs so that they can reassess your need for medications and monitor your lab values.   The results of significant diagnostics from this hospitalization (including imaging, microbiology, ancillary and laboratory) are listed below for reference.      Procedures/Studies:  IR NEPHROSTOMY PLACEMENT RIGHT  Result Date: 10/15/2019 INDICATION: 74 year old female with a history of emphysematous pyelitis EXAM: IR NEPHROSTOMY PLACEMENT RIGHT COMPARISON:  None. MEDICATIONS: 1 g Rocephin; The antibiotic was administered in an appropriate time frame prior to skin puncture. ANESTHESIA/SEDATION: Fentanyl 150 mcg IV; Versed 4.0 mg IV Moderate Sedation Time:  13 minutes The patient was continuously monitored during the procedure by the interventional radiology nurse under my direct supervision. CONTRAST:  36mL OMNIPAQUE IOHEXOL 300 MG/ML SOLN - administered into the collecting system(s) FLUOROSCOPY TIME:  Fluoroscopy Time: 1 minutes 6  seconds (18 mGy). COMPLICATIONS: None PROCEDURE: Informed written consent was obtained from the patient after a thorough discussion of the procedural risks, benefits and alternatives. All questions were addressed. Maximal Sterile Barrier Technique was utilized including caps, mask, sterile gowns, sterile gloves, sterile drape, hand hygiene and skin antiseptic. A timeout was performed prior to the initiation of the procedure. Patient positioned prone position on the fluoroscopy table. Ultrasound survey of the right flank was performed with images stored and sent to PACs. The patient was then prepped and draped in the usual sterile fashion. 1% lidocaine was used to anesthetize the skin and subcutaneous tissues for local anesthesia. A Chiba needle was then used to access a posterior inferior  calyx with ultrasound guidance. With spontaneous urine returned through the needle, passage of an 018 micro wire into the collecting system was performed under fluoroscopy. A small incision was made with an 11 blade scalpel, and the needle was removed from the wire. An Accustick system was then advanced over the wire into the collecting system under fluoroscopy. The metal stiffener and inner dilator were removed, and then a sample of fluid was aspirated through the 4 French outer sheath. Bentson wire was passed into the collecting system and the sheath removed. Ten French dilation of the soft tissues was performed. Using modified Seldinger technique, a 10 French pigtail catheter drain was placed over the Bentson wire. Wire and inner stiffener removed, and the pigtail was formed in the collecting system. Small amount of contrast confirmed position of the catheter. Sample was sent for culture. Patient tolerated the procedure well and remained hemodynamically stable throughout. No complications were encountered and no significant blood loss encountered IMPRESSION: Status post right-sided percutaneous nephrostomy. Signed, Yvone Neu. Reyne Dumas, RPVI [facility] Electronically Signed   By: Gilmer Mor D.O.   On: 10/15/2019 12:49       LAB RESULTS: Basic Metabolic Panel: Recent Labs  Lab 10/16/19 0600 10/16/19 0600 10/17/19 1439 10/18/19 0558 10/19/19 0637  NA 139  --   --  137  --   K 3.1*  --    < > 3.4* 3.8  CL 112*  --   --  108  --   CO2 21*  --   --  21*  --   GLUCOSE 93  --   --  94  --   BUN 10  --   --  11  --   CREATININE 0.64  --   --  0.72  --   CALCIUM 8.4*  --   --  8.5*  --   MG 2.0   < >  --   --  2.1  PHOS  --   --   --   --  2.7   < > = values in this interval not displayed.   Liver Function Tests: Recent Labs  Lab 10/16/19 0600 10/18/19 0558  AST 21 16  ALT 13 14  ALKPHOS 67 74  BILITOT 1.5* 0.9  PROT 6.2* 6.7   ALBUMIN 3.0* 3.2*   No results for input(s): LIPASE, AMYLASE in the last 168 hours. No results for input(s): AMMONIA in the last 168 hours. CBC: Recent Labs  Lab 10/14/19 1837 10/14/19 1837 10/15/19 0626 10/15/19 0626 10/16/19 0600  WBC 4.7   < > 5.7  --  7.2  NEUTROABS 3.2  --   --   --   --   HGB 12.1   < > 10.1*  --  10.9*  HCT 37.8   < > 32.6*  --  35.5*  MCV 94.0   < > 95.6   < > 97.3  PLT 265   < > 208  --  210   < > = values in this interval not displayed.   Cardiac Enzymes: No results for input(s): CKTOTAL, CKMB, CKMBINDEX, TROPONINI in the last 168 hours. BNP: Invalid input(s): POCBNP CBG: No results for input(s): GLUCAP in the last 168 hours.     Disposition and Follow-up: Discharge Instructions    Diet - low sodium heart healthy   Complete by: As directed    Increase activity slowly   Complete by: As directed        DISPOSITION: Skilled nursing facility   DISCHARGE FOLLOW-UP  Contact information for follow-up providers    [facility] On 10/28/2019.   Why: 1:30pm with Dr. Katherine Roan information: [address] [place] [address] [place] [address] [phone]           Contact information for after-discharge care    Destination    [facility] Preferred SNF .   Service: Skilled Nursing Contact information: [address] [place] [address] [phone]                   Time coordinating discharge:  35-minutes  Signed:   Thad Ranger M.D. [facility] 10/20/2019, 11:33 AM

## 2019-10-20 NOTE — Telephone Encounter (Signed)
Pt states to me she is currently in the hospital and has asked for pre op clearance appt with Dr. Anne Fu be moved out just a little bit as she is not sure when she will be d/c.

## 2019-10-22 NOTE — Telephone Encounter (Signed)
Per surgeon Dr. Kasandra Knudsen pt has POA. I reached out to [employer] Guardian for the pt. I explained to her that we do not have POA on file. Per HIPPA guidelines we will need POA to be on file. While speaking with [name] I also was speaking with Molly Maduro in our HIM Dept which we coordinated for [name] to email over the POA to [employer]'s cone email. Shanda Bumps gave [name] her [employer]. We did receive the POA and it has been placed on file. I explained to [name] per Dr. Arita Miss she is wanting the pt's appt with our office to be moved up sooner so that the pt's surgery may be done by the end of March, early April. [name] will be attending the apptwith the pt as the pt does have dementia. I have noted to the appt notes. [name] thanked me for all of our help. I will forward notes to PA for upcoming appt as well as FYI to surgeon Kasandra Knudsen with [facility]. I will remove from the pre op call back pool.

## 2019-10-22 NOTE — Telephone Encounter (Signed)
I saw this patient yesterday in the office.  I would like to remove her kidney at beginning of April.  Patient has dementia and has a POA.  Could we move up her clearance so that she can be scheduled for nephrectomy.  She lives alone and is currently at a SNF but this will run out in 3 weeks.

## 2019-10-22 NOTE — Telephone Encounter (Signed)
I have asked HIM Dept to please help and see if they can find the documentation stating pt has a POA. I do not see anything that gives [place] permission to s/w anyone. We can see that there is a note that pt has a Legal Guardian though no on is listed. Per HIPPA we need documentation ok to s/w someone else on behalf of the pt.

## 2019-10-26 ENCOUNTER — Other Ambulatory Visit: Payer: Self-pay

## 2019-10-26 ENCOUNTER — Encounter: Payer: Self-pay | Admitting: Physician Assistant

## 2019-10-26 ENCOUNTER — Ambulatory Visit: Payer: Medicare Other | Admitting: Physician Assistant

## 2019-10-26 VITALS — BP 112/68 | HR 66 | Ht 67.0 in | Wt 140.0 lb

## 2019-10-26 DIAGNOSIS — I480 Paroxysmal atrial fibrillation: Secondary | ICD-10-CM

## 2019-10-26 DIAGNOSIS — R600 Localized edema: Secondary | ICD-10-CM

## 2019-10-26 DIAGNOSIS — Z01810 Encounter for preprocedural cardiovascular examination: Secondary | ICD-10-CM | POA: Diagnosis not present

## 2019-10-26 DIAGNOSIS — Z7189 Other specified counseling: Secondary | ICD-10-CM

## 2019-10-26 DIAGNOSIS — I340 Nonrheumatic mitral (valve) insufficiency: Secondary | ICD-10-CM | POA: Diagnosis not present

## 2019-10-26 NOTE — Patient Instructions (Signed)
Medication Instructions:  Your physician recommends that you continue on your current medications as directed. Please refer to the Current Medication list given to you today.  *If you need a refill on your cardiac medications before your next appointment, please call your pharmacy*   Lab Work: None ordered  If you have labs (blood work) drawn today and your tests are completely normal, you will receive your results only by: . MyChart Message (if you have MyChart) OR . A paper copy in the mail If you have any lab test that is abnormal or we need to change your treatment, we will call you to review the results.   Testing/Procedures: None ordered   Follow-Up: At [facility], you and your health needs are our priority.  As part of our continuing mission to provide you with exceptional heart care, we have created designated Provider Care Teams.  These Care Teams include your primary Cardiologist (physician) and Advanced Practice Providers (APPs -  Physician Assistants and Nurse Practitioners) who all work together to provide you with the care you need, when you need it.  We recommend signing up for the patient portal called "MyChart".  Sign up information is provided on this After Visit Summary.  MyChart is used to connect with patients for Virtual Visits (Telemedicine).  Patients are able to view lab/test results, encounter notes, upcoming appointments, etc.  Non-urgent messages can be sent to your provider as well.   To learn more about what you can do with MyChart, go to [email or web address].    Your next appointment:   6 month(s)  The format for your next appointment:   In Person  Provider:   You may see Mark Skains, MD or one of the following Advanced Practice Providers on your designated Care Team:    Lori Gerhardt, NP  Laura Ingold, NP  Jill McDaniel, NP    Other Instructions   
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
## 2019-10-28 ENCOUNTER — Other Ambulatory Visit: Payer: Self-pay | Admitting: Urology

## 2019-10-28 ENCOUNTER — Telehealth: Payer: Self-pay | Admitting: Cardiology
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
## 2019-10-28 NOTE — Telephone Encounter (Signed)
  Selena at [facility] called because the patient had a visit with Ermalinda Barrios on 10/26/19 for surgical clearance. We faxed over the visit note but the wrong procedure is listed on the note. Geraldo Pitter is requesting that we send an amended note with the correct procedure listed so they can get that to the anesthesiologist.    Request for surgical clearance:  1. What type of surgery is being performed? Right Kidney Removed  2. When is this surgery scheduled? TBD  3. What type of clearance is required (medical clearance vs. Pharmacy clearance to hold med vs. Both)? both  4. Are there any medications that need to be held prior to surgery and how long? Eliquis 2 days prior  5. Practice name and name of physician performing surgery? Dr. Jacalyn Lefevre - [facility]   6. What is your office phone number [phone] ext [phone]   7.   What is your office fax number [phone]  8.   Anesthesia type (None, local, MAC, general) ? General

## 2019-10-28 NOTE — Telephone Encounter (Signed)
error 

## 2019-11-01 NOTE — Progress Notes (Signed)
Spoke with [facility] and they do not have preprocedure instructions for Eliquis.  LVMM for Yates Decamp at [facility] and infromed to send preprocedure instructions for Eliquis by fax to [phone]. Also made Yates Decamp aware on message that [facility] can do U/a and culture prior to procedure and they will fax results to [facility] and to [facility].

## 2019-11-02 NOTE — Progress Notes (Signed)
Called and LVMM for [name] to give [facility] a call regarding patent since he is listed as guardian.  Called and LVMM for nurse to to call back to see if CBC and BMP could be done at [name].

## 2019-11-04 ENCOUNTER — Encounter (HOSPITAL_COMMUNITY): Payer: Self-pay | Admitting: Urology

## 2019-11-04 NOTE — Progress Notes (Signed)
[facility] has received preop instructions by fax.  Confirmed by nurse at [facility].  Nurse to give to [facility] and/or wife on 11/04/19 when patient is discharged to their care.  BMP to be drawn Stat at facility before patient's discharge and results will be faxed to [facility].

## 2019-11-04 NOTE — Progress Notes (Signed)
Anesthesia Review:  PCP: Cardiologist :B Bhagot,PA- clearance- 10/28/19 in epic  Chest x-ray : EKG :10/26/19-epic  Echo : Cardiac Cath :  Sleep Study/ CPAP : Fasting Blood Sugar :      / Checks Blood Sugar -- times a day:   Blood Thinner/ Instructions /Last Dose: ASA / Instructions/ Last Dose :  Eliquis - To make sure [name] has preprocedure instructions  Patient denies shortness of breath, chest pain, fever, and cough at this phone interview. Pt was resident of [facility] per Pollie Meyer on 11/04/19 Discharged from [facility] on 3/25 to his care Per [name] pt will be in his care prior to surgery. Preprocedure instructions given to [name] truhe prior to discharge from [facility]

## 2019-11-04 NOTE — Progress Notes (Addendum)
Spoke with [name] truhe by phone.  [name] is POA.  Will be bringing patient day of surgery.  Will be in [place].  He will sign consent for surgery and bring POA papers with him day of surgery.

## 2019-11-04 NOTE — Progress Notes (Addendum)
DUE TO COVID-19 ONLY ONE VISITOR IS ALLOWED TO COME WITH YOU AND STAY IN THE WAITING ROOM ONLY DURING PRE OP AND PROCEDURE DAY OF SURGERY. THE 1 VISITOR MAY VISIT WITH YOU AFTER SURGERY IN YOUR PRIVATE ROOM DURING VISITING HOURS ONLY!  YOU NEED TO HAVE A COVID 19 TEST ON_______ @_______ , THIS TEST MUST BE DONE BEFORE SURGERY, COME  [place] , [address].  ([place]) ONCE YOUR COVID TEST IS COMPLETED, PLEASE BEGIN THE QUARANTINE INSTRUCTIONS AS OUTLINED IN YOUR HANDOUT.                [name]  11/04/2019   Your procedure is scheduled on:  11/09/19  Time of Surgery 1pm-5pm  Report to [place]  Entrance   Report to admitting at   1000 AM     Call this number if you have problems the morning of surgery [phone]    Remember: Do not eat food or  :After Midnight. BRUSH YOUR TEETH MORNING OF SURGERY AND RINSE YOUR MOUTH OUT, NO CHEWING GUM CANDY OR MINTS.  Clear Liquid Diet the day before surgery.  Magnesium Citrate ( 1 Bottle)  by 12 noon day before surgery.        CLEAR LIQUID DIET   Foods Allowed                                                                     Foods Excluded  Coffee and tea, regular and decaf                             liquids that you cannot  Plain Jell-O any favor except red or purple                                           see through such as: Fruit ices (not with fruit pulp)                                     milk, soups, orange juice  Iced Popsicles                                    All solid food Carbonated beverages, regular and diet                                    Cranberry, grape and apple juices Sports drinks like Gatorade Lightly seasoned clear broth or consume(fat free) Sugar, honey syrup  Sample Menu Breakfast                                Lunch  Supper Cranberry juice                    Beef broth                            Chicken  broth Jell-O                                     Grape juice                           Apple juice Coffee or tea                        Jell-O                                      Popsicle                                                Coffee or tea                        Coffee or tea  _____________________________________________________________________    Take these medicines the morning of surgery with A SIP OF WATER:  Diltiazem CD   Eliquis instructions to come from MD.   DO NOT TAKE ANY DIABETIC MEDICATIONS DAY OF YOUR SURGERY                               You may not have any metal on your body including hair pins and              piercings  Do not wear jewelry, make-up, lotions, powders or perfumes, deodorant             Do not wear nail polish on your fingernails.  Do not shave  48 hours prior to surgery.                Do not bring valuables to the hospital. [facility] IS NOT             RESPONSIBLE   FOR VALUABLES.  Contacts, dentures or bridgework may not be worn into surgery.  Leave suitcase in the car. After surgery it may be brought to your room.     Patients discharged the day of surgery will not be allowed to drive home. IF YOU ARE HAVING SURGERY AND GOING HOME THE SAME DAY, YOU MUST HAVE AN ADULT TO DRIVE YOU HOME AND BE WITH YOU FOR 24 HOURS. YOU MAY GO HOME BY TAXI OR UBER OR ORTHERWISE, BUT AN ADULT MUST ACCOMPANY YOU HOME AND STAY WITH YOU FOR 24 HOURS.  Name and phone number of your driver:                Please read over the following fact sheets you were given: _____________________________________________________________________  [facility] - Preparing for Surgery Before surgery, you can play an important role.  Because skin is not sterile, your skin needs to be as free of  germs as possible.  You can reduce the number of germs on your skin by washing with CHG (chlorahexidine gluconate) soap before surgery.  CHG is an antiseptic cleaner which kills germs  and bonds with the skin to continue killing germs even after washing. Please DO NOT use if you have an allergy to CHG or antibacterial soaps.  If your skin becomes reddened/irritated stop using the CHG and inform your nurse when you arrive at Short Stay. Do not shave (including legs and underarms) for at least 48 hours prior to the first CHG shower.  You may shave your face/neck. Please follow these instructions carefully:  1.  Shower with CHG Soap the night before surgery and the  morning of Surgery.  2.  If you choose to wash your hair, wash your hair first as usual with your  normal  shampoo.  3.  After you shampoo, rinse your hair and body thoroughly to remove the  shampoo.                           4.  Use CHG as you would any other liquid soap.  You can apply chg directly  to the skin and wash                       Gently with a scrungie or clean washcloth.  5.  Apply the CHG Soap to your body ONLY FROM THE NECK DOWN.   Do not use on face/ open                           Wound or open sores. Avoid contact with eyes, ears mouth and genitals (private parts).                       Wash face,  Genitals (private parts) with your normal soap.             6.  Wash thoroughly, paying special attention to the area where your surgery  will be performed.  7.  Thoroughly rinse your body with warm water from the neck down.  8.  DO NOT shower/wash with your normal soap after using and rinsing off  the CHG Soap.                9.  Pat yourself dry with a clean towel.            10.  Wear clean pajamas.            11.  Place clean sheets on your bed the night of your first shower and do not  sleep with pets. Day of Surgery : Do not apply any lotions/deodorants the morning of surgery.  Please wear clean clothes to the hospital/surgery center.  FAILURE TO FOLLOW THESE INSTRUCTIONS MAY RESULT IN THE CANCELLATION OF YOUR SURGERY PATIENT SIGNATURE_________________________________  NURSE  SIGNATURE__________________________________  ________________________________________________________________________ May be sponge bath if unable to take a shower.  And can use Hibidlens or Dial antibacterial soap .  _____________________________________________________________________   
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
## 2019-11-05 NOTE — Progress Notes (Signed)
Spoke with [name] truhe ( guardian) this am and he stated he had preprocedure instructions per [facility] instructions and instrucitons from [facility].  [name] stated that he had received preprocedure instructions regarding Eliquis.

## 2019-11-05 NOTE — Anesthesia Preprocedure Evaluation (Addendum)
Anesthesia Evaluation  Patient identified by MRN, date of birth, ID band Patient awake    Reviewed: Allergy & Precautions, NPO status , Patient's Chart, lab work & pertinent test results  Airway Mallampati: II  TM Distance: >3 FB Neck ROM: Full    Dental no notable dental hx. (+) Teeth Intact   Pulmonary neg pulmonary ROS,    Pulmonary exam normal breath sounds clear to auscultation       Cardiovascular negative cardio ROS Normal cardiovascular exam+ dysrhythmias Atrial Fibrillation  Rhythm:Regular Rate:Normal     Neuro/Psych PSYCHIATRIC DISORDERS Dementia  Neuromuscular disease    GI/Hepatic Neg liver ROS,   Endo/Other  negative endocrine ROS  Renal/GU Renal diseaseEmphysematous pyelonephritis  negative genitourinary   Musculoskeletal  (+) Arthritis , Osteoarthritis,    Abdominal   Peds  Hematology  (+) anemia ,   Anesthesia Other Findings   Reproductive/Obstetrics                             Anesthesia Physical Anesthesia Plan  ASA: III  Anesthesia Plan: General   Post-op Pain Management:    Induction: Intravenous  PONV Risk Score and Plan: 3 and Ondansetron and Treatment may vary due to age or medical condition  Airway Management Planned: Oral ETT  Additional Equipment:   Intra-op Plan:   Post-operative Plan: Extubation in OR  Informed Consent: I have reviewed the patients History and Physical, chart, labs and discussed the procedure including the risks, benefits and alternatives for the proposed anesthesia with the patient or authorized representative who has indicated his/her understanding and acceptance.     Dental advisory given  Plan Discussed with: CRNA and Surgeon  Anesthesia Plan Comments: (See PAT note 11/05/19, Jodell Cipro, PA-C)       Anesthesia Quick Evaluation

## 2019-11-06 ENCOUNTER — Other Ambulatory Visit (HOSPITAL_COMMUNITY)
Admission: RE | Admit: 2019-11-06 | Discharge: 2019-11-06 | Disposition: A | Discharge status: Home / Self Care | Payer: Medicare Other | Source: Ambulatory Visit | Attending: Urology | Admitting: Urology

## 2019-11-06 DIAGNOSIS — Z01812 Encounter for preprocedural laboratory examination: Secondary | ICD-10-CM | POA: Insufficient documentation

## 2019-11-06 DIAGNOSIS — Z20822 Contact with and (suspected) exposure to covid-19: Secondary | ICD-10-CM | POA: Insufficient documentation

## 2019-11-06 LAB — SARS CORONAVIRUS 2 (TAT 6-24 HRS): SARS Coronavirus 2: NEGATIVE

## 2019-11-08 NOTE — Progress Notes (Signed)
Poke with Vara Guardian ( guardian) regarding clarification of what time of day Cardizem ( Diltiazem) is taken. On MAR from [facility] is listed as am on Meds in epic it is listed as the evening.    [name] clarified patient takes in the am.  Instructed [name] for patient to take am of surgery.  [name] voiced understanding.

## 2019-11-09 ENCOUNTER — Inpatient Hospital Stay (HOSPITAL_COMMUNITY)
Admission: RE | Admit: 2019-11-09 | Discharge: 2019-11-15 | DRG: 661 | Disposition: A | Discharge status: Home Health | Payer: Medicare Other | Attending: Urology | Admitting: Urology

## 2019-11-09 ENCOUNTER — Inpatient Hospital Stay (HOSPITAL_COMMUNITY): Payer: Medicare Other | Admitting: Physician Assistant

## 2019-11-09 ENCOUNTER — Other Ambulatory Visit: Payer: Self-pay

## 2019-11-09 ENCOUNTER — Encounter (HOSPITAL_COMMUNITY): Payer: Self-pay | Admitting: Urology

## 2019-11-09 ENCOUNTER — Encounter (HOSPITAL_COMMUNITY)
Admission: RE | Disposition: A | Discharge status: Home / Self Care | Payer: Self-pay | Source: Home / Self Care | Attending: Urology

## 2019-11-09 DIAGNOSIS — F0390 Unspecified dementia without behavioral disturbance: Secondary | ICD-10-CM | POA: Diagnosis present

## 2019-11-09 DIAGNOSIS — N111 Chronic obstructive pyelonephritis: Secondary | ICD-10-CM | POA: Diagnosis present

## 2019-11-09 DIAGNOSIS — Z7901 Long term (current) use of anticoagulants: Secondary | ICD-10-CM | POA: Diagnosis not present

## 2019-11-09 DIAGNOSIS — N118 Other chronic tubulo-interstitial nephritis: Secondary | ICD-10-CM | POA: Diagnosis present

## 2019-11-09 DIAGNOSIS — R310 Gross hematuria: Secondary | ICD-10-CM | POA: Diagnosis present

## 2019-11-09 DIAGNOSIS — Z96642 Presence of left artificial hip joint: Secondary | ICD-10-CM | POA: Diagnosis present

## 2019-11-09 DIAGNOSIS — Z20822 Contact with and (suspected) exposure to covid-19: Secondary | ICD-10-CM | POA: Diagnosis present

## 2019-11-09 DIAGNOSIS — N12 Tubulo-interstitial nephritis, not specified as acute or chronic: Secondary | ICD-10-CM | POA: Diagnosis present

## 2019-11-09 DIAGNOSIS — I4891 Unspecified atrial fibrillation: Secondary | ICD-10-CM | POA: Diagnosis present

## 2019-11-09 DIAGNOSIS — N200 Calculus of kidney: Secondary | ICD-10-CM | POA: Diagnosis present

## 2019-11-09 HISTORY — DX: Unspecified dementia, unspecified severity, without behavioral disturbance, psychotic disturbance, mood disturbance, and anxiety: F03.90

## 2019-11-09 HISTORY — DX: Cognitive communication deficit: R41.841

## 2019-11-09 HISTORY — DX: Unspecified lack of coordination: R27.9

## 2019-11-09 HISTORY — DX: Tubulo-interstitial nephritis, not specified as acute or chronic: N12

## 2019-11-09 HISTORY — PX: LAPAROSCOPIC NEPHRECTOMY, HAND ASSISTED: SHX1929

## 2019-11-09 LAB — TYPE AND SCREEN
ABO/RH(D): O POS
Antibody Screen: NEGATIVE

## 2019-11-09 LAB — HEMOGLOBIN AND HEMATOCRIT, BLOOD
HCT: 39.8 % (ref 36.0–46.0)
Hemoglobin: 12 g/dL (ref 12.0–15.0)

## 2019-11-09 SURGERY — NEPHRECTOMY, HAND-ASSISTED, LAPAROSCOPIC
Anesthesia: General | Laterality: Right

## 2019-11-09 MED ORDER — PHENYLEPHRINE 40 MCG/ML (10ML) SYRINGE FOR IV PUSH (FOR BLOOD PRESSURE SUPPORT)
PREFILLED_SYRINGE | INTRAVENOUS | Status: DC | PRN
  Administered 2019-11-09: 120 ug via INTRAVENOUS
  Administered 2019-11-09: 80 ug via INTRAVENOUS
  Administered 2019-11-09: 60 ug via INTRAVENOUS

## 2019-11-09 MED ORDER — DEXAMETHASONE SODIUM PHOSPHATE 10 MG/ML IJ SOLN
INTRAMUSCULAR | Status: DC | PRN
  Administered 2019-11-09: 8 mg via INTRAVENOUS

## 2019-11-09 MED ORDER — MORPHINE SULFATE (PF) 2 MG/ML IV SOLN
2.0000 mg | INTRAVENOUS | Status: DC | PRN
  Administered 2019-11-13: 2 mg via INTRAVENOUS
  Filled 2019-11-09: qty 1

## 2019-11-09 MED ORDER — TRAMADOL HCL 50 MG PO TABS
50.0000 mg | ORAL_TABLET | Freq: Four times a day (QID) | ORAL | Status: DC | PRN
  Administered 2019-11-09 – 2019-11-13 (×2): 50 mg via ORAL
  Filled 2019-11-09 (×2): qty 1

## 2019-11-09 MED ORDER — MAGNESIUM CITRATE PO SOLN: 1.0000 | Freq: Once | ORAL | Status: DC

## 2019-11-09 MED ORDER — ACETAMINOPHEN 10 MG/ML IV SOLN
1000.0000 mg | Freq: Four times a day (QID) | INTRAVENOUS | Status: AC
  Administered 2019-11-09 – 2019-11-10 (×4): 1000 mg via INTRAVENOUS
  Filled 2019-11-09 (×4): qty 100

## 2019-11-09 MED ORDER — LIDOCAINE 2% (20 MG/ML) 5 ML SYRINGE
INTRAMUSCULAR | Status: DC | PRN
  Administered 2019-11-09: 40 mg via INTRAVENOUS

## 2019-11-09 MED ORDER — LACTATED RINGERS IV SOLN: INTRAVENOUS | Status: DC

## 2019-11-09 MED ORDER — SODIUM CHLORIDE 0.9 % IV SOLN
1.0000 g | INTRAVENOUS | Status: DC
  Filled 2019-11-09: qty 10

## 2019-11-09 MED ORDER — ROCURONIUM BROMIDE 10 MG/ML (PF) SYRINGE
PREFILLED_SYRINGE | INTRAVENOUS | Status: AC
  Filled 2019-11-09: qty 20

## 2019-11-09 MED ORDER — PROPOFOL 10 MG/ML IV BOLUS
INTRAVENOUS | Status: AC
  Filled 2019-11-09: qty 20

## 2019-11-09 MED ORDER — LIDOCAINE 2% (20 MG/ML) 5 ML SYRINGE
INTRAMUSCULAR | Status: AC
  Filled 2019-11-09: qty 5

## 2019-11-09 MED ORDER — EPHEDRINE SULFATE-NACL 50-0.9 MG/10ML-% IV SOSY
PREFILLED_SYRINGE | INTRAVENOUS | Status: DC | PRN
  Administered 2019-11-09 (×2): 10 mg via INTRAVENOUS

## 2019-11-09 MED ORDER — DIPHENHYDRAMINE HCL 12.5 MG/5ML PO ELIX: 12.5000 mg | ORAL_SOLUTION | Freq: Four times a day (QID) | ORAL | Status: DC | PRN

## 2019-11-09 MED ORDER — BUPIVACAINE LIPOSOME 1.3 % IJ SUSP
20.0000 mL | Freq: Once | INTRAMUSCULAR | Status: AC
  Administered 2019-11-09: 20 mL
  Filled 2019-11-09: qty 20

## 2019-11-09 MED ORDER — ACETAMINOPHEN 10 MG/ML IV SOLN
INTRAVENOUS | Status: AC
  Filled 2019-11-09: qty 100

## 2019-11-09 MED ORDER — SUCCINYLCHOLINE CHLORIDE 200 MG/10ML IV SOSY
PREFILLED_SYRINGE | INTRAVENOUS | Status: DC | PRN
  Administered 2019-11-09: 100 mg via INTRAVENOUS

## 2019-11-09 MED ORDER — DIPHENHYDRAMINE HCL 50 MG/ML IJ SOLN: 12.5000 mg | Freq: Four times a day (QID) | INTRAMUSCULAR | Status: DC | PRN

## 2019-11-09 MED ORDER — QUETIAPINE FUMARATE 25 MG PO TABS
25.0000 mg | ORAL_TABLET | Freq: Every day | ORAL | Status: DC
  Administered 2019-11-09 – 2019-11-14 (×6): 25 mg via ORAL
  Filled 2019-11-09 (×6): qty 1

## 2019-11-09 MED ORDER — DEXTROSE-NACL 5-0.45 % IV SOLN: INTRAVENOUS | Status: DC

## 2019-11-09 MED ORDER — ACETAMINOPHEN 10 MG/ML IV SOLN
INTRAVENOUS | Status: DC | PRN
  Administered 2019-11-09: 1000 mg via INTRAVENOUS

## 2019-11-09 MED ORDER — LACTATED RINGERS IV SOLN
INTRAVENOUS | Status: AC | PRN
  Administered 2019-11-09: 1000 mL via INTRAVENOUS

## 2019-11-09 MED ORDER — FENTANYL CITRATE (PF) 100 MCG/2ML IJ SOLN
INTRAMUSCULAR | Status: AC
  Filled 2019-11-09: qty 2

## 2019-11-09 MED ORDER — ROCURONIUM BROMIDE 10 MG/ML (PF) SYRINGE
PREFILLED_SYRINGE | INTRAVENOUS | Status: AC
  Filled 2019-11-09: qty 10

## 2019-11-09 MED ORDER — LACTATED RINGERS IV SOLN: INTRAVENOUS | Status: DC | PRN

## 2019-11-09 MED ORDER — DOCUSATE SODIUM 100 MG PO CAPS
100.0000 mg | ORAL_CAPSULE | Freq: Two times a day (BID) | ORAL | Status: DC
  Administered 2019-11-09 – 2019-11-15 (×12): 100 mg via ORAL
  Filled 2019-11-09 (×12): qty 1

## 2019-11-09 MED ORDER — CHLORHEXIDINE GLUCONATE CLOTH 2 % EX PADS
6.0000 | MEDICATED_PAD | Freq: Every day | CUTANEOUS | Status: DC
  Administered 2019-11-10 – 2019-11-11 (×2): 6 via TOPICAL

## 2019-11-09 MED ORDER — ONDANSETRON HCL 4 MG/2ML IJ SOLN: 4.0000 mg | Freq: Once | INTRAMUSCULAR | Status: DC | PRN

## 2019-11-09 MED ORDER — DEXAMETHASONE SODIUM PHOSPHATE 10 MG/ML IJ SOLN
INTRAMUSCULAR | Status: AC
  Filled 2019-11-09: qty 1

## 2019-11-09 MED ORDER — SODIUM CHLORIDE 0.9 % IV SOLN
2.0000 g | INTRAVENOUS | Status: AC
  Administered 2019-11-09: 2 g via INTRAVENOUS

## 2019-11-09 MED ORDER — SODIUM CHLORIDE 0.9 % IV SOLN
INTRAVENOUS | Status: AC
  Filled 2019-11-09: qty 20

## 2019-11-09 MED ORDER — DILTIAZEM HCL ER COATED BEADS 240 MG PO CP24
240.0000 mg | ORAL_CAPSULE | Freq: Every evening | ORAL | Status: DC
  Administered 2019-11-10 – 2019-11-14 (×5): 240 mg via ORAL
  Filled 2019-11-09 (×5): qty 1

## 2019-11-09 MED ORDER — PROPOFOL 10 MG/ML IV BOLUS
INTRAVENOUS | Status: DC | PRN
  Administered 2019-11-09: 120 mg via INTRAVENOUS

## 2019-11-09 MED ORDER — ROCURONIUM BROMIDE 10 MG/ML (PF) SYRINGE
PREFILLED_SYRINGE | INTRAVENOUS | Status: DC | PRN
  Administered 2019-11-09: 50 mg via INTRAVENOUS
  Administered 2019-11-09: 10 mg via INTRAVENOUS
  Administered 2019-11-09: 20 mg via INTRAVENOUS

## 2019-11-09 MED ORDER — ONDANSETRON HCL 4 MG/2ML IJ SOLN
INTRAMUSCULAR | Status: AC
  Filled 2019-11-09: qty 2

## 2019-11-09 MED ORDER — ONDANSETRON HCL 4 MG/2ML IJ SOLN
INTRAMUSCULAR | Status: DC | PRN
  Administered 2019-11-09: 4 mg via INTRAVENOUS

## 2019-11-09 MED ORDER — 0.9 % SODIUM CHLORIDE (POUR BTL) OPTIME
TOPICAL | Status: DC | PRN
  Administered 2019-11-09: 1000 mL

## 2019-11-09 MED ORDER — SODIUM CHLORIDE 0.9 % IV SOLN: 2.0000 g | INTRAVENOUS | Status: DC

## 2019-11-09 MED ORDER — FENTANYL CITRATE (PF) 100 MCG/2ML IJ SOLN
25.0000 ug | INTRAMUSCULAR | Status: DC | PRN
  Administered 2019-11-09: 50 ug via INTRAVENOUS

## 2019-11-09 MED ORDER — PHENYLEPHRINE HCL-NACL 10-0.9 MG/250ML-% IV SOLN
INTRAVENOUS | Status: DC | PRN
  Administered 2019-11-09: 25 ug/min via INTRAVENOUS

## 2019-11-09 MED ORDER — SODIUM CHLORIDE 0.9 % IV SOLN
INTRAVENOUS | Status: DC | PRN
  Administered 2019-11-09: 250 mL via INTRAVENOUS

## 2019-11-09 MED ORDER — FENTANYL CITRATE (PF) 100 MCG/2ML IJ SOLN
INTRAMUSCULAR | Status: DC | PRN
  Administered 2019-11-09 (×2): 50 ug via INTRAVENOUS
  Administered 2019-11-09: 100 ug via INTRAVENOUS
  Administered 2019-11-09 (×2): 50 ug via INTRAVENOUS

## 2019-11-09 MED ORDER — BELLADONNA ALKALOIDS-OPIUM 16.2-60 MG RE SUPP: 1.0000 | Freq: Four times a day (QID) | RECTAL | Status: DC | PRN

## 2019-11-09 MED ORDER — ONDANSETRON HCL 4 MG/2ML IJ SOLN: 4.0000 mg | INTRAMUSCULAR | Status: DC | PRN

## 2019-11-09 SURGICAL SUPPLY — 68 items
ADH SKN CLS APL DERMABOND .7 (GAUZE/BANDAGES/DRESSINGS) ×1
AGENT HMST KT MTR STRL THRMB (HEMOSTASIS)
APL PRP STRL LF DISP 70% ISPRP (MISCELLANEOUS) ×2
BAG LAPAROSCOPIC 12 15 PORT 16 (BASKET) IMPLANT
BAG RETRIEVAL 12/15 (BASKET) ×2
BAG SPEC RTRVL LRG 6X4 10 (ENDOMECHANICALS)
BAG SPEC THK2 15X12 ZIP CLS (MISCELLANEOUS) ×1
BAG ZIPLOCK 12X15 (MISCELLANEOUS) ×2 IMPLANT
BLADE EXTENDED COATED 6.5IN (ELECTRODE) IMPLANT
BLADE SURG SZ10 CARB STEEL (BLADE) ×1 IMPLANT
CABLE HIGH FREQUENCY MONO STRZ (ELECTRODE) ×2 IMPLANT
CHLORAPREP W/TINT 26 (MISCELLANEOUS) ×3 IMPLANT
CLEANER TIP ELECTROSURG 2X2 (MISCELLANEOUS) IMPLANT
CLIP VESOLOCK LG 6/CT PURPLE (CLIP) ×4 IMPLANT
CLIP VESOLOCK MED LG 6/CT (CLIP) IMPLANT
CLIP VESOLOCK XL 6/CT (CLIP) IMPLANT
COVER SURGICAL LIGHT HANDLE (MISCELLANEOUS) ×2 IMPLANT
COVER WAND RF STERILE (DRAPES) ×1 IMPLANT
CUTTER FLEX LINEAR 45M (STAPLE) IMPLANT
DERMABOND ADVANCED (GAUZE/BANDAGES/DRESSINGS) ×1
DERMABOND ADVANCED .7 DNX12 (GAUZE/BANDAGES/DRESSINGS) IMPLANT
DRAIN CHANNEL 10F 3/8 F FF (DRAIN) IMPLANT
DRAPE INCISE IOBAN 66X45 STRL (DRAPES) ×2 IMPLANT
DRSG TEGADERM 4X4.75 (GAUZE/BANDAGES/DRESSINGS) IMPLANT
DRSG TEGADERM 6X8 (GAUZE/BANDAGES/DRESSINGS) ×1 IMPLANT
ELECT PENCIL ROCKER SW 15FT (MISCELLANEOUS) ×2 IMPLANT
ELECT REM PT RETURN 15FT ADLT (MISCELLANEOUS) ×2 IMPLANT
EVACUATOR SILICONE 100CC (DRAIN) IMPLANT
GAUZE SPONGE 4X4 12PLY STRL (GAUZE/BANDAGES/DRESSINGS) ×1 IMPLANT
GLOVE BIO SURGEON STRL SZ 6.5 (GLOVE) ×3 IMPLANT
GLOVE BIO SURGEON STRL SZ7.5 (GLOVE) ×2 IMPLANT
GOWN STRL REUS W/TWL LRG LVL3 (GOWN DISPOSABLE) ×4 IMPLANT
GOWN STRL REUS W/TWL XL LVL3 (GOWN DISPOSABLE) ×2 IMPLANT
HANDLE STAPLE EGIA 4 XL (STAPLE) ×1 IMPLANT
HEMOSTAT ARISTA ABSORB 3G PWDR (HEMOSTASIS) ×1 IMPLANT
HEMOSTAT SURGICEL 4X8 (HEMOSTASIS) IMPLANT
IRRIG SUCT STRYKERFLOW 2 WTIP (MISCELLANEOUS) ×2
IRRIGATION SUCT STRKRFLW 2 WTP (MISCELLANEOUS) IMPLANT
KIT BASIN OR (CUSTOM PROCEDURE TRAY) ×2 IMPLANT
KIT TURNOVER KIT A (KITS) IMPLANT
LIGASURE VESSEL 5MM BLUNT TIP (ELECTROSURGICAL) ×1 IMPLANT
MARKER SKIN SURG 5.25 VIO NS (MISCELLANEOUS) ×2 IMPLANT
POUCH SPECIMEN RETRIEVAL 10MM (ENDOMECHANICALS) IMPLANT
RELOAD 45 VASCULAR/THIN (ENDOMECHANICALS) IMPLANT
RELOAD EGIA 45 TAN VASC (STAPLE) ×4 IMPLANT
RELOAD STAPLE 45 2.5 WHT GRN (ENDOMECHANICALS) IMPLANT
SCISSORS LAP 5X35 DISP (ENDOMECHANICALS) IMPLANT
SET TUBE SMOKE EVAC HIGH FLOW (TUBING) ×2 IMPLANT
SPONGE LAP 18X18 RF (DISPOSABLE) IMPLANT
STAPLER VISISTAT 35W (STAPLE) IMPLANT
SURGIFLO W/THROMBIN 8M KIT (HEMOSTASIS) IMPLANT
SURGILUBE 2OZ TUBE FLIPTOP (MISCELLANEOUS) ×1 IMPLANT
SUT ETHILON 3 0 PS 1 (SUTURE) IMPLANT
SUT MNCRL AB 4-0 PS2 18 (SUTURE) IMPLANT
SUT PDS AB 0 CTX 60 (SUTURE) IMPLANT
SUT PDS AB 1 TP1 96 (SUTURE) ×4 IMPLANT
SUT VIC AB 2-0 SH 27 (SUTURE)
SUT VIC AB 2-0 SH 27X BRD (SUTURE) IMPLANT
SUT VICRYL 0 UR6 27IN ABS (SUTURE) IMPLANT
SYS LAPSCP GELPORT 120MM (MISCELLANEOUS) ×2
SYSTEM LAPSCP GELPORT 120MM (MISCELLANEOUS) ×1 IMPLANT
TOWEL OR 17X26 10 PK STRL BLUE (TOWEL DISPOSABLE) ×2 IMPLANT
TRAY FOLEY MTR SLVR 16FR STAT (SET/KITS/TRAYS/PACK) ×2 IMPLANT
TRAY LAPAROSCOPIC (CUSTOM PROCEDURE TRAY) ×2 IMPLANT
TROCAR BLADELESS OPT 5 100 (ENDOMECHANICALS) IMPLANT
TROCAR UNIVERSAL OPT 12M 100M (ENDOMECHANICALS) ×2 IMPLANT
TROCAR XCEL 12X100 BLDLESS (ENDOMECHANICALS) ×2 IMPLANT
YANKAUER SUCT BULB TIP 10FT TU (MISCELLANEOUS) ×1 IMPLANT

## 2019-11-09 NOTE — Transfer of Care (Signed)
Immediate Anesthesia Transfer of Care Note  Patient: [name]  Procedure(s) Performed: HAND ASSISTED LAPAROSCOPIC NEPHRECTOMY (Right )  Patient Location: PACU  Anesthesia Type:General  Level of Consciousness: drowsy and patient cooperative  Airway & Oxygen Therapy: Patient Spontanous Breathing and Patient connected to face mask oxygen  Post-op Assessment: Report given to RN and Post -op Vital signs reviewed and stable  Post vital signs: Reviewed and stable  Last Vitals:  Vitals Value Taken Time  BP    Temp    Pulse    Resp    SpO2      Last Pain:  Vitals:   11/09/19 1105  TempSrc:   PainSc: 2       Patients Stated Pain Goal: 4 (11/09/19 1105)  Complications: No apparent anesthesia complications

## 2019-11-09 NOTE — Anesthesia Postprocedure Evaluation (Deleted)
Anesthesia Post Note  Patient: [name]  Procedure(s) Performed: HAND ASSISTED LAPAROSCOPIC NEPHRECTOMY (Right )     Anesthesia Post Evaluation  Last Vitals:  Vitals:   11/09/19 1054  BP: 117/79  Pulse: 68  Resp: 16  Temp: 36.6 C  SpO2: 100%    Last Pain:  Vitals:   11/09/19 1105  TempSrc:   PainSc: 2                  Aeryn Medici RYAN

## 2019-11-09 NOTE — Op Note (Signed)
Operative Note  Preoperative diagnosis:  1.  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  Same  Procedure(s): 1.  Right hand assisted laparoscopic simple nephrectomy  Surgeon: Jacalyn Lefevre, MD  Assistants: Debbrah Alar, PA. An assistant was needed throughout the case further expertise in assisting with a laparoscopic surgery, including visualization with the camera, passing instruments, etc.  Anesthesia: General  Complications: None  EBL: 25 cc  Specimens: 1. Right Kidney  Drains/Catheters: 1. Foley catheter  Intraoperative findings: Kidney removed entirely  Indication: 74 year old woman who was found to have extensive emphysematous pyelitis associated with multiple obstructing renal calculi who underwent urgent right percutaneous nephrostomy tube.  After discussion of different options, the patient elected to undergo the above operation.  Description of procedure:  The patient was identified and consent was obtained.  The patient was taken to the operating room and placed in the supine position.  The patient was placed under general anesthesia.  Perioperative antibiotics were administered.  The patient was placed in right lateral position at approximately 65 degrees and all pressure points were padded.  Patient was prepped and draped in a standard sterile fashion and a timeout was performed.  An 8 cm periumbilical incision was made sharply into the skin.  This was carried down with Bovie electrocautery down to the anterior rectus sheath which was divided with electrocautery.  The underlying musculature was separated in the midline.  Sharp dissection with Metzenbaum scissors was used to open up the posterior sheath and peritoneum.  This was extended with electrocautery taking great care not to use cautery near the bowel.  The hand assist port was secured into the incision.  I made sure no bowel was trapped within this.  A 12  mm port was inserted through the hand assist port and the abdomen was insufflated to a pressure of 15.  A 12 mm port was placed lateral as well as superior to the hand assist port, each 1 about a hand width away.  A 5 mm port was placed superior to the midline port and used for liver retraction.  Please note that all ports were placed under direct visualization with the camera.  The colon was first dissected medially by incising along the white line of Toldt.  After medializing the colon, the kidney was dissected laterally and medially as well as superiorly.  The duodenum was dissected medially with from the renal hilum . Inferior attachments as well as the ureter and gonadal vein were clipped and then divided with LigaSure device. Dissection continued medially and identified the renal hilum.  The renal artery was divided with a 45 mm vascular staple load followed by the renal vein.  It appeared that she had 2 arteries leading to the kidney and these were taken in separate staple lines.   Superior attachments were then released using LigaSure device as well as blunt dissection.  Once the entire kidney and surrounding Gerota's fascia was freed, the specimen was withdrawn from the midline incision and passed off for permanent specimen.  The abdomen was reinspected and no active bleeding was noted.  Arista was applied to the nephrectomy bed.  The midline fascia was closed with running 1-0 looped PDS suture followed by vicryl and monocryp.  The port incisions were closed with a 2-0 Vicryl followed by monocryl.  Exparel was instilled for anesthetic effect.  Patient toleratd the procedure well and was stable postoperatively.  Plan: Stat labs will be obtained.  Anticipate the patient  will be in the hospital 1-2 nights as long as she does well.

## 2019-11-09 NOTE — Anesthesia Procedure Notes (Signed)
Procedure Name: Intubation Date/Time: 11/09/2019 1:32 PM Performed by: Silas [place], CRNA Pre-anesthesia Checklist: Patient identified, Emergency Drugs available, Suction available and Patient being monitored Patient Re-evaluated:Patient Re-evaluated prior to induction Oxygen Delivery Method: Circle system utilized Preoxygenation: Pre-oxygenation with 100% oxygen Induction Type: IV induction Ventilation: Mask ventilation without difficulty Laryngoscope Size: Mac and 3 Grade View: Grade I Tube type: Oral Tube size: 7.0 mm Number of attempts: 1 Airway Equipment and Method: Stylet Placement Confirmation: ETT inserted through vocal cords under direct vision,  positive ETCO2 and breath sounds checked- equal and bilateral Secured at: 21 cm Tube secured with: Tape Dental Injury: Teeth and Oropharynx as per pre-operative assessment  Comments: Intubated by Simona Huh, SRNA. Supervised by R. Senai Ramnath, CRNA and M. Royce Macadamia, MD.

## 2019-11-09 NOTE — H&P (Signed)
CC/HPI: cc: gross hematuria   10/21/19: 74 year old woman was referred for gross hematuria found to have severe emphysematous pyelitis on CT urogram. After the CT urogram was obtained patient was contacted that same day and instructed to go to the ER for admission. Patient underwent hospital admission on 10/14/2019 and underwent a right PCN tube on 10/15/2019. She was then sent to a SNF due to dementia and inability to care for nephrostomy tube. She returns to the office today with her pastor, [name] for discussion of next steps. Patient is feeling well overall and denies fever, chills, nausea or vomiting. She is not having any trouble with the nephrostomy tube and often forgets therapy.   10/07/19: 74 yo woman referred for gross hematuria. Patient reports having a since 2015. She is here with her pastor who is also her power of attorney. He states this is been ongoing but never fully evaluated. It does look like she was seen at [facility] in 2017. At that time she is on Coumadin but she is now on Eliquis. Patient had an accident about 6 years ago and has difficulty with memory. Patient denies frequent UTIs or kidney stones. She does not know any family history.     ALLERGIES: Penicillin    MEDICATIONS: Diltiazem 24Hr Er  Eliquis  Tylenol     GU PSH: Locm 300-399Mg /Ml Iodine,1Ml - 10/14/2019     NON-GU PSH: Hip Replacement, Left Knee Arthroscopy, Bilateral     GU PMH: Gross hematuria, We discussed possible sources of gross hematuria including kidneys, ureters, bladder urethra. We will proceed with CT urogram followed by cystoscopy. Patient may need to be treated for asymptomatic bacteria prior to cystoscopy. - 10/07/2019 Postmenopausal bleeding    NON-GU PMH: Bacteriuria, Will send urine for culture today. Based on culture results will treat prior to cystoscopy. Patient's POA, [name], [phone] to be notified. - 10/07/2019 Atrial Fibrillation Encounter for general adult medical  examination without abnormal findings, Encounter for preventive health examination Long term (current) use of anticoagulants Lymphedema, not elsewhere classified Other amnesia Other thrombophilia    FAMILY HISTORY: Family History Unknown     Notes: Pt unable to remember family history   SOCIAL HISTORY: Marital Status: Widowed Preferred Language: English; Ethnicity: Not Hispanic Or Latino; Race: White Current Smoking Status: Patient has never smoked.   Tobacco Use Assessment Completed: Used Tobacco in last 30 days? Does not drink anymore.  Does not drink caffeine.    REVIEW OF SYSTEMS:    GU Review Female:   Patient denies frequent urination, hard to postpone urination, burning /pain with urination, get up at night to urinate, leakage of urine, stream starts and stops, trouble starting your stream, have to strain to urinate, and being pregnant.  Gastrointestinal (Upper):   Patient denies nausea, vomiting, and indigestion/ heartburn.  Gastrointestinal (Lower):   Patient denies diarrhea and constipation.  Constitutional:   Patient denies fever, night sweats, weight loss, and fatigue.  Skin:   Patient denies skin rash/ lesion and itching.  Eyes:   Patient denies blurred vision and double vision.  Ears/ Nose/ Throat:   Patient denies sore throat and sinus problems.  Hematologic/Lymphatic:   Patient denies swollen glands and easy bruising.  Cardiovascular:   Patient denies leg swelling and chest pains.  Respiratory:   Patient denies cough and shortness of breath.  Endocrine:   Patient denies excessive thirst.  Musculoskeletal:   Patient denies back pain and joint pain.  Neurological:   Patient denies headaches and dizziness.  Psychologic:   Patient denies depression and anxiety.   VITAL SIGNS:      10/21/2019 10:32 AM  Weight 140 lb / 63.5 kg  Height 67 in / 170.18 cm  BP 98/66 mmHg  Pulse 80 /min  Temperature 96.9 F / 36.0 C  BMI 21.9 kg/m   MULTI-SYSTEM PHYSICAL EXAMINATION:     Constitutional: Well-nourished. No physical deformities. Normally developed. Good grooming.  Neck: Neck symmetrical, not swollen. Normal tracheal position.  Respiratory: No labored breathing, no use of accessory muscles.   Cardiovascular: Normal temperature, bilateral lower extremity edema improved from last visit. Ankles are now visible however some edema remains the knees.  Skin: No paleness, no jaundice, no cyanosis. No lesion, no ulcer, no rash.  Neurologic / Psychiatric: Oriented to time, oriented to place, oriented to person. No depression, no anxiety, no agitation.  Gastrointestinal: No mass, no tenderness, no rigidity, non obese abdomen. Right PCN tube draining well with mucus and pink tinged urine in bag  Eyes: Normal conjunctivae. Normal eyelids.  Ears, Nose, Mouth, and Throat: Left ear no scars, no lesions, no masses. Right ear no scars, no lesions, no masses. Nose no scars, no lesions, no masses. Normal hearing. Normal lips.  Musculoskeletal: Normal gait and station of head and neck.     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 The biggest decision to make was whether or not to proceed with a right simple nephrectomy versus salvage procedure to remove kidney stones. We had a frank discussion regarding patient's dementia inability to recover from surgery as well as the effect of anesthesia on her mental status. After review of the pros and cons of a nephrectomy versus possible staged PCNL/URS we decided to proceed with a right simple hand assist lap nephrectomy. The risks and benefits of this procedure were discussed with the patient including possible need for blood transfusion, bleeding, infection, damage to surrounding structures, anesthesia risks, risk of  mortality. Patient has agreed and will proceed. I would like to do this approximately 4 weeks more PCN date to let her infection had time to cough and be treated for the infection. I will call her POA with further information once I have an OR date.    PLAN:           Document Letter(s):  Created for Patient: Clinical Summary         Notes:   cc: Sela Hilding, MD

## 2019-11-09 NOTE — Anesthesia Postprocedure Evaluation (Signed)
Anesthesia Post Note  Patient: [name]  Procedure(s) Performed: HAND ASSISTED LAPAROSCOPIC NEPHRECTOMY (Right )     Patient location during evaluation: PACU Anesthesia Type: General Level of consciousness: awake and alert and oriented Pain management: pain level controlled Vital Signs Assessment: post-procedure vital signs reviewed and stable Respiratory status: spontaneous breathing, nonlabored ventilation and respiratory function stable Cardiovascular status: blood pressure returned to baseline and stable Postop Assessment: no apparent nausea or vomiting Anesthetic complications: no    Last Vitals:  Vitals:   11/09/19 1700 11/09/19 1715  BP: 135/66 131/69  Pulse: 81 74  Resp: 20 (!) 24  Temp: (!) 36.4 C   SpO2: 100% 100%    Last Pain:  Vitals:   11/09/19 1715  TempSrc:   PainSc: 0-No pain                 Matylda Fehring A.

## 2019-11-10 LAB — BASIC METABOLIC PANEL
Anion gap: 11 (ref 5–15)
BUN: 13 mg/dL (ref 8–23)
CO2: 21 mmol/L — ABNORMAL LOW (ref 22–32)
Calcium: 8.9 mg/dL (ref 8.9–10.3)
Chloride: 107 mmol/L (ref 98–111)
Creatinine, Ser: 0.95 mg/dL (ref 0.44–1.00)
GFR calc Af Amer: >60 mL/min (ref >60)
GFR calc non Af Amer: 59 mL/min — ABNORMAL LOW (ref >60)
Glucose, Bld: 175 mg/dL — ABNORMAL HIGH (ref 70–99)
Potassium: 3.9 mmol/L (ref 3.5–5.1)
Sodium: 139 mmol/L (ref 135–145)

## 2019-11-10 LAB — HEMOGLOBIN AND HEMATOCRIT, BLOOD
HCT: 38 % (ref 36.0–46.0)
Hemoglobin: 11.8 g/dL — ABNORMAL LOW (ref 12.0–15.0)

## 2019-11-10 NOTE — Progress Notes (Signed)
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  Patient pulled out foley early this AM.  She has no pain.  She ambulated yesterday evening with walker.  Active Problems:   Emphysematous pyelitis   XGP (xanthogranulomatous pyelonephritis)  Current Facility-Administered Medications  Medication Dose Route Frequency Provider Last Rate Last Admin  . 0.9 %  sodium chloride infusion   Intravenous PRN Kasandra Knudsen D, MD 10 mL/hr at 11/09/19 2019 250 mL at 11/09/19 2019  . acetaminophen (OFIRMEV) IV 1,000 mg  1,000 mg Intravenous Q6H Dancy, Marchelle Folks, PA-C 400 mL/hr at 11/10/19 0512 1,000 mg at 11/10/19 0512  . Chlorhexidine Gluconate Cloth 2 % PADS 6 each  6 each Topical Daily Chetan Mehring D, MD      . dextrose 5 %-0.45 % sodium chloride infusion   Intravenous Continuous Harrie Foreman, PA-C 75 mL/hr at 11/09/19 1855 New Bag at 11/09/19 1855  . diltiazem (CARDIZEM CD) 24 hr capsule 240 mg  240 mg Oral QPM Dancy, Amanda, PA-C      . diphenhydrAMINE (BENADRYL) injection 12.5 mg  12.5 mg Intravenous Q6H PRN Dancy, Amanda, PA-C       Or  . diphenhydrAMINE (BENADRYL) 12.5 MG/5ML elixir 12.5 mg  12.5 mg Oral Q6H PRN Dancy, Amanda, PA-C      . docusate sodium (COLACE) capsule 100 mg  100 mg Oral BID Harrie Foreman, PA-C   100 mg at 11/09/19 2215  . lactated ringers infusion   Intravenous Continuous Adline Kirshenbaum, Roselee Nova D, MD      . morphine 2 MG/ML injection 2-4 mg  2-4 mg Intravenous Q2H PRN Dancy, Amanda, PA-C      . ondansetron (ZOFRAN) injection 4 mg  4 mg Intravenous Q4H PRN Dancy, Amanda, PA-C      . opium-belladonna (B&O SUPPRETTES) 16.2-60 MG suppository 1 suppository  1 suppository Rectal Q6H PRN Dancy, Amanda, PA-C      . QUEtiapine (SEROQUEL) tablet 25 mg  25 mg Oral QHS Harrie Foreman, PA-C   25 mg at 11/09/19 2215   . traMADol (ULTRAM) tablet 50-100 mg  50-100 mg Oral Q6H PRN Harrie Foreman, PA-C   50 mg at 11/09/19 2217     Objective: Vital: Vitals:   11/09/19 1852 11/09/19 2015 11/09/19 2343 11/10/19 0419  BP: 137/82 134/77 130/84 106/69  Pulse: 81 82 88 77  Resp: 20 18 18 19   Temp: (!) 97.3 F (36.3 C) 98.4 F (36.9 C) (!) 97.4 F (36.3 C) 97.7 F (36.5 C)  TempSrc: Oral  Oral Oral  SpO2: 100% 100% 97% 96%  Weight:  63.5 kg    Height:       I/Os: I/O last 3 completed shifts: In: 3698.3 [I.V.:3198.3; IV Piggyback:500] Out: 1625 [Urine:1575; Blood:50]  Physical Exam:  General: Patient is in no apparent distress Lungs: Normal respiratory effort, chest expands symmetrically. GI: Incisions are c/d/i with mild ecchymosis around midline incision.The abdomen is soft and nontender without mass. Foley: pulled out this AM Ext: lower extremities symmetric with b/l edema, SCDs on and working  Lab Results: Recent Labs    11/09/19 1724 11/10/19 0511  HGB 12.0 11.8*  HCT 39.8 38.0   Recent Labs    11/10/19 0511  NA 139  K 3.9  CL 107  CO2 21*  GLUCOSE 175*  BUN 13  CREATININE 0.95  CALCIUM 8.9   No results for input(s): LABPT,  INR in the last 72 hours. No results for input(s): LABURIN in the last 72 hours. Results for orders placed or performed during the hospital encounter of 11/06/19  SARS CORONAVIRUS 2 (TAT 6-24 HRS) Nasopharyngeal Nasopharyngeal Swab     Status: None   Collection Time: 11/06/19  1:36 PM   Specimen: Nasopharyngeal Swab  Result Value Ref Range Status   SARS Coronavirus 2 NEGATIVE NEGATIVE Final    Comment: (NOTE) SARS-CoV-2 target nucleic acids are NOT DETECTED. The SARS-CoV-2 RNA is generally detectable in upper and lower respiratory specimens during the acute phase of infection. Negative results do not preclude SARS-CoV-2 infection, do not rule out co-infections with other pathogens, and should not be used as the sole basis for treatment or other  patient management decisions. Negative results must be combined with clinical observations, patient history, and epidemiological information. The expected result is Negative. Fact Sheet for Patients: SugarRoll.be Fact Sheet for Healthcare Providers: https://www.woods-mathews.com/ This test is not yet approved or cleared by the [place] FDA and  has been authorized for detection and/or diagnosis of SARS-CoV-2 by FDA under an Emergency Use Authorization (EUA). This EUA will remain  in effect (meaning this test can be used) for the duration of the COVID-19 declaration under Section 56 4(b)(1) of the Act, 21 U.S.C. section 360bbb-3(b)(1), unless the authorization is terminated or revoked sooner. Performed at [facility], [place] [address] [place], [place] [address]      Assessment: 74 yo woman with emphysematous peylitis s/p PCN placement and antibiotics now POD1 s/p HAND ASSISTED LAPAROSCOPIC NEPHRECTOMY, 1 Day Post-Op  doing well.  Hgb stable.  Creatinine 0.92 this AM. Will continue to trend renal function.  Plan: -continue clear liquid diet until flatus -OOB and ambulate x 3 today -SCDs while in bed -pain meds PRN (limit narcotics) -home meds -AM BMP   Jacalyn Lefevre, MD Urology 11/10/2019, 7:58 AM

## 2019-11-10 NOTE — Progress Notes (Signed)
Pt noted to have removed Foley catheter and IV. Pt in NAD. Some bleeding noted.  0630: Urology on-call paged. Stated that it would be okay to leave foley out at this time.

## 2019-11-11 LAB — BASIC METABOLIC PANEL
Anion gap: 8 (ref 5–15)
BUN: 11 mg/dL (ref 8–23)
CO2: 24 mmol/L (ref 22–32)
Calcium: 8.9 mg/dL (ref 8.9–10.3)
Chloride: 108 mmol/L (ref 98–111)
Creatinine, Ser: 0.94 mg/dL (ref 0.44–1.00)
GFR calc Af Amer: >60 mL/min (ref >60)
GFR calc non Af Amer: 60 mL/min — ABNORMAL LOW (ref >60)
Glucose, Bld: 100 mg/dL — ABNORMAL HIGH (ref 70–99)
Potassium: 4 mmol/L (ref 3.5–5.1)
Sodium: 140 mmol/L (ref 135–145)

## 2019-11-11 LAB — HEMOGLOBIN AND HEMATOCRIT, BLOOD
HCT: 35.2 % — ABNORMAL LOW (ref 36.0–46.0)
Hemoglobin: 10.9 g/dL — ABNORMAL LOW (ref 12.0–15.0)

## 2019-11-11 LAB — SURGICAL PATHOLOGY

## 2019-11-11 MED ORDER — APIXABAN 5 MG PO TABS
5.0000 mg | ORAL_TABLET | Freq: Two times a day (BID) | ORAL | Status: DC
  Administered 2019-11-11 – 2019-11-15 (×9): 5 mg via ORAL
  Filled 2019-11-11 (×9): qty 1

## 2019-11-11 MED ORDER — LIP MEDEX EX OINT
TOPICAL_OINTMENT | CUTANEOUS | Status: DC | PRN
  Filled 2019-11-11: qty 7

## 2019-11-11 NOTE — Progress Notes (Signed)
[name], MD paged regarding the POA's [name] [employer]) concern for the pt's safety upon d/c home d/t confusion that began prior to hospitalization. Pt has displayed episodes of confusion during this shift and prior shifts per report. POA mentioned that the pt was unable to find her own bathroom at her home.Verbal order given for PT consult. Case manager aware.

## 2019-11-11 NOTE — Progress Notes (Signed)
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 Patient is without complaint.  Active Problems:   Emphysematous pyelitis   XGP (xanthogranulomatous pyelonephritis)  Current Facility-Administered Medications  Medication Dose Route Frequency Provider Last Rate Last Admin  . 0.9 %  sodium chloride infusion   Intravenous PRN Jacalyn Lefevre D, MD 10 mL/hr at 11/09/19 2019 250 mL at 11/09/19 2019  . Chlorhexidine Gluconate Cloth 2 % PADS 6 each  6 each Topical Daily Robley Fries, MD   6 each at 11/10/19 1002  . dextrose 5 %-0.45 % sodium chloride infusion   Intravenous Continuous Debbrah Alar, PA-C 75 mL/hr at 11/11/19 0600 Rate Verify at 11/11/19 0600  . diltiazem (CARDIZEM CD) 24 hr capsule 240 mg  240 mg Oral QPM Dancy, Amanda, PA-C   240 mg at 11/10/19 1735  . diphenhydrAMINE (BENADRYL) injection 12.5 mg  12.5 mg Intravenous Q6H PRN Dancy, Amanda, PA-C       Or  . diphenhydrAMINE (BENADRYL) 12.5 MG/5ML elixir 12.5 mg  12.5 mg Oral Q6H PRN Dancy, Amanda, PA-C      . docusate sodium (COLACE) capsule 100 mg  100 mg Oral BID Debbrah Alar, PA-C   100 mg at 11/10/19 2210  . lactated ringers infusion   Intravenous Continuous Mykira Hofmeister, Simone Curia D, MD      . lip balm (CARMEX) ointment   Topical PRN Debbrah Alar, PA-C      . morphine 2 MG/ML injection 2-4 mg  2-4 mg Intravenous Q2H PRN Dancy, Amanda, PA-C      . ondansetron (ZOFRAN) injection 4 mg  4 mg Intravenous Q4H PRN Dancy, Amanda, PA-C      . opium-belladonna (B&O SUPPRETTES) 16.2-60 MG suppository 1 suppository  1 suppository Rectal Q6H PRN Dancy, Amanda, PA-C      . QUEtiapine (SEROQUEL) tablet 25 mg  25 mg Oral QHS Dancy, Amanda, PA-C   25 mg at 11/10/19 2210  . traMADol (ULTRAM) tablet 50-100 mg  50-100 mg Oral Q6H PRN Debbrah Alar, PA-C   50 mg at  11/09/19 2217     Objective: Vital: Vitals:   11/10/19 0916 11/10/19 1353 11/10/19 2040 11/11/19 0429  BP: 117/60 103/68 (!) 158/97 (!) 140/94  Pulse: 70 66 87 81  Resp: 18 16 20 18   Temp: 97.7 F (36.5 C) 97.6 F (36.4 C) 97.9 F (36.6 C) 97.8 F (36.6 C)  TempSrc: Oral Oral Oral Oral  SpO2: 100% 99% 99% 100%  Weight:      Height:       I/Os: I/O last 3 completed shifts: In: 3961.2 [P.O.:840; I.V.:2721.2; IV Piggyback:400] Out: 1250 [Urine:1250]  Physical Exam:  General: Patient is in no apparent distress Lungs: Normal respiratory effort, chest expands symmetrically. GI: Incisions are c/d/i with ecchymosis around midline incision. The abdomen is soft and nontender without mass. Ext: lower extremities symmetric but with baseline edema  Lab Results: Recent Labs    11/09/19 1724 11/10/19 0511 11/11/19 0618  HGB 12.0 11.8* 10.9*  HCT 39.8 38.0 35.2*   Recent Labs    11/10/19 0511 11/11/19 0618  NA 139 140  K 3.9 4.0  CL 107 108  CO2 21* 24  GLUCOSE 175* 100*  BUN 13 11  CREATININE 0.95 0.94  CALCIUM 8.9 8.9   No results for input(s): LABPT, INR in the last 72 hours. No results for input(s): LABURIN in the  last 72 hours. Results for orders placed or performed during the hospital encounter of 11/06/19  SARS CORONAVIRUS 2 (TAT 6-24 HRS) Nasopharyngeal Nasopharyngeal Swab     Status: None   Collection Time: 11/06/19  1:36 PM   Specimen: Nasopharyngeal Swab  Result Value Ref Range Status   SARS Coronavirus 2 NEGATIVE NEGATIVE Final    Comment: (NOTE) SARS-CoV-2 target nucleic acids are NOT DETECTED. The SARS-CoV-2 RNA is generally detectable in upper and lower respiratory specimens during the acute phase of infection. Negative results do not preclude SARS-CoV-2 infection, do not rule out co-infections with other pathogens, and should not be used as the sole basis for treatment or other patient management decisions. Negative results must be combined with  clinical observations, patient history, and epidemiological information. The expected result is Negative. Fact Sheet for Patients: HairSlick.no Fact Sheet for Healthcare Providers: quierodirigir.com This test is not yet approved or cleared by the [place] FDA and  has been authorized for detection and/or diagnosis of SARS-CoV-2 by FDA under an Emergency Use Authorization (EUA). This EUA will remain  in effect (meaning this test can be used) for the duration of the COVID-19 declaration under Section 56 4(b)(1) of the Act, 21 U.S.C. section 360bbb-3(b)(1), unless the authorization is terminated or revoked sooner. Performed at [facility], [address], [place], [place] [address]     Studies/Results: No results found.  Assessment: 74 yo woman with emphysematous pyelitis POD2 s/p right HAND ASSISTED LAPAROSCOPIC NEPHRECTOMY, 2 Days Post-Op  doing well.  Plan: -awaiting return of bowel function -pt remains on clears but if she feels hungry will advance diet to regular -SCDs -restart elliquis today -tylenol for pain -ambulate with walker -kidney function has stabilized -anticipate d/c once she is able to move around independently and eat (baseline dementia and lives alone)   Kasandra Knudsen, MD Urology 11/11/2019, 8:18 AM
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
## 2019-11-12 MED ORDER — ACETAMINOPHEN 325 MG PO TABS
650.0000 mg | ORAL_TABLET | Freq: Four times a day (QID) | ORAL | Status: DC | PRN
  Administered 2019-11-12: 650 mg via ORAL
  Filled 2019-11-12: qty 2

## 2019-11-12 NOTE — Care Management Important Message (Signed)
Important Message  Patient Details IM Letter given to Lanier Clam RN Case Manager to present to the Patient Name: [name] MRN: [id number] Date of Birth: Aug 06, 1946   Medicare Important Message Given:  Yes     Caren Macadam 11/12/2019, 10:14 AM
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
## 2019-11-12 NOTE — Progress Notes (Signed)
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  Patient is without complaint.  +Flatus this AM.  Concern for mental status/dementia from POA.    Active Problems:   Emphysematous pyelitis   XGP (xanthogranulomatous pyelonephritis)  Current Facility-Administered Medications  Medication Dose Route Frequency Provider Last Rate Last Admin  . 0.9 %  sodium chloride infusion   Intravenous PRN Jacalyn Lefevre D, MD 10 mL/hr at 11/09/19 2019 250 mL at 11/09/19 2019  . apixaban (ELIQUIS) tablet 5 mg  5 mg Oral BID Jacalyn Lefevre D, MD   5 mg at 11/11/19 2120  . Chlorhexidine Gluconate Cloth 2 % PADS 6 each  6 each Topical Daily Robley Fries, MD   6 each at 11/11/19 [phone]  . dextrose 5 %-0.45 % sodium chloride infusion   Intravenous Continuous Debbrah Alar, PA-C 75 mL/hr at 11/11/19 2114 New Bag at 11/11/19 2114  . diltiazem (CARDIZEM CD) 24 hr capsule 240 mg  240 mg Oral QPM Dancy, Amanda, PA-C   240 mg at 11/11/19 1728  . diphenhydrAMINE (BENADRYL) injection 12.5 mg  12.5 mg Intravenous Q6H PRN Dancy, Amanda, PA-C       Or  . diphenhydrAMINE (BENADRYL) 12.5 MG/5ML elixir 12.5 mg  12.5 mg Oral Q6H PRN Dancy, Amanda, PA-C      . docusate sodium (COLACE) capsule 100 mg  100 mg Oral BID Debbrah Alar, PA-C   100 mg at 11/11/19 2120  . lactated ringers infusion   Intravenous Continuous Brady Plant, Simone Curia D, MD      . lip balm (CARMEX) ointment   Topical PRN Debbrah Alar, PA-C      . morphine 2 MG/ML injection 2-4 mg  2-4 mg Intravenous Q2H PRN Dancy, Amanda, PA-C      . ondansetron (ZOFRAN) injection 4 mg  4 mg Intravenous Q4H PRN Dancy, Amanda, PA-C      . opium-belladonna (B&O SUPPRETTES) 16.2-60 MG suppository 1 suppository  1 suppository Rectal Q6H PRN Dancy, Amanda, PA-C      . QUEtiapine (SEROQUEL) tablet 25 mg   25 mg Oral QHS Debbrah Alar, PA-C   25 mg at 11/11/19 2120  . traMADol (ULTRAM) tablet 50-100 mg  50-100 mg Oral Q6H PRN Debbrah Alar, PA-C   50 mg at 11/09/19 2217     Objective: Vital: Vitals:   11/11/19 0429 11/11/19 1345 11/11/19 2102 11/12/19 0500  BP: (!) 140/94 112/73 139/86 140/80  Pulse: 81 73 86 84  Resp: 18 17 17 18   Temp: 97.8 F (36.6 C) 98.2 F (36.8 C) 97.6 F (36.4 C) 98 F (36.7 C)  TempSrc: Oral Oral Oral Oral  SpO2: 100% 98% 94% 97%  Weight:      Height:       I/Os: I/O last 3 completed shifts: In: 2323.9 [P.O.:440; I.V.:1883.9] Out: -   Physical Exam:  General: Patient is in no apparent distress Lungs: Normal respiratory effort, chest expands symmetrically. GI: Incisions are c/d/i. The abdomen is soft and nontender without mass. Ext: lower extremities symmetric  Lab Results: Recent Labs    11/09/19 1724 11/10/19 0511 11/11/19 0618  HGB 12.0 11.8* 10.9*  HCT 39.8 38.0 35.2*   Recent Labs    11/10/19 0511 11/11/19 0618  NA 139 140  K 3.9 4.0  CL 107 108  CO2 21* 24  GLUCOSE 175* 100*  BUN 13 11  CREATININE 0.95  0.94  CALCIUM 8.9 8.9   No results for input(s): LABPT, INR in the last 72 hours. No results for input(s): LABURIN in the last 72 hours. Results for orders placed or performed during the hospital encounter of 11/06/19  SARS CORONAVIRUS 2 (TAT 6-24 HRS) Nasopharyngeal Nasopharyngeal Swab     Status: None   Collection Time: 11/06/19  1:36 PM   Specimen: Nasopharyngeal Swab  Result Value Ref Range Status   SARS Coronavirus 2 NEGATIVE NEGATIVE Final    Comment: (NOTE) SARS-CoV-2 target nucleic acids are NOT DETECTED. The SARS-CoV-2 RNA is generally detectable in upper and lower respiratory specimens during the acute phase of infection. Negative results do not preclude SARS-CoV-2 infection, do not rule out co-infections with other pathogens, and should not be used as the sole basis for treatment or other patient management  decisions. Negative results must be combined with clinical observations, patient history, and epidemiological information. The expected result is Negative. Fact Sheet for Patients: HairSlick.no Fact Sheet for Healthcare Providers: quierodirigir.com This test is not yet approved or cleared by the [place] FDA and  has been authorized for detection and/or diagnosis of SARS-CoV-2 by FDA under an Emergency Use Authorization (EUA). This EUA will remain  in effect (meaning this test can be used) for the duration of the COVID-19 declaration under Section 56 4(b)(1) of the Act, 21 U.S.C. section 360bbb-3(b)(1), unless the authorization is terminated or revoked sooner. Performed at [facility], [address] [place], [place] [address]     Studies/Results: No results found.  Assessment: 74 yo woman with right emphysematous peylitis s/p  HAND ASSISTED LAPAROSCOPIC NEPHRECTOMY, 3 Days Post-Op  doing well.   Patient has return of bowel function this AM.  Kidney function stable.  Plan: -advance to general diet -continue home medication including eliquis -tylenol for pain meds -SCDs for DVT ppx -PT consult for possible SNF placement -d/c pending PT evaluation   Kasandra Knudsen, MD Urology 11/12/2019, 9:38 AM

## 2019-11-13 NOTE — Progress Notes (Addendum)
[name] and husband, [name], called later in the day to state that after talking further, they are feeling overwhelmed.  They state they have someone to help with care for about 40 hours per week but really need help finding care for night time and on the weekends.    Both request case management help in making a plan.    RN spoke with [name], patient's legal guardian as she was leaving from a visit with the patient.  Ms. Morey Hummingbird and her husband are working on a plan for patient to be able to return home with 24-hour care.  Ms. Morey Hummingbird stated they are aiming to have a plan in place by Monday evening, April 5.

## 2019-11-13 NOTE — Progress Notes (Signed)
4 Days Post-Op Subjective: Patient reports feeling fine. No SNF placement yet. + BM  Objective: Vital signs in last 24 hours: Temp:  [97.6 F (36.4 C)-98.4 F (36.9 C)] 97.7 F (36.5 C) (04/03 0513) Pulse Rate:  [80-101] 101 (04/03 0513) Resp:  [16-18] 18 (04/03 0513) BP: (109-135)/(74-92) 127/92 (04/03 0513) SpO2:  [97 %-99 %] 99 % (04/03 0513)  Intake/Output from previous day: 04/02 0701 - 04/03 0700 In: 120 [P.O.:120] Out: 1050 [Urine:1050] Intake/Output this shift: No intake/output data recorded.  Physical Exam:  Constitutional: Vital signs reviewed. WD WN in NAD   Eyes: PERRL, No scleral icterus.   Cardiovascular: Reg rate Pulmonary/Chest: Normal effort Abdominal: Soft. Non-tender, non-distended, incisions healing well   Extremities: No cyanosis or edema   Lab Results: Recent Labs    11/11/19 0618  HGB 10.9*  HCT 35.2*   BMET Recent Labs    11/11/19 0618  NA 140  K 4.0  CL 108  CO2 24  GLUCOSE 100*  BUN 11  CREATININE 0.94  CALCIUM 8.9   No results for input(s): LABPT, INR in the last 72 hours. No results for input(s): LABURIN in the last 72 hours. Results for orders placed or performed during the hospital encounter of 11/06/19  SARS CORONAVIRUS 2 (TAT 6-24 HRS) Nasopharyngeal Nasopharyngeal Swab     Status: None   Collection Time: 11/06/19  1:36 PM   Specimen: Nasopharyngeal Swab  Result Value Ref Range Status   SARS Coronavirus 2 NEGATIVE NEGATIVE Final    Comment: (NOTE) SARS-CoV-2 target nucleic acids are NOT DETECTED. The SARS-CoV-2 RNA is generally detectable in upper and lower respiratory specimens during the acute phase of infection. Negative results do not preclude SARS-CoV-2 infection, do not rule out co-infections with other pathogens, and should not be used as the sole basis for treatment or other patient management decisions. Negative results must be combined with clinical observations, patient history, and epidemiological  information. The expected result is Negative. Fact Sheet for Patients: HairSlick.no Fact Sheet for Healthcare Providers: quierodirigir.com This test is not yet approved or cleared by the [place] FDA and  has been authorized for detection and/or diagnosis of SARS-CoV-2 by FDA under an Emergency Use Authorization (EUA). This EUA will remain  in effect (meaning this test can be used) for the duration of the COVID-19 declaration under Section 56 4(b)(1) of the Act, 21 U.S.C. section 360bbb-3(b)(1), unless the authorization is terminated or revoked sooner. Performed at [facility], [address], [place], [place] [address]     Studies/Results: No results found.  Assessment/Plan:   PO nephrectomy. Doing well. PT consult done. Pt not ready for d/c alone, will likely need SNF--pending   LOS: 4 days   [name] Aus 11/13/2019, 10:21 AM
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
## 2019-11-14 NOTE — Progress Notes (Signed)
5 Days Post-Op Subjective: Patient reports no problems--feeling good. Objective: Vital signs in last 24 hours: Temp:  [98 F (36.7 C)-98.5 F (36.9 C)] 98.5 F (36.9 C) (04/04 0509) Pulse Rate:  [70-84] 84 (04/04 0509) Resp:  [16-18] 16 (04/04 0509) BP: (108-141)/(69-80) 108/69 (04/04 0509) SpO2:  [95 %-99 %] 97 % (04/04 0509)  Intake/Output from previous day: 04/03 0701 - 04/04 0700 In: 780 [P.O.:780] Out: -  Intake/Output this shift: No intake/output data recorded.  Physical Exam:  Constitutional: Vital signs reviewed. WD WN in NAD   Eyes: PERRL, No scleral icterus.   Cardiovascular: RRR Pulmonary/Chest: Normal effort Extremities: No cyanosis or edema   Lab Results: No results for input(s): HGB, HCT in the last 72 hours. BMET No results for input(s): NA, K, CL, CO2, GLUCOSE, BUN, CREATININE, CALCIUM in the last 72 hours. No results for input(s): LABPT, INR in the last 72 hours. No results for input(s): LABURIN in the last 72 hours. Results for orders placed or performed during the hospital encounter of 11/06/19  SARS CORONAVIRUS 2 (TAT 6-24 HRS) Nasopharyngeal Nasopharyngeal Swab     Status: None   Collection Time: 11/06/19  1:36 PM   Specimen: Nasopharyngeal Swab  Result Value Ref Range Status   SARS Coronavirus 2 NEGATIVE NEGATIVE Final    Comment: (NOTE) SARS-CoV-2 target nucleic acids are NOT DETECTED. The SARS-CoV-2 RNA is generally detectable in upper and lower respiratory specimens during the acute phase of infection. Negative results do not preclude SARS-CoV-2 infection, do not rule out co-infections with other pathogens, and should not be used as the sole basis for treatment or other patient management decisions. Negative results must be combined with clinical observations, patient history, and epidemiological information. The expected result is Negative. Fact Sheet for Patients: HairSlick.no Fact Sheet for Healthcare  Providers: quierodirigir.com This test is not yet approved or cleared by the [place] FDA and  has been authorized for detection and/or diagnosis of SARS-CoV-2 by FDA under an Emergency Use Authorization (EUA). This EUA will remain  in effect (meaning this test can be used) for the duration of the COVID-19 declaration under Section 56 4(b)(1) of the Act, 21 U.S.C. section 360bbb-3(b)(1), unless the authorization is terminated or revoked sooner. Performed at [facility], [address] [place], [place] [address]     Studies/Results: No results found.  Assessment/Plan:   POD 5 Rt nephrectomy. Doing well. Looks like home tomorrow.   LOS: 5 days   [name] Aus 11/14/2019, 8:59 AM

## 2019-11-15 NOTE — Care Management Important Message (Signed)
Important Message  Patient Details IM Letter given to Lanier Clam RN Case Manager to present to the Patient Name: [name] MRN: [id number] Date of Birth: 06/11/1946   Medicare Important Message Given:  Yes     Caren Macadam 11/15/2019, 12:05 PM

## 2019-11-15 NOTE — Progress Notes (Signed)
Pt discharged from the unit via wheelchair. Discharge instructions were reviewed with the pt and family member at bedside. No questions or concerns at this time.

## 2019-11-15 NOTE — Progress Notes (Signed)
Urology Inpatient Progress Report  Emphysematous pyelitis [N12] XGP (xanthogranulomatous pyelonephritis) [N11.8]  Procedure(s): HAND ASSISTED LAPAROSCOPIC NEPHRECTOMY  6 Days Post-Op   Intv/Subj: No acute events overnight.  Patient is without complaint.  Active Problems:   Emphysematous pyelitis   XGP (xanthogranulomatous pyelonephritis)  Current Facility-Administered Medications  Medication Dose Route Frequency Provider Last Rate Last Admin  . 0.9 %  sodium chloride infusion   Intravenous PRN Jacalyn Lefevre D, MD 10 mL/hr at 11/09/19 2019 250 mL at 11/09/19 2019  . acetaminophen (TYLENOL) tablet 650 mg  650 mg Oral Q6H PRN Jacalyn Lefevre D, MD   650 mg at 11/12/19 1219  . apixaban (ELIQUIS) tablet 5 mg  5 mg Oral BID Jacalyn Lefevre D, MD   5 mg at 11/15/19 1025  . diltiazem (CARDIZEM CD) 24 hr capsule 240 mg  240 mg Oral QPM Dancy, Amanda, PA-C   240 mg at 11/14/19 1725  . diphenhydrAMINE (BENADRYL) injection 12.5 mg  12.5 mg Intravenous Q6H PRN Dancy, Amanda, PA-C       Or  . diphenhydrAMINE (BENADRYL) 12.5 MG/5ML elixir 12.5 mg  12.5 mg Oral Q6H PRN Dancy, Amanda, PA-C      . docusate sodium (COLACE) capsule 100 mg  100 mg Oral BID Debbrah Alar, PA-C   100 mg at 11/15/19 1025  . lactated ringers infusion   Intravenous Continuous Taurus Willis, Simone Curia D, MD      . lip balm (CARMEX) ointment   Topical PRN Debbrah Alar, PA-C      . morphine 2 MG/ML injection 2-4 mg  2-4 mg Intravenous Q2H PRN Debbrah Alar, PA-C   2 mg at 11/13/19 0330  . ondansetron (ZOFRAN) injection 4 mg  4 mg Intravenous Q4H PRN Dancy, Amanda, PA-C      . opium-belladonna (B&O SUPPRETTES) 16.2-60 MG suppository 1 suppository  1 suppository Rectal Q6H PRN Dancy, Amanda, PA-C      . QUEtiapine (SEROQUEL) tablet 25 mg  25 mg Oral QHS Debbrah Alar, PA-C   25 mg at 11/14/19 2137  . traMADol (ULTRAM) tablet 50-100 mg  50-100 mg Oral Q6H PRN Debbrah Alar, PA-C   50 mg at 11/13/19 0258      Objective: Vital: Vitals:   11/14/19 1416 11/14/19 1725 11/14/19 2027 11/15/19 0440  BP: 98/69 116/78 (!) 140/92 99/70  Pulse: 76  90 74  Resp: 18  18 18   Temp: 97.9 F (36.6 C)  97.7 F (36.5 C) 97.6 F (36.4 C)  TempSrc: Oral  Oral Oral  SpO2: 99%  97% 97%  Weight:      Height:       I/Os: I/O last 3 completed shifts: In: 480 [P.O.:480] Out: -   Physical Exam:  General: Patient is in no apparent distress Lungs: Normal respiratory effort, chest expands symmetrically. GI: Incisions are c/d/i with dermabond peeling.The abdomen is soft and nontender without mass.  Ext: lower extremities symmetric  Lab Results: No results for input(s): WBC, HGB, HCT in the last 72 hours. No results for input(s): NA, K, CL, CO2, GLUCOSE, BUN, CREATININE, CALCIUM in the last 72 hours. No results for input(s): LABPT, INR in the last 72 hours. No results for input(s): LABURIN in the last 72 hours. Results for orders placed or performed during the hospital encounter of 11/06/19  SARS CORONAVIRUS 2 (TAT 6-24 HRS) Nasopharyngeal Nasopharyngeal Swab     Status: None   Collection Time: 11/06/19  1:36 PM   Specimen: Nasopharyngeal Swab  Result Value Ref Range Status  SARS Coronavirus 2 NEGATIVE NEGATIVE Final    Comment: (NOTE) SARS-CoV-2 target nucleic acids are NOT DETECTED. The SARS-CoV-2 RNA is generally detectable in upper and lower respiratory specimens during the acute phase of infection. Negative results do not preclude SARS-CoV-2 infection, do not rule out co-infections with other pathogens, and should not be used as the sole basis for treatment or other patient management decisions. Negative results must be combined with clinical observations, patient history, and epidemiological information. The expected result is Negative. Fact Sheet for Patients: SugarRoll.be Fact Sheet for Healthcare Providers: https://www.woods-mathews.com/ This  test is not yet approved or cleared by the [place] FDA and  has been authorized for detection and/or diagnosis of SARS-CoV-2 by FDA under an Emergency Use Authorization (EUA). This EUA will remain  in effect (meaning this test can be used) for the duration of the COVID-19 declaration under Section 56 4(b)(1) of the Act, 21 U.S.C. section 360bbb-3(b)(1), unless the authorization is terminated or revoked sooner. Performed at [facility], [place] [address] [place], [place] [address]     Studies/Results: No results found.  Assessment: 74 yo woman with hx of right emphysematous pyelitis POD5 s/p RIGHT HALNx doing well.  Plan: -pt has met criteria for d/c from urology stand point but has been waiting to discuss plan for SNF vs return home  -appreciate case manager help and pt to have in home assistance -d/c home today -follow up in 2 weeks -continue home meds -no discharge medication needed   Jacalyn Lefevre, MD Urology 11/15/2019, 11:44 AM

## 2019-11-15 NOTE — TOC Transition Note (Signed)
Transition of Care [facility]) - CM/SW Discharge Note   Patient Details  Name: CIDNEY KIRKWOOD MRN: [id number] Date of Birth: 07-Nov-1945  [facility]) CM/SW Contact:  Lanier Clam, RN Phone Number: 11/15/2019, 11:34 AM   Clinical Narrative: Spoke to patient in rm w/Legal guardians-patient declines SNF;agree to home w/HHC;informed of private duty care services(out of pocket cost-custodial level care)[facility] chsoen for HHC-HHPT/OT/aide/csw-rep Kandee Keen aware of d/c.Legal guardians will assist w/getting private duty care services in place.No further CM needs.      Final next level of care: Home w Home Health Services Barriers to Discharge: No Barriers Identified   Patient Goals and CMS Choice Patient states their goals for this hospitalization and ongoing recovery are:: go home CMS Medicare.gov Compare Post Acute Care list provided to:: Patient Choice offered to / list presented to : Patient  Discharge Placement                       Discharge Plan and Services   Discharge Planning Services: CM Consult Post Acute Care Choice: Home Health                    HH Arranged: OT, PT, Nurse's Aide, Social Work [facility] Agency: [facility] Date [facility] Agency Contacted: 11/15/19 Time HH Agency Contacted: 1134 Representative spoke with at [facility] Agency: Kandee Keen  Social Determinants of Health (SDOH) Interventions     Readmission Risk Interventions No flowsheet data found.
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
## 2019-11-15 NOTE — Plan of Care (Signed)

## 2019-11-15 NOTE — Discharge Instructions (Signed)
1.  Activity:  You are encouraged to ambulate frequently (about every hour during waking hours) to help prevent blood clots from forming in your legs or lungs.  However, you should not engage in any heavy lifting (> 10-15 lbs), strenuous activity, or straining. 2. Diet: You should advance your diet as instructed by your physician.  It will be normal to have some bloating, nausea, and abdominal discomfort intermittently.  Try and eat a balanced diet.  Drink plenty of water.  3. Prescriptions:  You may take extra strength Tylenol for pain.   4. Incisions: You have special tissue glue at each of the incision sites. Once the bandages are removed (if present), the incisions may stay open to air.  You may start showering (but not soaking or bathing in water) the 2nd day after surgery and the incisions simply need to be patted dry after the shower.  No additional care is needed. 5. What to call us about: You should call the office [phone]) if you develop fever > 101 or develop persistent vomiting.

## 2019-11-16 ENCOUNTER — Ambulatory Visit: Payer: Medicare Other | Admitting: Cardiology

## 2019-11-23 ENCOUNTER — Other Ambulatory Visit: Payer: Self-pay | Admitting: Cardiology

## 2019-12-24 ENCOUNTER — Ambulatory Visit: Payer: Medicare Other | Admitting: Neurology

## 2019-12-24 ENCOUNTER — Other Ambulatory Visit: Payer: Self-pay

## 2019-12-24 ENCOUNTER — Encounter: Payer: Self-pay | Admitting: Neurology

## 2019-12-24 ENCOUNTER — Telehealth: Payer: Self-pay | Admitting: Neurology

## 2019-12-24 VITALS — BP 145/80 | HR 76 | Ht 67.0 in | Wt 143.0 lb

## 2019-12-24 DIAGNOSIS — F03A0 Unspecified dementia, mild, without behavioral disturbance, psychotic disturbance, mood disturbance, and anxiety: Secondary | ICD-10-CM

## 2019-12-24 DIAGNOSIS — F0390 Unspecified dementia without behavioral disturbance: Secondary | ICD-10-CM

## 2019-12-24 MED ORDER — QUETIAPINE FUMARATE 25 MG PO TABS: 25.0000 mg | ORAL_TABLET | Freq: Every day | ORAL | 11 refills | Status: DC

## 2019-12-24 MED ORDER — DONEPEZIL HCL 10 MG PO TABS: ORAL_TABLET | ORAL | 11 refills | Status: DC

## 2019-12-24 NOTE — Telephone Encounter (Signed)
Pt needs a brand new RX for the serquel called into the [facility] on [address] [address] [facility]

## 2019-12-24 NOTE — Patient Instructions (Signed)
1. Schedule MRI brain without contrast  2. Start Donepezil (Aricept) 10mg : take 1/2 tablet daily for 2 weeks, then increase to 1 tablet daily  3. Continue Seroquel 25mg  every night  4. Continue close supervision  5. Follow-up in 6 months, call for any changes  FALL PRECAUTIONS: Be cautious when walking. Scan the area for obstacles that may increase the risk of trips and falls. When getting up in the mornings, sit up at the edge of the bed for a few minutes before getting out of bed. Consider elevating the bed at the head end to avoid drop of blood pressure when getting up. Walk always in a well-lit room (use night lights in the walls). Avoid area rugs or power cords from appliances in the middle of the walkways. Use a walker or a cane if necessary and consider physical therapy for balance exercise. Get your eyesight checked regularly.   HOME SAFETY: Consider the safety of the kitchen when operating appliances like stoves, microwave oven, and blender. Consider having supervision and share cooking responsibilities until no longer able to participate in those. Accidents with firearms and other hazards in the house should be identified and addressed as well.  ABILITY TO BE LEFT ALONE: If patient is unable to contact 911 operator, consider using LifeLine, or when the need is there, arrange for someone to stay with patients. Smoking is a fire hazard, consider supervision or cessation. Risk of wandering should be assessed by caregiver and if detected at any point, supervision and safe proof recommendations should be instituted.  MEDICATION SUPERVISION: Inability to self-administer medication needs to be constantly addressed. Implement a mechanism to ensure safe administration of the medications.  RECOMMENDATIONS FOR ALL PATIENTS WITH MEMORY PROBLEMS: 1. Continue to exercise (Recommend 30 minutes of walking everyday, or 3 hours every week) 2. Increase social interactions - continue going to [place] and  enjoy social gatherings with friends and family 3. Eat healthy, avoid fried foods and eat more fruits and vegetables 4. Maintain adequate blood pressure, blood sugar, and blood cholesterol level. Reducing the risk of stroke and cardiovascular disease also helps promoting better memory. 5. Avoid stressful situations. Live a simple life and avoid aggravations. Organize your time and prepare for the next day in anticipation. 6. Sleep well, avoid any interruptions of sleep and avoid any distractions in the bedroom that may interfere with adequate sleep quality 7. Avoid sugar, avoid sweets as there is a strong link between excessive sugar intake, diabetes, and cognitive impairment The Mediterranean diet has been shown to help patients reduce the risk of progressive memory disorders and reduces cardiovascular risk. This includes eating fish, eat fruits and green leafy vegetables, nuts like almonds and hazelnuts, walnuts, and also use olive oil. Avoid fast foods and fried foods as much as possible. Avoid sweets and sugar as sugar use has been linked to worsening of memory function.  There is always a concern of gradual progression of memory problems. If this is the case, then we may need to adjust level of care according to patient needs. Support, both to the patient and caregiver, should then be put into place.

## 2019-12-24 NOTE — Telephone Encounter (Signed)
Sent rx in
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
## 2020-01-03 ENCOUNTER — Telehealth: Payer: Self-pay | Admitting: Neurology

## 2020-01-03 NOTE — Telephone Encounter (Signed)
Patient's daughter called in after getting a Jury Duty summons for the patient. She stated her mother would need a letter from Dr. Karel Jarvis explaining her disability and how that can affect her ability to serve on a jury. The letter mentioned a letter stating the patient cannot serve was not sufficient. They need details. The letter can be mailed to the patient's address.

## 2020-01-10 ENCOUNTER — Encounter (HOSPITAL_COMMUNITY): Payer: Self-pay | Admitting: Emergency Medicine

## 2020-01-10 ENCOUNTER — Inpatient Hospital Stay (HOSPITAL_COMMUNITY)
Admission: EM | Admit: 2020-01-10 | Discharge: 2020-01-11 | DRG: 917 | Disposition: A | Discharge status: Home Health | Payer: Medicare Other | Attending: Internal Medicine | Admitting: Internal Medicine

## 2020-01-10 ENCOUNTER — Emergency Department (HOSPITAL_COMMUNITY): Payer: Medicare Other

## 2020-01-10 DIAGNOSIS — I10 Essential (primary) hypertension: Secondary | ICD-10-CM | POA: Diagnosis present

## 2020-01-10 DIAGNOSIS — T50901A Poisoning by unspecified drugs, medicaments and biological substances, accidental (unintentional), initial encounter: Secondary | ICD-10-CM | POA: Diagnosis present

## 2020-01-10 DIAGNOSIS — I4891 Unspecified atrial fibrillation: Secondary | ICD-10-CM | POA: Diagnosis present

## 2020-01-10 DIAGNOSIS — T45521A Poisoning by antithrombotic drugs, accidental (unintentional), initial encounter: Secondary | ICD-10-CM | POA: Diagnosis present

## 2020-01-10 DIAGNOSIS — T441X1A Poisoning by other parasympathomimetics [cholinergics], accidental (unintentional), initial encounter: Secondary | ICD-10-CM | POA: Diagnosis present

## 2020-01-10 DIAGNOSIS — F0390 Unspecified dementia without behavioral disturbance: Secondary | ICD-10-CM | POA: Diagnosis present

## 2020-01-10 DIAGNOSIS — Z7901 Long term (current) use of anticoagulants: Secondary | ICD-10-CM | POA: Diagnosis not present

## 2020-01-10 DIAGNOSIS — I480 Paroxysmal atrial fibrillation: Secondary | ICD-10-CM

## 2020-01-10 DIAGNOSIS — Z20822 Contact with and (suspected) exposure to covid-19: Secondary | ICD-10-CM | POA: Diagnosis present

## 2020-01-10 DIAGNOSIS — E872 Acidosis: Secondary | ICD-10-CM | POA: Diagnosis present

## 2020-01-10 DIAGNOSIS — G92 Toxic encephalopathy: Secondary | ICD-10-CM | POA: Diagnosis present

## 2020-01-10 DIAGNOSIS — T391X1A Poisoning by 4-Aminophenol derivatives, accidental (unintentional), initial encounter: Secondary | ICD-10-CM | POA: Diagnosis present

## 2020-01-10 DIAGNOSIS — T461X1A Poisoning by calcium-channel blockers, accidental (unintentional), initial encounter: Principal | ICD-10-CM | POA: Diagnosis present

## 2020-01-10 DIAGNOSIS — R001 Bradycardia, unspecified: Secondary | ICD-10-CM | POA: Diagnosis not present

## 2020-01-10 DIAGNOSIS — T43591A Poisoning by other antipsychotics and neuroleptics, accidental (unintentional), initial encounter: Secondary | ICD-10-CM | POA: Diagnosis present

## 2020-01-10 DIAGNOSIS — T6594XA Toxic effect of unspecified substance, undetermined, initial encounter: Secondary | ICD-10-CM

## 2020-01-10 LAB — CBC WITH DIFFERENTIAL/PLATELET
Abs Immature Granulocytes: 0.01 10*3/uL (ref 0.00–0.07)
Basophils Absolute: 0.1 10*3/uL (ref 0.0–0.1)
Basophils Relative: 1 %
Eosinophils Absolute: 0.1 10*3/uL (ref 0.0–0.5)
Eosinophils Relative: 2 %
HCT: 37.2 % (ref 36.0–46.0)
Hemoglobin: 11.6 g/dL — ABNORMAL LOW (ref 12.0–15.0)
Immature Granulocytes: 0 %
Lymphocytes Relative: 19 %
Lymphs Abs: 0.7 10*3/uL (ref 0.7–4.0)
MCH: 29.8 pg (ref 26.0–34.0)
MCHC: 31.2 g/dL (ref 30.0–36.0)
MCV: 95.6 fL (ref 80.0–100.0)
Monocytes Absolute: 0.5 10*3/uL (ref 0.1–1.0)
Monocytes Relative: 14 %
Neutro Abs: 2.3 10*3/uL (ref 1.7–7.7)
Neutrophils Relative %: 64 %
Platelets: 207 10*3/uL (ref 150–400)
RBC: 3.89 MIL/uL (ref 3.87–5.11)
RDW: 13.9 % (ref 11.5–15.5)
WBC: 3.6 10*3/uL — ABNORMAL LOW (ref 4.0–10.5)
nRBC: 0 % (ref 0.0–0.2)

## 2020-01-10 LAB — COMPREHENSIVE METABOLIC PANEL
ALT: 12 U/L (ref 0–44)
AST: 16 U/L (ref 15–41)
Albumin: 3.9 g/dL (ref 3.5–5.0)
Alkaline Phosphatase: 89 U/L (ref 38–126)
Anion gap: 10 (ref 5–15)
BUN: 21 mg/dL (ref 8–23)
CO2: 21 mmol/L — ABNORMAL LOW (ref 22–32)
Calcium: 8.6 mg/dL — ABNORMAL LOW (ref 8.9–10.3)
Chloride: 107 mmol/L (ref 98–111)
Creatinine, Ser: 0.93 mg/dL (ref 0.44–1.00)
GFR calc Af Amer: >60 mL/min (ref >60)
GFR calc non Af Amer: >60 mL/min (ref >60)
Glucose, Bld: 139 mg/dL — ABNORMAL HIGH (ref 70–99)
Potassium: 4.1 mmol/L (ref 3.5–5.1)
Sodium: 138 mmol/L (ref 135–145)
Total Bilirubin: 1 mg/dL (ref 0.3–1.2)
Total Protein: 7.1 g/dL (ref 6.5–8.1)

## 2020-01-10 LAB — URINALYSIS, ROUTINE W REFLEX MICROSCOPIC
Bilirubin Urine: NEGATIVE
Glucose, UA: 50 mg/dL — AB
Hgb urine dipstick: NEGATIVE
Ketones, ur: 80 mg/dL — AB
Leukocytes,Ua: NEGATIVE
Nitrite: NEGATIVE
Protein, ur: NEGATIVE mg/dL
Specific Gravity, Urine: 1.014 (ref 1.005–1.030)
pH: 8 (ref 5.0–8.0)

## 2020-01-10 LAB — ETHANOL: Alcohol, Ethyl (B): <10 mg/dL (ref <10)

## 2020-01-10 LAB — SARS CORONAVIRUS 2 BY RT PCR (HOSPITAL ORDER, PERFORMED IN ~~LOC~~ HOSPITAL LAB): SARS Coronavirus 2: NEGATIVE

## 2020-01-10 LAB — PROTIME-INR
INR: 1.3 — ABNORMAL HIGH (ref 0.8–1.2)
Prothrombin Time: 15.3 seconds — ABNORMAL HIGH (ref 11.4–15.2)

## 2020-01-10 LAB — LIPASE, BLOOD: Lipase: 54 U/L — ABNORMAL HIGH (ref 11–51)

## 2020-01-10 LAB — SALICYLATE LEVEL: Salicylate Lvl: <7 mg/dL — ABNORMAL LOW (ref 7.0–30.0)

## 2020-01-10 LAB — RAPID URINE DRUG SCREEN, HOSP PERFORMED
Amphetamines: NOT DETECTED
Barbiturates: NOT DETECTED
Benzodiazepines: NOT DETECTED
Cocaine: NOT DETECTED
Opiates: NOT DETECTED
Tetrahydrocannabinol: NOT DETECTED

## 2020-01-10 LAB — CBG MONITORING, ED: Glucose-Capillary: 133 mg/dL — ABNORMAL HIGH (ref 70–99)

## 2020-01-10 LAB — MAGNESIUM: Magnesium: 2.2 mg/dL (ref 1.7–2.4)

## 2020-01-10 LAB — MRSA PCR SCREENING: MRSA by PCR: NEGATIVE

## 2020-01-10 LAB — ACETAMINOPHEN LEVEL: Acetaminophen (Tylenol), Serum: 16 ug/mL (ref 10–30)

## 2020-01-10 LAB — AMMONIA: Ammonia: 18 umol/L (ref 9–35)

## 2020-01-10 MED ORDER — POTASSIUM CHLORIDE IN NACL 40-0.9 MEQ/L-% IV SOLN
INTRAVENOUS | Status: AC
  Administered 2020-01-10: 50 mL/h via INTRAVENOUS
  Filled 2020-01-10 (×2): qty 1000

## 2020-01-10 MED ORDER — ACETYLCYSTEINE LOAD VIA INFUSION: 150.0000 mg/kg | Freq: Once | INTRAVENOUS | Status: DC

## 2020-01-10 MED ORDER — DEXTROSE 5 % IV SOLN
15.0000 mg/kg/h | INTRAVENOUS | Status: DC
  Administered 2020-01-10 – 2020-01-11 (×2): 15 mg/kg/h via INTRAVENOUS
  Filled 2020-01-10: qty 120
  Filled 2020-01-10: qty 90
  Filled 2020-01-10: qty 120

## 2020-01-10 MED ORDER — ACETYLCYSTEINE LOAD VIA INFUSION
150.0000 mg/kg | Freq: Once | INTRAVENOUS | Status: AC
  Administered 2020-01-10: 9180 mg via INTRAVENOUS
  Filled 2020-01-10: qty 230

## 2020-01-10 MED ORDER — POTASSIUM CHLORIDE IN NACL 20-0.9 MEQ/L-% IV SOLN
INTRAVENOUS | Status: DC
  Filled 2020-01-10: qty 1000

## 2020-01-10 MED ORDER — SODIUM CHLORIDE 0.9 % IV SOLN: INTRAVENOUS | Status: DC

## 2020-01-10 MED ORDER — SODIUM CHLORIDE 0.9 % IV SOLN
INTRAVENOUS | Status: DC | PRN
  Administered 2020-01-10: 250 mL via INTRAVENOUS

## 2020-01-10 NOTE — ED Notes (Signed)
Patient transported to CT 
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
## 2020-01-10 NOTE — ED Provider Notes (Signed)
Medical screening examination/treatment/procedure(s) were conducted as a shared visit with non-physician practitioner(s) and myself.  I personally evaluated the patient during the encounter.  EKG Interpretation  Date/Time:  Monday Jan 10 2020 13:09:39 EDT Ventricular Rate:  59 PR Interval:    QRS Duration: 111 QT Interval:  508 QTC Calculation: 504 R Axis:   91 Text Interpretation: Sinus rhythm Right axis deviation Consider left ventricular hypertrophy Prolonged QT interval agree, similar to older tracing with slower rate and QT prolongation Confirmed by Arby Barrette [phone]) on 01/10/2020 1:20:45 PM  Patient lives alone but does have history of dementia.  Her medications and meals are carefully timed with a dispenser.  Yesterday evening the patient broke her dispenser and many of the pills are missing.  The PA-C note for quantities.  Patient denies that she took the medications although they are missing and her daughter has high suspicion that she took the pills.  Patient's daughter reports that they found her sitting on the floor with some pills spilled around her and having vomited several times.  Patient endorses that she still feels nauseated but otherwise denies any complaints of pain.  Patient is alert.  She is in no acute distress.  She is pleasant and answers questions easily.  No respiratory distress.  Heart regular.  Lungs grossly clear.  Abdomen soft and nontender.  Patient has chronic peripheral edema changes of venous stasis.  At this time her daughter reports this is better than usual.  Patient will follow commands without difficulty.  I suspicion that patient ingested polypharmacy overdose including potentially very high dose of acetaminophen and Seroquel.  Per consultation with poison control patient will be started on NAC.  Patient will be need admission for ongoing observation and management.     Arby Barrette, MD 01/10/20 1328

## 2020-01-10 NOTE — ED Notes (Signed)
Lab notified to add-on protime-inr for blue top already submitted.

## 2020-01-10 NOTE — ED Notes (Signed)
Per 2W Charge RN request, pt will be held in ED until bed approved on unit due to lack of bed availability at time report was given.  Will continue to monitor.

## 2020-01-10 NOTE — ED Triage Notes (Signed)
Pt's daughter states that when she went to her mother's house this morning her pill container was on the floor and only containing very few medications. This happened between 7pm last night and 10am today. Daughter reports that her mother has vomited couple times.

## 2020-01-10 NOTE — Progress Notes (Signed)
This RN placed patient's life alert necklace in patient's belonging bag.

## 2020-01-10 NOTE — ED Notes (Signed)
HCPOA at bedside 

## 2020-01-10 NOTE — Progress Notes (Addendum)
Pharmacy Brief Note:   Pharmacy consulted to assist with N-Acetylcysteine dosing/monitoring for potential APAP overdose.  Assessment: Pt had possible ingestion between 5/30 at 7:00 pm and 5/31 at 10:00 am per ED provider note. Case opened with [facility], N-AC IV started.  Labs obtained @ 1113 on 5/31:   Acetaminophen level = 16   AST/ALT: 16/12  Called in lab results to [facility]. They reported that since APAP <25 and AST/ALT < 2x ULN; patient did not require treatment with N-AC despite unknown ingestion time.   Plan:  Per discussion with MD, will continue N-AC infusion at 150 mg/kg LD followed by 15 mg/kg/hr  Will check APAP level and LFTs in 22 hours  Cindi Carbon, PharmD 01/10/20 1:44 PM

## 2020-01-10 NOTE — ED Notes (Signed)
Pt denies SI/HI/AVH at this time.  Will continue to monitor.

## 2020-01-10 NOTE — ED Notes (Signed)
Bernice from [employer] updated on pts status, lab results, ekg results.  PC will continue to monitor.

## 2020-01-10 NOTE — ED Notes (Signed)
X-ray at bedside

## 2020-01-10 NOTE — ED Notes (Signed)
ED Provider at bedside. 
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
## 2020-01-11 ENCOUNTER — Telehealth: Payer: Self-pay | Admitting: Neurology

## 2020-01-11 ENCOUNTER — Other Ambulatory Visit: Payer: Self-pay

## 2020-01-11 DIAGNOSIS — T50901A Poisoning by unspecified drugs, medicaments and biological substances, accidental (unintentional), initial encounter: Secondary | ICD-10-CM

## 2020-01-11 LAB — MAGNESIUM: Magnesium: 2.1 mg/dL (ref 1.7–2.4)

## 2020-01-11 LAB — COMPREHENSIVE METABOLIC PANEL
ALT: 17 U/L (ref 0–44)
ALT: 16 U/L (ref 0–44)
AST: 19 U/L (ref 15–41)
AST: 19 U/L (ref 15–41)
Albumin: 3.5 g/dL (ref 3.5–5.0)
Albumin: 3.8 g/dL (ref 3.5–5.0)
Alkaline Phosphatase: 73 U/L (ref 38–126)
Alkaline Phosphatase: 79 U/L (ref 38–126)
Anion gap: 12 (ref 5–15)
Anion gap: 11 (ref 5–15)
BUN: 14 mg/dL (ref 8–23)
BUN: 13 mg/dL (ref 8–23)
CO2: 21 mmol/L — ABNORMAL LOW (ref 22–32)
CO2: 22 mmol/L (ref 22–32)
Calcium: 8.9 mg/dL (ref 8.9–10.3)
Calcium: 9 mg/dL (ref 8.9–10.3)
Chloride: 109 mmol/L (ref 98–111)
Chloride: 109 mmol/L (ref 98–111)
Creatinine, Ser: 0.84 mg/dL (ref 0.44–1.00)
Creatinine, Ser: 0.86 mg/dL (ref 0.44–1.00)
GFR calc Af Amer: >60 mL/min (ref >60)
GFR calc Af Amer: >60 mL/min (ref >60)
GFR calc non Af Amer: >60 mL/min (ref >60)
GFR calc non Af Amer: >60 mL/min (ref >60)
Glucose, Bld: 88 mg/dL (ref 70–99)
Glucose, Bld: 100 mg/dL — ABNORMAL HIGH (ref 70–99)
Potassium: 4.2 mmol/L (ref 3.5–5.1)
Potassium: 3.7 mmol/L (ref 3.5–5.1)
Sodium: 142 mmol/L (ref 135–145)
Sodium: 142 mmol/L (ref 135–145)
Total Bilirubin: 1.6 mg/dL — ABNORMAL HIGH (ref 0.3–1.2)
Total Bilirubin: 1.8 mg/dL — ABNORMAL HIGH (ref 0.3–1.2)
Total Protein: 6.7 g/dL (ref 6.5–8.1)
Total Protein: 7.2 g/dL (ref 6.5–8.1)

## 2020-01-11 LAB — CBC
HCT: 40.6 % (ref 36.0–46.0)
Hemoglobin: 12.6 g/dL (ref 12.0–15.0)
MCH: 30.1 pg (ref 26.0–34.0)
MCHC: 31 g/dL (ref 30.0–36.0)
MCV: 96.9 fL (ref 80.0–100.0)
Platelets: 188 10*3/uL (ref 150–400)
RBC: 4.19 MIL/uL (ref 3.87–5.11)
RDW: 13.7 % (ref 11.5–15.5)
WBC: 5.8 10*3/uL (ref 4.0–10.5)
nRBC: 0 % (ref 0.0–0.2)

## 2020-01-11 LAB — PROTIME-INR
INR: 1.1 (ref 0.8–1.2)
Prothrombin Time: 14.2 seconds (ref 11.4–15.2)

## 2020-01-11 LAB — ACETAMINOPHEN LEVEL: Acetaminophen (Tylenol), Serum: <10 ug/mL — ABNORMAL LOW (ref 10–30)

## 2020-01-11 MED ORDER — OLANZAPINE 10 MG IM SOLR
5.0000 mg | Freq: Once | INTRAMUSCULAR | Status: AC
  Administered 2020-01-11: 5 mg via INTRAMUSCULAR
  Filled 2020-01-11: qty 10

## 2020-01-11 MED ORDER — APIXABAN 5 MG PO TABS: 5.0000 mg | ORAL_TABLET | Freq: Two times a day (BID) | ORAL | Status: DC

## 2020-01-11 MED ORDER — STERILE WATER FOR INJECTION IJ SOLN
INTRAMUSCULAR | Status: AC
  Administered 2020-01-11: 10 mL
  Filled 2020-01-11: qty 10

## 2020-01-11 MED ORDER — HALOPERIDOL LACTATE 5 MG/ML IJ SOLN: 1.0000 mg | Freq: Four times a day (QID) | INTRAMUSCULAR | Status: DC | PRN

## 2020-01-11 MED ORDER — DONEPEZIL HCL 10 MG PO TABS: 10.0000 mg | ORAL_TABLET | Freq: Every day | ORAL | Status: DC

## 2020-01-11 MED ORDER — CHLORHEXIDINE GLUCONATE CLOTH 2 % EX PADS
6.0000 | MEDICATED_PAD | Freq: Every day | CUTANEOUS | Status: DC
  Administered 2020-01-11: 6 via TOPICAL

## 2020-01-11 MED ORDER — QUETIAPINE FUMARATE 50 MG PO TABS: 25.0000 mg | ORAL_TABLET | Freq: Every day | ORAL | Status: DC

## 2020-01-11 MED ORDER — DILTIAZEM HCL ER COATED BEADS 240 MG PO CP24: 240.0000 mg | ORAL_CAPSULE | Freq: Every day | ORAL | 0 refills | Status: AC

## 2020-01-11 NOTE — Discharge Summary (Signed)
Physician Discharge Summary  [name] ZOX:[id number] DOB: 08/23/1945 DOA: 01/10/2020  PCP: Shon Haleimberlake, Kathryn S, MD  Admit date: 01/10/2020 Discharge date: 01/11/2020  Admitted From: home Disposition:  Home W HHC  Recommendations for Outpatient Follow-up:  1. Follow up with PCP in 1-2 weeks 2. Please obtain BMP/CBC in one week 3. Please follow up on the following pending results:  Home Health: Yes  Equipment/Devices: None  Discharge Condition: Stable Code Status: Full Diet recommendation:  Diet Order            Diet - low sodium heart healthy        Diet Heart Room service appropriate? Yes; Fluid consistency: Thin  Diet effective now               Brief/Interim Summary: Brief Narrative 74 year old female with history of A. fib, dementia, cognitive communication deficits, hypertension brought to the ED by caregiver with altered mental status and concern for medication ingestion/overdose.  Patient is caregiver 5 days a week until 7 PM, POA went to check on the patient and found to be confused and altered not at baseline and pills missing from multiple bottles overall had 6 tabs of cardizem 240 mg and 22 Tylenol 500 mg tablets, and 6 donepezil tablets of 10 mg, and 5 Seroquel tablets of 25 mg, and 10 Eliquis tablets of 5 mg, given the patient is awake alert x3, but she does appear to be quite confused with diminished cognition and insight have been missing. In the ED LFTs normal Tylenol level at 16 initial her bradycardia with heart rate in mid 40s CT head no acute finding, INR 1.3.  Poison control was discussed and placed on NAC and admitted. Next day her labs are stable, tele stable.  Discharge Diagnoses:  Assessment & Plan: Unintentional overdose with multiple meds suspected: Cardizem, Tylenol, donepezil, Seroquel, Eliquis. Poision controlled reviewed her labs ,ekg and Vitals and signed off thsi am. Pt is back to baseline tylenol level, fts stable. She was monitored  overnight: For Tylenol overdose: on NAC protocol managed by pharmacy,Tylenol level was 16 and this am < 10, INR at 1.1 normal,LFTs normal AST ALT, total bili slightly 1.8. Per pharmacy/poison control stopped NAC now. LFT from pcp in 2-3 days. For Seroquel overdose QTC prolonged  504 on admission, repeat QTC- improved.resume Seroquel and f/u with neurology. -For Cardizem overdose heart rate stable this morning overnight intermittently bradycardic. BP stable.  Vitals overall stable all day today.  Hold Cardizem for next 24/ -For Donepezil overdose tele stable.Was recently placed and dose was slowly increased. ? Dementia causing some of worsening/insomina at home. Caregiver to contact her neurology regarding resuming meds. -For Eliquis overdose, INR improved this morning. Resume eliquis and pcp to assess safety of anticoagulation in this patient.  Acute encephalopathy likely toxic, medication induced-resolved.Baseline cognitive impairment.  CT head unremarkable.  No evidence of UTI.  Mental status improved.  With baseline dementia.  Patient was somewhat agitated this afternoon but after patient's caregiver POA arrived she calmed down. Offered monitoring here overnight but patient's caregiver POA worried that patient mental status agitation will probably get worse in unfamiliar environment/hospital with unfamiliar faces and they prefer to take her home today with her private sitter. Patient is alert awake oriented with back to baseline after caregiver at friends arrived to her room Patient is being transition to memory care unit and this was in the process for several days now.  Social worker consulted here to help.  Solitary kidney-renal function  is stable.  Monitor.  Urine output very good. Hx of Atrial fibrillation: Overnight sinus bradycardia.  Cardizem on hold, resume Eliquis.    Sinus bradycardia overnight in 30s transient.  Heart rate in the 70s stable improved in 70s.  Sinus.  EKG  stable  Dementia: Was recently placed on Aricept and advised follow-up with her neurologist for ongoing recommendation whether to resume or discontinue  Metabolic acidosis bicarb normalized.   Patient was admitted under inpatient status however patient has improved back to baseline- psosion controle signed off.tele stable. ekg nsr and qtc normal.Given that patient may get worse while she is hospitalized in unfamiliar environment with agitation POA prefers to take her home tonight with close monitoring and private sitter at home and transition to assist memory care unit soon and case manager was contacted who is going to review the chart and contact the POA tomorrow, will set up HHC also and CM to help with the transition to memory care unit.  Consults:  Poison Control  Subjective: Patient was somewhat agitated this afternoon however after arrival of patient's caregiver and sitter she is alert awake calm down. This morning she is alert awake oriented to self, current year but has baseline dementia unable to tell me why she is in the hospital appears pleasant.  Asking if she can go home today. Overnight intermittent sinus bradycardia-transient Heart rate has been well today. Poison control signed off\  Discharge Exam: Vitals:   01/11/20 1200 01/11/20 1338  BP:  (!) 146/81  Pulse:  74  Resp:  17  Temp: 98.3 F (36.8 C)   SpO2:  100%   Examination:  General exam: AAO to self, year/current president ,NAD. HEENT:Oral mucosa moist, Ear/Nose WNL grossly,dentition normal. Respiratory system: bilaterally clear,no wheezing or crackles,no use of accessory muscle, non tender. Cardiovascular system: S1 & S2 +, regular, No JVD. Gastrointestinal system: Abdomen soft, NT,ND, BS+. Nervous System:Alert, awake, moving extremities and grossly nonfocal Extremities: No edema, distal peripheral pulses palpable.  Skin: No rashes,no icterus. MSK: Normal muscle bulk,tone, power  Discharge  Instructions  Discharge Instructions    Diet - low sodium heart healthy   Complete by: As directed    Discharge instructions   Complete by: As directed    Please contact your neurology regarding Aricept meds resumption. Hold cardizem tonight-Check BP and Herat rate before using cardizem- if bp <100/70 mm hg hold cardizem and if HR <60/MIN HOLD CARDIZEM. Please check cbc/cmp from pcp in 2-3 days. Please arrange safety/one to one precautions at home, ensure safety and administer meds with super [facility] and ensure pills are not left for her to take by herself. And f/u with CM, PCP soon to transition to Memory care unit.   Increase activity slowly   Complete by: As directed      Allergies as of 01/11/2020      Reactions   Penicillins    Did it involve swelling of the face/tongue/throat, SOB, or low BP? Unknown Did it involve sudden or severe rash/hives, skin peeling, or any reaction on the inside of your mouth or nose? Unknown Did you need to seek medical attention at a hospital or doctor's office? Unknown When did it last happen?childhood reaction If all above answers are "NO", may proceed with cephalosporin use.      Medication List    STOP taking these medications   acetaminophen 500 MG tablet Commonly known as: TYLENOL     TAKE these medications   diltiazem 240 MG 24 hr capsule  Commonly known as: CARDIZEM CD Take 1 capsule (240 mg total) by mouth daily. Start taking on: January 12, 2020   donepezil 10 MG tablet Commonly known as: ARICEPT Take 1/2 tablet daily for 2 weeks, then increase to 1 tablet daily What changed:   how much to take  how to take this  when to take this  additional instructions   Eliquis 5 MG Tabs tablet Generic drug: apixaban TAKE 1 TABLET BY MOUTH TWICE A DAY What changed: how much to take   QUEtiapine 25 MG tablet Commonly known as: SEROQUEL Take 1 tablet (25 mg total) by mouth at bedtime.      Follow-up Information    Shon Hale, MD Follow up in 1 day(s).   Specialty: Family Medicine Contact information: [address] [place] [address] [phone]        Jake Bathe, MD .   Specialty: Cardiology Contact information: [id number] [address] [place] [address] [phone]          Allergies  Allergen Reactions  . Penicillins     Did it involve swelling of the face/tongue/throat, SOB, or low BP? Unknown Did it involve sudden or severe rash/hives, skin peeling, or any reaction on the inside of your mouth or nose? Unknown Did you need to seek medical attention at a hospital or doctor's office? Unknown When did it last happen?childhood reaction If all above answers are "NO", may proceed with cephalosporin use.     The results of significant diagnostics from this hospitalization (including imaging, microbiology, ancillary and laboratory) are listed below for reference.    Microbiology: Recent Results (from the past 240 hour(s))  SARS Coronavirus 2 by RT PCR (hospital order, performed in [facility] hospital lab) Nasopharyngeal Nasopharyngeal Swab     Status: None   Collection Time: 01/10/20  1:20 PM   Specimen: Nasopharyngeal Swab  Result Value Ref Range Status   SARS Coronavirus 2 NEGATIVE NEGATIVE Final    Comment: (NOTE) SARS-CoV-2 target nucleic acids are NOT DETECTED. The SARS-CoV-2 RNA is generally detectable in upper and lower respiratory specimens during the acute phase of infection. The lowest concentration of SARS-CoV-2 viral copies this assay can detect is 250 copies / mL. A negative result does not preclude SARS-CoV-2 infection and should not be used as the sole basis for treatment or other patient management decisions.  A negative result may occur with improper specimen collection / handling, submission of specimen other than nasopharyngeal swab, presence of viral mutation(s) within the areas targeted by this assay, and inadequate number of  viral copies (<250 copies / mL). A negative result must be combined with clinical observations, patient history, and epidemiological information. Fact Sheet for Patients:   BoilerBrush.com.cy Fact Sheet for Healthcare Providers: https://pope.com/ This test is not yet approved or cleared  by the [place] FDA and has been authorized for detection and/or diagnosis of SARS-CoV-2 by FDA under an Emergency Use Authorization (EUA).  This EUA will remain in effect (meaning this test can be used) for the duration of the COVID-19 declaration under Section 564(b)(1) of the Act, 21 U.S.C. section 360bbb-3(b)(1), unless the authorization is terminated or revoked sooner. Performed at [facility], [address], [place], [place] [address]   MRSA PCR Screening     Status: None   Collection Time: 01/10/20  4:41 PM   Specimen: Nasal Mucosa; Nasopharyngeal  Result Value Ref Range Status   MRSA by PCR NEGATIVE NEGATIVE Final  Comment:        The GeneXpert MRSA Assay (FDA approved for NASAL specimens only), is one component of a comprehensive MRSA colonization surveillance program. It is not intended to diagnose MRSA infection nor to guide or monitor treatment for MRSA infections. Performed at [facility], [address] [place], [place] [address]     Procedures/Studies: DG Chest 1 View  Result Date: 01/10/2020 CLINICAL DATA:  Concern for drug overdose EXAM: CHEST  1 VIEW COMPARISON:  August 05, 2013 FINDINGS: Lungs are clear. Heart size and pulmonary vascularity are normal. No adenopathy. No bone lesions. IMPRESSION: Lungs clear. Cardiac silhouette within normal limits. No evident adenopathy. Electronically Signed   By: Bretta Bang III M.D.   On: 01/10/2020 11:36   CT HEAD WO CONTRAST  Result Date: 01/10/2020 CLINICAL DATA:  Encephalopathy, vomiting and history of dementia. EXAM: CT HEAD  WITHOUT CONTRAST TECHNIQUE: Contiguous axial images were obtained from the base of the skull through the vertex without intravenous contrast. COMPARISON:  08/01/2013 FINDINGS: Brain: There is some progression of cortical atrophy since the prior study. Relatively stable small vessel disease of the periventricular white matter. No acute hemorrhage, mass effect, visible cortical infarction, extra-axial fluid collections or hydrocephalus. No abnormal edema identified. Vascular: No hyperdense vessel or unexpected calcification. Skull: Normal. Negative for fracture or focal lesion. Sinuses/Orbits: No acute finding. Other: None. IMPRESSION: No acute findings. Progressive cortical atrophy and relatively stable small vessel disease. Electronically Signed   By: Irish Lack M.D.   On: 01/10/2020 12:08    Labs: BNP (last 3 results) No results for input(s): BNP in the last 8760 hours. Basic Metabolic Panel: Recent Labs  Lab 01/10/20 1113 01/11/20 0252 01/11/20 0945  NA 138 142 142  K 4.1 4.2 3.7  CL 107 109 109  CO2 21* 21* 22  GLUCOSE 139* 88 100*  BUN 21 14 13   CREATININE 0.93 0.84 0.86  CALCIUM 8.6* 8.9 9.0  MG 2.2 2.1  --    Liver Function Tests: Recent Labs  Lab 01/10/20 1113 01/11/20 0252 01/11/20 0945  AST 16 19 19   ALT 12 17 16   ALKPHOS 89 73 79  BILITOT 1.0 1.6* 1.8*  PROT 7.1 6.7 7.2  ALBUMIN 3.9 3.5 3.8   Recent Labs  Lab 01/10/20 1113  LIPASE 54*   Recent Labs  Lab 01/10/20 1116  AMMONIA 18   CBC: Recent Labs  Lab 01/10/20 1113 01/11/20 0252  WBC 3.6* 5.8  NEUTROABS 2.3  --   HGB 11.6* 12.6  HCT 37.2 40.6  MCV 95.6 96.9  PLT 207 188   Cardiac Enzymes: No results for input(s): CKTOTAL, CKMB, CKMBINDEX, TROPONINI in the last 168 hours. BNP: Invalid input(s): POCBNP CBG: Recent Labs  Lab 01/10/20 1119  GLUCAP 133*   D-Dimer No results for input(s): DDIMER in the last 72 hours. Hgb A1c No results for input(s): HGBA1C in the last 72 hours. Lipid  Profile No results for input(s): CHOL, HDL, LDLCALC, TRIG, CHOLHDL, LDLDIRECT in the last 72 hours. Thyroid function studies No results for input(s): TSH, T4TOTAL, T3FREE, THYROIDAB in the last 72 hours.  Invalid input(s): FREET3 Anemia work up No results for input(s): VITAMINB12, FOLATE, FERRITIN, TIBC, IRON, RETICCTPCT in the last 72 hours. Urinalysis    Component Value Date/Time   COLORURINE STRAW (A) 01/10/2020 1519   APPEARANCEUR CLEAR 01/10/2020 1519   LABSPEC 1.014 01/10/2020 1519   PHURINE 8.0 01/10/2020 1519   GLUCOSEU 50 (A) 01/10/2020 1519   HGBUR  NEGATIVE 01/10/2020 1519   BILIRUBINUR NEGATIVE 01/10/2020 1519   KETONESUR 80 (A) 01/10/2020 1519   PROTEINUR NEGATIVE 01/10/2020 1519   UROBILINOGEN 2.0 (H) 10/14/2013 1209   NITRITE NEGATIVE 01/10/2020 1519   LEUKOCYTESUR NEGATIVE 01/10/2020 1519   Sepsis Labs Invalid input(s): PROCALCITONIN,  WBC,  LACTICIDVEN Microbiology Recent Results (from the past 240 hour(s))  SARS Coronavirus 2 by RT PCR (hospital order, performed in [facility] hospital lab) Nasopharyngeal Nasopharyngeal Swab     Status: None   Collection Time: 01/10/20  1:20 PM   Specimen: Nasopharyngeal Swab  Result Value Ref Range Status   SARS Coronavirus 2 NEGATIVE NEGATIVE Final    Comment: (NOTE) SARS-CoV-2 target nucleic acids are NOT DETECTED. The SARS-CoV-2 RNA is generally detectable in upper and lower respiratory specimens during the acute phase of infection. The lowest concentration of SARS-CoV-2 viral copies this assay can detect is 250 copies / mL. A negative result does not preclude SARS-CoV-2 infection and should not be used as the sole basis for treatment or other patient management decisions.  A negative result may occur with improper specimen collection / handling, submission of specimen other than nasopharyngeal swab, presence of viral mutation(s) within the areas targeted by this assay, and inadequate number of viral copies (<250  copies / mL). A negative result must be combined with clinical observations, patient history, and epidemiological information. Fact Sheet for Patients:   StrictlyIdeas.no Fact Sheet for Healthcare Providers: BankingDealers.co.za This test is not yet approved or cleared  by the [place] FDA and has been authorized for detection and/or diagnosis of SARS-CoV-2 by FDA under an Emergency Use Authorization (EUA).  This EUA will remain in effect (meaning this test can be used) for the duration of the COVID-19 declaration under Section 564(b)(1) of the Act, 21 U.S.C. section 360bbb-3(b)(1), unless the authorization is terminated or revoked sooner. Performed at [facility], [place] [address] [place], [place] [address]   MRSA PCR Screening     Status: None   Collection Time: 01/10/20  4:41 PM   Specimen: Nasal Mucosa; Nasopharyngeal  Result Value Ref Range Status   MRSA by PCR NEGATIVE NEGATIVE Final    Comment:        The GeneXpert MRSA Assay (FDA approved for NASAL specimens only), is one component of a comprehensive MRSA colonization surveillance program. It is not intended to diagnose MRSA infection nor to guide or monitor treatment for MRSA infections. Performed at [facility], [place] [address], [place], [place] [address]      Time coordinating discharge: 25  minutes  SIGNED: Antonieta Pert, MD  [facility] 01/11/2020, 5:04 PM  If 7PM-7AM, please contact night-coverage [email or web address]

## 2020-01-11 NOTE — Telephone Encounter (Signed)
Patient's daughter called to inform Dr. Karel Jarvis that the patient was admitted to the ED because she broke into her pill box and took 10 days worth of medication. She is due to be discharged today. Daughter wants to know if you may want to increase the Aricept?  [facility] [address]

## 2020-01-11 NOTE — TOC Initial Note (Signed)
Transition of Care [facility]) - Initial/Assessment Note    Patient Details  Name: [name] MRN: [id number] Date of Birth: September 24, 1945  Transition of Care [facility]) CM/SW Contact:    Leeroy Cha, RN Phone Number: 01/11/2020, 9:12 AM  Clinical Narrative:                 Lives by herself at the present time with help from her legal guardian. Admitted due to a.fib iv acetadote started. Hx of dementia may be need placement in memory care vs snf.  Will see what pt evaluation states.  Expected Discharge Plan: Memory Care Barriers to Discharge: Continued Medical Work up   Patient Goals and CMS Choice Patient states their goals for this hospitalization and ongoing recovery are:: poa-legal guardian memory care versus snf CMS Medicare.gov Compare Post Acute Care list provided to:: Legal Guardian Choice offered to / list presented to : [facility] POA / Guardian  Expected Discharge Plan and Services Expected Discharge Plan: Memory Care   Discharge Planning Services: CM Consult   Living arrangements for the past 2 months: Single Family Home                                      Prior Living Arrangements/Services Living arrangements for the past 2 months: Single Family Home Lives with:: Self Patient language and need for interpreter reviewed:: No Do you feel safe going back to the place where you live?: No   poa thinks patient will need memory care or snf  Need for Family Participation in Patient Care: Yes (Comment) Care giver support system in place?: Yes (comment)   Criminal Activity/Legal Involvement Pertinent to Current Situation/Hospitalization: No - Comment as needed  Activities of Daily Living Home Assistive Devices/Equipment: None ADL Screening (condition at time of admission) Patient's cognitive ability adequate to safely complete daily activities?: No Is the patient deaf or have difficulty hearing?: No Does the patient have difficulty seeing, even when wearing  glasses/contacts?: No Does the patient have difficulty concentrating, remembering, or making decisions?: Yes Patient able to express need for assistance with ADLs?: Yes Does the patient have difficulty dressing or bathing?: No Independently performs ADLs?: No Does the patient have difficulty walking or climbing stairs?: No Weakness of Legs: None Weakness of Arms/Hands: None  Permission Sought/Granted                  Emotional Assessment Appearance:: Appears older than stated age Attitude/Demeanor/Rapport: Other (comment)(has dementia) Affect (typically observed): Calm Orientation: : Oriented to Self, Oriented to Situation Alcohol / Substance Use: Not Applicable Psych Involvement: No (comment)  Admission diagnosis:  Drug ingestion, accidental or unintentional, initial encounter [T50.901A] Ingestion of substance, undetermined intent, initial encounter [T65.94XA] Patient Active Problem List   Diagnosis Date Noted  . Dementia ([place]) 01/10/2020  . Drug ingestion, accidental or unintentional, initial encounter 01/10/2020  . 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 Neuropathy 04/13/2019  . Faintness 05/30/2015  . Chronic anticoagulation 05/16/2014  . Hematuria, gross 10/14/2013  . UTI (urinary tract infection) 10/14/2013  . Generalized weakness 08/23/2013  . Fracture of multiple ribs 08/23/2013  . PAF (paroxysmal atrial fibrillation) ([place]) 08/16/2013  . C3 cervical fracture ([place]) 08/04/2013  . 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  . Sternal fracture 08/04/2013  . Atrial fibrillation ([place]) 08/04/2013  . Acute blood loss anemia 08/04/2013  . Acute respiratory failure ([place]) 08/04/2013  .  Lower extremity neuropathy 08/04/2013  . Traumatic right hemopneumothorax 08/04/2013  . Thrombocytopenia ([facility]) 08/04/2013   PCP:  Shon Hale, MD [facility] [address] [place] [address] [place] [address] Phone: [phone] Fax: [phone]     Social Determinants of Health (SDOH) Interventions    Readmission Risk Interventions No flowsheet data found.

## 2020-01-11 NOTE — Evaluation (Signed)
Physical Therapy Evaluation Patient Details Name: [name] MRN: [id number] DOB: 1946-06-26 Today's Date: 01/11/2020   History of Present Illness  Pt is 74 yo female with PMH of afib, dementia, cognitive deficits, and HTN.  She presented to ED after concern of medication ingestion unintentional OD (took all of her pills for the week).  Clinical Impression  Pt admitted with above diagnosis. Pt requiring min A for all transfers and with poor safety awareness.  Required frequent cues for transfer techniques and safety.  Pt very pleasant but confused.  No family/friends present at this time to determine PLOF ; however, per reports pt seems to be near baseline.  Report is that [facility] is trying to get pt in memory care unit.  Pt is not safe to return home without full 24 hr care - recommend SNF if 24 hr care not available while [facility]CP[facility] seeks out memory care.  Pt currently with functional limitations due to the deficits listed below (see PT Problem List). Pt will benefit from skilled PT to increase their independence and safety with mobility to allow discharge to the venue listed below.       Follow Up Recommendations Supervision/Assistance - 24 hour;SNF(transition to memory care)    Equipment Recommendations  Rolling walker with 5" wheels;3in1 (PT)(unreliable historian - unsure of DME at home)    Recommendations for Other Services       Precautions / Restrictions Precautions Precautions: Fall      Mobility  Bed Mobility Overal bed mobility: Needs Assistance Bed Mobility: Supine to Sit;Sit to Supine     Supine to sit: Min assist Sit to supine: Min assist      Transfers Overall transfer level: Needs assistance Equipment used: Rolling walker (2 wheeled) Transfers: Sit to/from Stand Sit to Stand: Min assist         General transfer comment: sit to stand from bed and low toielt with min A and cues for safe hand placement  Ambulation/Gait Ambulation/Gait assistance: Min assist Gait  Distance (Feet): 80 Feet Assistive device: Rolling walker (2 wheeled) Gait Pattern/deviations: Step-through pattern;Wide base of support;Drifts right/left Gait velocity: normal   General Gait Details: unsteady; impulsive; L foot with toe out and occasionally hits RW requring assist for balance; cued for posture and RW  Stairs            Wheelchair Mobility    Modified Rankin (Stroke Patients Only)       Balance Overall balance assessment: Needs assistance Sitting-balance support: No upper extremity supported;Feet supported Sitting balance-Leahy Scale: Good     Standing balance support: Bilateral upper extremity supported;During functional activity Standing balance-Leahy Scale: Fair Standing balance comment: reliant on RW                             Pertinent Vitals/Pain Pain Assessment: No/denies pain    Home Living Family/patient expects to be discharged to:: Unsure Living Arrangements: Alone(Pt lives alone but has cargiver 5 days a week during the day.) Available Help at Discharge: Friend(s);Available PRN/intermittently         Home Layout: One level Home Equipment: Cane - single point      Prior Function           Comments: Pt unable to provide reliable home environment or PLOF.  Per chart and RN, pt was at home with caregivers during the day.  She ambulated with a cane and has a cat named Alexander at home.  RN reports [facility] is looking at getting pt into a memory unit ASAP.     Hand Dominance        Extremity/Trunk Assessment   Upper Extremity Assessment Upper Extremity Assessment: Overall WFL for tasks assessed    Lower Extremity Assessment Lower Extremity Assessment: Overall WFL for tasks assessed    Cervical / Trunk Assessment Cervical / Trunk Assessment: Kyphotic  Communication   Communication: No difficulties  Cognition Arousal/Alertness: Awake/alert Behavior During Therapy: WFL for tasks assessed/performed Overall  Cognitive Status: History of cognitive impairments - at baseline                                 General Comments: Pt extremely pleasant but with very short term memory, unable to provide PLOF, oriented to self  only (able to state at hospital initially but then asking to go sit in her recliner in living room), decreased safety awarenss and awareness of deficits      General Comments General comments (skin integrity, edema, etc.): Required min  A with toielting ADLs    Exercises     Assessment/Plan    PT Assessment Patient needs continued PT services  PT Problem List Decreased strength;Decreased mobility;Decreased safety awareness;Decreased range of motion;Decreased knowledge of precautions;Decreased activity tolerance;Decreased balance;Decreased knowledge of use of DME       PT Treatment Interventions DME instruction;Therapeutic activities;Gait training;Therapeutic exercise;Patient/family education;Functional mobility training;Balance training    PT Goals (Current goals can be found in the Care Plan section)  Acute Rehab PT Goals Patient Stated Goal: return home to cat; RN reports POA looking into memory care PT Goal Formulation: With patient Time For Goal Achievement: 01/25/20 Potential to Achieve Goals: Fair    Frequency Min 3X/week   Barriers to discharge Decreased caregiver support      Co-evaluation               AM-PAC PT "6 Clicks" Mobility  Outcome Measure Help needed turning from your back to your side while in a flat bed without using bedrails?: A Little Help needed moving from lying on your back to sitting on the side of a flat bed without using bedrails?: A Little Help needed moving to and from a bed to a chair (including a wheelchair)?: A Little Help needed standing up from a chair using your arms (e.g., wheelchair or bedside chair)?: A Little Help needed to walk in hospital room?: A Little Help needed climbing 3-5 steps with a railing? : A  Lot 6 Click Score: 17    End of Session Equipment Utilized During Treatment: Gait belt Activity Tolerance: Patient tolerated treatment well Patient left: in bed;with call bell/phone within reach;with bed alarm set(fall pad in place; all bed rails up) Nurse Communication: Mobility status PT Visit Diagnosis: Unsteadiness on feet (R26.81)    Time: 1210-1250 PT Time Calculation (min) (ACUTE ONLY): 40 min   Charges:   PT Evaluation $PT Eval Moderate Complexity: 1 Mod PT Treatments $Gait Training: 8-22 mins        Royetta Asal, PT Acute Rehab Services Pager [phone] [facility] [phone] [facility] [phone]   Rayetta Humphrey 01/11/2020, 12:59 PM

## 2020-01-11 NOTE — Progress Notes (Signed)
Patient became very agitated and aggressive towards staff as she wanted to get out of bed and leave to go home. After multiple attempts to explain to patient that we are here to keep her safe, she only became more aggressive and upset. She attempted to kick and hit staff. Dr. Jonathon Bellows was paged and 5mg  IM Zyprexa was ordered and given. Security was called to bedside to try and help calm patient down. Multiple phone calls were made to patient's POA's and [name]) who helped by speaking with patient via telephone and asking patients friend Terrie to come to bedside and sit with with patient. MD came to bedside to assist with de-escalating patient.  [name] has planned to come visit around 1530 today and MD has agreed to come to bedside and speak with her then. Patient is in bed at this time with friend Terrie sitting at bedside.

## 2020-01-11 NOTE — Progress Notes (Signed)
Patient's HR briefly dropped to 39 on the monitor before returning to SB. When assessed by this RN, pt was found to be alert and asymptomatic. HR is currently 55 bpm, BP WNL. [facility] on-call paged by this RN; awaiting further orders at this time. RN will continue to carefully monitor pt.

## 2020-01-11 NOTE — Telephone Encounter (Signed)
Patient's daughter called for a status update on the letter.

## 2020-01-11 NOTE — Progress Notes (Addendum)
Patient's HR dropped to 32 bpm on the monitor while resting. When this RN assessed pt, HR remained in the 30's while pt was awake and conversing. Pt appeared to be asymptomatic, BP WNL. HR currently SB, 59 bpm.[facility] on-call paged by this RN; orders received to continue to closely monitor pt for now.

## 2020-01-12 ENCOUNTER — Telehealth: Payer: Self-pay | Admitting: Cardiology

## 2020-01-12 ENCOUNTER — Encounter: Payer: Self-pay | Admitting: Neurology

## 2020-01-12 NOTE — Telephone Encounter (Signed)
[name]) is aware - OK for caregiver Barth Kirks) to come to appt with pt tomorrow.  Notes placed in appt notes.

## 2020-01-12 NOTE — TOC Transition Note (Signed)
Transition of Care [facility]) - CM/SW Discharge Note   Patient Details  Name: [name] MRN: [id number] Date of Birth: 1946-05-17  Transition of [facility]) CM/SW Contact:  Golda Acre, RN Phone Number: 01/12/2020, 7:46 AM   Clinical Narrative:    rec'd call late on [id number]/pt and legal guardian wanting to go homer/tct-legal guardian am of [phone]-instructed that I am attempting to find a hhc agency that can take insurance and perform care.  Instructed [name] to call me back if no contact from a hhc -hopefully kah calls to arrange care.   Final next level of care: Home w Home Health Services Barriers to Discharge: Barriers Resolved   Patient Goals and CMS Choice Patient states their goals for this hospitalization and ongoing recovery are:: legal; guardian i want to take her home CMS Medicare.gov Compare Post Acute Care list provided to:: Legal Guardian Choice offered to / list presented to : [facility] POA / Guardian  Discharge Placement                       Discharge Plan and Services   Discharge Planning Services: CM Consult Post Acute Care Choice: Home Health                    HH Arranged: RN, PT, OT, Nurse's Aide, Social Work [employer] Agency: [facility] [employer] Date HH Agency Contacted: 01/12/20 Time HH Agency Contacted: 0745 Representative spoke with at [facility] Agency: mike  Social Determinants of Health (SDOH) Interventions     Readmission Risk Interventions No flowsheet data found.

## 2020-01-12 NOTE — Telephone Encounter (Signed)
° °  Pt's POA called, pt need f/u with Dr. Anne Fu due to she overdosed her heart meds diltiazem and eliquis and the believe its 7 - 10 days worth of pills. She said pt's care giver need to come in with her on her appt tomorrow.  Please advise

## 2020-01-12 NOTE — Telephone Encounter (Signed)
See other phone note

## 2020-01-12 NOTE — Telephone Encounter (Signed)
Letter done, thanks.

## 2020-01-12 NOTE — Telephone Encounter (Signed)
Spoke to pt daughter she stated that pt is now taken a whole tablet of the Donepezil started whole pill Friday, informed that Dr Karel Jarvis would like to give the medication some time to work before adding a new medication,  Pt daughter stated that she was a nursing facility looking into it for her mom placement after she got into her medication, she stated that her moms medication will not be left in the home,  Pt daughter was informed that jury duty letter was ready and that it was placed in the mail, to address on file,  Pt  Daughter asking about paper work that was sent over form the [place]?

## 2020-01-12 NOTE — Telephone Encounter (Signed)
Is she still taking 1/2 tab daily or have they increased to 1 tab daily? That is the highest dose I would go on the Donepezil. This was just recently started, would give some time to get into system before adding any other medications. At this point, medications will have to be out of her home with caregiver bringing in meds for administration.

## 2020-01-13 ENCOUNTER — Encounter: Payer: Self-pay | Admitting: Cardiology

## 2020-01-13 ENCOUNTER — Other Ambulatory Visit: Payer: Self-pay

## 2020-01-13 ENCOUNTER — Ambulatory Visit: Payer: Medicare Other | Admitting: Cardiology

## 2020-01-13 VITALS — BP 100/70 | HR 82 | Ht 67.0 in | Wt 142.6 lb

## 2020-01-13 DIAGNOSIS — R600 Localized edema: Secondary | ICD-10-CM

## 2020-01-13 DIAGNOSIS — I480 Paroxysmal atrial fibrillation: Secondary | ICD-10-CM | POA: Diagnosis not present

## 2020-01-13 DIAGNOSIS — Z7901 Long term (current) use of anticoagulants: Secondary | ICD-10-CM

## 2020-01-13 NOTE — Patient Instructions (Signed)
Medication Instructions:  The current medical regimen is effective;  continue present plan and medications.  *If you need a refill on your cardiac medications before your next appointment, please call your pharmacy*  Follow-Up: At [facility], you and your health needs are our priority.  As part of our continuing mission to provide you with exceptional heart care, we have created designated Provider Care Teams.  These Care Teams include your primary Cardiologist (physician) and Advanced Practice Providers (APPs -  Physician Assistants and Nurse Practitioners) who all work together to provide you with the care you need, when you need it.  We recommend signing up for the patient portal called "MyChart".  Sign up information is provided on this After Visit Summary.  MyChart is used to connect with patients for Virtual Visits (Telemedicine).  Patients are able to view lab/test results, encounter notes, upcoming appointments, etc.  Non-urgent messages can be sent to your provider as well.   To learn more about what you can do with MyChart, go to [email or web address].    Your next appointment:   6 month(s)  The format for your next appointment:   In Person  Provider:   You may see Mark Skains, MD or one of the following Advanced Practice Providers on your designated Care Team:    Lori Gerhardt, NP  Laura Ingold, NP  Jill McDaniel, NP   Thank you for choosing [facility]!!     
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
## 2020-01-15 ENCOUNTER — Other Ambulatory Visit: Payer: Self-pay | Admitting: Neurology

## 2020-01-21 NOTE — Telephone Encounter (Signed)
Pt daughter called no answer no voice mail, [employer] papers placed in the mail

## 2020-01-21 NOTE — Telephone Encounter (Signed)
[employer] form filled out. Thanks

## 2020-01-25 ENCOUNTER — Ambulatory Visit
Admission: RE | Admit: 2020-01-25 | Discharge: 2020-01-25 | Disposition: A | Discharge status: Home / Self Care | Payer: Medicare Other | Source: Ambulatory Visit | Attending: Neurology | Admitting: Neurology

## 2020-01-25 ENCOUNTER — Other Ambulatory Visit: Payer: Self-pay

## 2020-01-25 DIAGNOSIS — F0390 Unspecified dementia without behavioral disturbance: Secondary | ICD-10-CM

## 2020-01-25 DIAGNOSIS — F03A0 Unspecified dementia, mild, without behavioral disturbance, psychotic disturbance, mood disturbance, and anxiety: Secondary | ICD-10-CM

## 2020-01-26 ENCOUNTER — Telehealth: Payer: Self-pay

## 2020-01-26 NOTE — Telephone Encounter (Signed)
Pt POA called no answer unable to leave a voice mail will call again later

## 2020-01-26 NOTE — Telephone Encounter (Signed)
-----   Message from Van Clines, MD sent at 01/26/2020  1:02 PM EDT ----- Pls let her POA know that the MRI brain did not show any evidence of tumor, stroke, or bleed. It showed brain shrinkage, particularly in areas of the brain that are seen with Alzheimer's disease. Thanks.

## 2020-02-22 ENCOUNTER — Other Ambulatory Visit: Payer: Self-pay

## 2020-02-22 ENCOUNTER — Ambulatory Visit: Payer: Medicare Other | Admitting: Podiatry

## 2020-02-22 ENCOUNTER — Encounter: Payer: Self-pay | Admitting: Podiatry

## 2020-02-22 DIAGNOSIS — B351 Tinea unguium: Secondary | ICD-10-CM

## 2020-02-22 DIAGNOSIS — M79676 Pain in unspecified toe(s): Secondary | ICD-10-CM | POA: Diagnosis not present

## 2020-02-22 DIAGNOSIS — L84 Corns and callosities: Secondary | ICD-10-CM | POA: Insufficient documentation

## 2020-02-22 NOTE — Progress Notes (Signed)
This patient returns to my office for at risk foot care.  This patient requires this care by a professional since this patient will be at risk due to having coagulation defect and neuropathy.  This patient is unable to cut nails herself since the patient cannot reach her nails.These nails are painful walking and wearing shoes.   Patient also has painful callus fifth toe right foot. This patient presents for at risk foot care today.  General Appearance  Alert, conversant and in no acute stress.  Vascular  Dorsalis pedis and posterior tibial  pulses are weakly  palpable due to swelling bilaterally.  Capillary return is within normal limits  bilaterally. Temperature is within normal limits  Bilaterally. Venous stasis  B/L.  Neurologic  Senn-Weinstein monofilament wire test within normal limits  bilaterally. Muscle power within normal limits bilaterally.  Nails Thick disfigured discolored nails with subungual debris  from hallux to fifth toes bilaterally. No evidence of bacterial infection or drainage bilaterally.  Orthopedic  No limitations of motion  feet .  No crepitus or effusions noted.  No bony pathology or digital deformities noted.  Skin  normotropic skin with no porokeratosis noted bilaterally.  No signs of infections or ulcers noted.     Onychomycosis  Pain in right toes  Pain in left toes  Corn 5th toe right  Consent was obtained for treatment procedures.   Mechanical debridement of nails 1-5  bilaterally performed with a nail nipper.  Filed with dremel without incident. Debride corn with # 15 blade.   Return office visit   4 months                   Told patient to return for periodic foot care and evaluation due to potential at risk complications.   Helane Gunther DPM

## 2020-06-28 ENCOUNTER — Encounter: Payer: Self-pay | Admitting: Podiatry

## 2020-06-28 ENCOUNTER — Other Ambulatory Visit: Payer: Self-pay

## 2020-06-28 ENCOUNTER — Ambulatory Visit (INDEPENDENT_AMBULATORY_CARE_PROVIDER_SITE_OTHER): Payer: Medicare (Managed Care) | Admitting: Podiatry

## 2020-06-28 DIAGNOSIS — G629 Polyneuropathy, unspecified: Secondary | ICD-10-CM

## 2020-06-28 DIAGNOSIS — M79676 Pain in unspecified toe(s): Secondary | ICD-10-CM

## 2020-06-28 DIAGNOSIS — R609 Edema, unspecified: Secondary | ICD-10-CM | POA: Diagnosis not present

## 2020-06-28 DIAGNOSIS — Z7901 Long term (current) use of anticoagulants: Secondary | ICD-10-CM | POA: Diagnosis not present

## 2020-06-28 DIAGNOSIS — B351 Tinea unguium: Secondary | ICD-10-CM

## 2020-06-28 NOTE — Progress Notes (Signed)
This patient returns to my office for at risk foot care.  This patient requires this care by a professional since this patient will be at risk due to having coagulation defect and neuropathy.  This patient is unable to cut nails herself since the patient cannot reach her nails.These nails are painful walking and wearing shoes.   Patient also has painful callus fifth toe right foot. This patient presents for at risk foot care today.  General Appearance  Alert, conversant and in no acute stress.  Vascular  Dorsalis pedis and posterior tibial  pulses are weakly  palpable due to swelling bilaterally.  Capillary return is within normal limits  bilaterally. Temperature is within normal limits  Bilaterally. Venous stasis  B/L.  Neurologic  Senn-Weinstein monofilament wire test within normal limits  bilaterally. Muscle power within normal limits bilaterally.  Nails Thick disfigured discolored nails with subungual debris  from hallux to fifth toes bilaterally. No evidence of bacterial infection or drainage bilaterally.  Orthopedic  No limitations of motion  feet .  No crepitus or effusions noted.  No bony pathology or digital deformities noted.  Skin  normotropic skin with no porokeratosis noted bilaterally.  No signs of infections or ulcers noted.     Onychomycosis  Pain in right toes  Pain in left toes  Corn 5th toe right  Consent was obtained for treatment procedures.   Mechanical debridement of nails 1-5  bilaterally performed with a nail nipper.  Filed with dremel without incident. Debride corn with # 15 blade.   Return office visit   4 months                   Told patient to return for periodic foot care and evaluation due to potential at risk complications.   Helane Gunther DPM

## 2020-07-05 ENCOUNTER — Encounter: Payer: Self-pay | Admitting: Neurology

## 2020-07-05 ENCOUNTER — Ambulatory Visit (INDEPENDENT_AMBULATORY_CARE_PROVIDER_SITE_OTHER): Payer: Medicare (Managed Care) | Admitting: Neurology

## 2020-07-05 ENCOUNTER — Other Ambulatory Visit: Payer: Self-pay

## 2020-07-05 VITALS — BP 142/77 | HR 64 | Ht 67.0 in | Wt 147.0 lb

## 2020-07-05 DIAGNOSIS — F0281 Dementia in other diseases classified elsewhere with behavioral disturbance: Secondary | ICD-10-CM

## 2020-07-05 DIAGNOSIS — G301 Alzheimer's disease with late onset: Secondary | ICD-10-CM | POA: Diagnosis not present

## 2020-07-05 MED ORDER — QUETIAPINE FUMARATE 25 MG PO TABS: ORAL_TABLET | ORAL | 4 refills | Status: DC

## 2020-07-05 MED ORDER — DONEPEZIL HCL 10 MG PO TABS: ORAL_TABLET | ORAL | 3 refills | Status: AC
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
## 2020-07-05 NOTE — Patient Instructions (Signed)
Good to see you! Continue all your medications. Continue 24/7 supervision. Follow-up in 6-8 months, call for any changes
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
## 2020-08-31 ENCOUNTER — Other Ambulatory Visit: Payer: Self-pay

## 2020-08-31 ENCOUNTER — Encounter: Payer: Self-pay | Admitting: Cardiology

## 2020-08-31 ENCOUNTER — Ambulatory Visit: Payer: Medicare (Managed Care) | Admitting: Cardiology

## 2020-08-31 VITALS — BP 124/77 | HR 70 | Ht 67.0 in | Wt 157.0 lb

## 2020-08-31 DIAGNOSIS — Z7901 Long term (current) use of anticoagulants: Secondary | ICD-10-CM | POA: Diagnosis not present

## 2020-08-31 DIAGNOSIS — R600 Localized edema: Secondary | ICD-10-CM

## 2020-08-31 DIAGNOSIS — I480 Paroxysmal atrial fibrillation: Secondary | ICD-10-CM

## 2020-08-31 LAB — CBC
Hematocrit: 41.4 % (ref 34.0–46.6)
Hemoglobin: 13.8 g/dL (ref 11.1–15.9)
MCH: 29.6 pg (ref 26.6–33.0)
MCHC: 33.3 g/dL (ref 31.5–35.7)
MCV: 89 fL (ref 79–97)
Platelets: 247 10*3/uL (ref 150–450)
RBC: 4.66 x10E6/uL (ref 3.77–5.28)
RDW: 13.6 % (ref 11.7–15.4)
WBC: 6.2 10*3/uL (ref 3.4–10.8)

## 2020-08-31 NOTE — Patient Instructions (Addendum)
Medication Instructions:  Your physician recommends that you continue on your current medications as directed. Please refer to the Current Medication list given to you today.  *If you need a refill on your cardiac medications before your next appointment, please call your pharmacy*   Lab Work: TODAY: CBC If you have labs (blood work) drawn today and your tests are completely normal, you will receive your results only by: Marland Kitchen MyChart Message (if you have MyChart) OR . A paper copy in the mail If you have any lab test that is abnormal or we need to change your treatment, we will call you to review the results.   Testing/Procedures: NONE   Follow-Up: At [facility], you and your health needs are our priority.  As part of our continuing mission to provide you with exceptional heart care, we have created designated Provider Care Teams.  These Care Teams include your primary Cardiologist (physician) and Advanced Practice Providers (APPs -  Physician Assistants and Nurse Practitioners) who all work together to provide you with the care you need, when you need it.  We recommend signing up for the patient portal called "MyChart".  Sign up information is provided on this After Visit Summary.  MyChart is used to connect with patients for Virtual Visits (Telemedicine).  Patients are able to view lab/test results, encounter notes, upcoming appointments, etc.  Non-urgent messages can be sent to your provider as well.   To learn more about what you can do with MyChart, go to [email or web address].    Your next appointment:   12 month(s)  The format for your next appointment:   In Person  Provider:   You may see Donato Schultz, MD or one of the following Advanced Practice Providers on your designated Care Team:    Nada Boozer, NP  Georgie Chard, NP

## 2020-11-01 ENCOUNTER — Ambulatory Visit: Payer: Medicare (Managed Care) | Admitting: Podiatry

## 2020-11-01 ENCOUNTER — Encounter: Payer: Self-pay | Admitting: Podiatry

## 2020-11-01 ENCOUNTER — Other Ambulatory Visit: Payer: Self-pay

## 2020-11-01 DIAGNOSIS — B351 Tinea unguium: Secondary | ICD-10-CM | POA: Diagnosis not present

## 2020-11-01 DIAGNOSIS — R609 Edema, unspecified: Secondary | ICD-10-CM

## 2020-11-01 DIAGNOSIS — Z87442 Personal history of urinary calculi: Secondary | ICD-10-CM | POA: Insufficient documentation

## 2020-11-01 DIAGNOSIS — Z7901 Long term (current) use of anticoagulants: Secondary | ICD-10-CM

## 2020-11-01 DIAGNOSIS — R413 Other amnesia: Secondary | ICD-10-CM | POA: Insufficient documentation

## 2020-11-01 DIAGNOSIS — E559 Vitamin D deficiency, unspecified: Secondary | ICD-10-CM | POA: Insufficient documentation

## 2020-11-01 DIAGNOSIS — I890 Lymphedema, not elsewhere classified: Secondary | ICD-10-CM | POA: Insufficient documentation

## 2020-11-01 DIAGNOSIS — R7301 Impaired fasting glucose: Secondary | ICD-10-CM | POA: Insufficient documentation

## 2020-11-01 DIAGNOSIS — M79676 Pain in unspecified toe(s): Secondary | ICD-10-CM

## 2020-11-01 DIAGNOSIS — G629 Polyneuropathy, unspecified: Secondary | ICD-10-CM

## 2020-11-01 DIAGNOSIS — E782 Mixed hyperlipidemia: Secondary | ICD-10-CM | POA: Insufficient documentation

## 2020-11-01 DIAGNOSIS — F419 Anxiety disorder, unspecified: Secondary | ICD-10-CM | POA: Insufficient documentation

## 2020-11-01 DIAGNOSIS — N950 Postmenopausal bleeding: Secondary | ICD-10-CM | POA: Insufficient documentation

## 2020-11-01 NOTE — Progress Notes (Addendum)
This patient returns to my office for at risk foot care.  This patient requires this care by a professional since this patient will be at risk due to having coagulation defect and neuropathy.  This patient is unable to cut nails herself since the patient cannot reach her nails.These nails are painful walking and wearing shoes. Patient presents with female caregiver.   This patient presents for at risk foot care today.  General Appearance  Alert, conversant and in no acute stress.  Vascular  Dorsalis pedis and posterior tibial  pulses are weakly  palpable due to swelling bilaterally.  Capillary return is within normal limits  bilaterally. Temperature is within normal limits  Bilaterally. Venous stasis  B/L.  Neurologic  Senn-Weinstein monofilament wire test within normal limits  bilaterally. Muscle power within normal limits bilaterally.  Nails Thick disfigured discolored nails with subungual debris  from hallux to fifth toes bilaterally. No evidence of bacterial infection or drainage bilaterally.  Orthopedic  No limitations of motion  feet .  No crepitus or effusions noted.  No bony pathology or digital deformities noted.  Skin  normotropic skin with no porokeratosis noted bilaterally.  No signs of infections or ulcers noted.   Asymptomatic  HD 5th right.  Onychomycosis  Pain in right toes  Pain in left toes    Consent was obtained for treatment procedures.   Mechanical debridement of nails 1-5  bilaterally performed with a nail nipper.  Filed with dremel without incident. Debride corn with # 15 blade.   Return office visit   4 months                   Told patient to return for periodic foot care and evaluation due to potential at risk complications.   Helane Gunther DPM

## 2021-02-02 ENCOUNTER — Encounter: Payer: Self-pay | Admitting: Family

## 2021-02-02 ENCOUNTER — Other Ambulatory Visit: Payer: Self-pay

## 2021-02-02 ENCOUNTER — Telehealth: Payer: Self-pay | Admitting: Family

## 2021-02-02 ENCOUNTER — Ambulatory Visit: Payer: Medicare (Managed Care) | Admitting: Family

## 2021-02-02 VITALS — BP 100/70 | HR 81 | Ht 67.0 in | Wt 142.0 lb

## 2021-02-02 DIAGNOSIS — I480 Paroxysmal atrial fibrillation: Secondary | ICD-10-CM | POA: Diagnosis not present

## 2021-02-02 DIAGNOSIS — Z7901 Long term (current) use of anticoagulants: Secondary | ICD-10-CM | POA: Diagnosis not present

## 2021-02-02 NOTE — Telephone Encounter (Signed)
Called and left a detail message advising of no need to Amiodarone therapy at this time per Gillian Shields, NP.

## 2021-02-02 NOTE — Telephone Encounter (Signed)
   Pt c/o medication issue:  1. Name of Medication: amiodarone (PACERONE) 400 MG tablet  2. How are you currently taking this medication (dosage and times per day)? Patient is NOT taking per Chip Boer on [employer] in [place]  3. Are you having a reaction (difficulty breathing--STAT)? no  4. What is your medication issue? [name] brought this patient in for a visit today and was told to call back as to whether or not the patient is taking this medication.    Please reach out to [facility] if necessary. She is the patient's POA

## 2021-02-02 NOTE — Patient Instructions (Addendum)
Medication Instructions:  Continue your current medications.   Please ask Chip Boer if you are taking Amiodarone (Pacerone) and let us know if you are taking that medication.   *If you need a refill on your cardiac medications before your next appointment, please call your pharmacy*   Lab Work: Your physician recommends  lab work today: BMP, CBC  If you have labs (blood work) drawn today and your tests are completely normal, you will receive your results only by: MyChart Message (if you have MyChart) OR A paper copy in the mail If you have any lab test that is abnormal or we need to change your treatment, we will call you to review the results.   Testing/Procedures: Your EKG today showed normal sinus rhythm.   Follow-Up: At [facility], you and your health needs are our priority.  As part of our continuing mission to provide you with exceptional heart care, we have created designated Provider Care Teams.  These Care Teams include your primary Cardiologist (physician) and Advanced Practice Providers (APPs -  Physician Assistants and Nurse Practitioners) who all work together to provide you with the care you need, when you need it.  We recommend signing up for the patient portal called "MyChart".  Sign up information is provided on this After Visit Summary.  MyChart is used to connect with patients for Virtual Visits (Telemedicine).  Patients are able to view lab/test results, encounter notes, upcoming appointments, etc.  Non-urgent messages can be sent to your provider as well.   To learn more about what you can do with MyChart, go to [email or web address].    Your next appointment:   6 month(s)  The format for your next appointment:   In Person  Provider:   You may see Donato Schultz, MD or one of the following Advanced Practice Providers on your designated Care Team:   Georgie Chard, NP  Other Instructions  Heart Healthy Diet Recommendations: A low-salt diet is recommended.  Meats should be grilled, baked, or boiled. Avoid fried foods. Focus on lean protein sources like fish or chicken with vegetables and fruits. The American Heart Association is a Chief Technology Officer!  American Heart Association Diet and Lifeystyle Recommendations   Exercise recommendations: The American Heart Association recommends 150 minutes of moderate intensity exercise weekly. Try 30 minutes of moderate intensity exercise 4-5 times per week. This could include walking, jogging, or swimming.

## 2021-02-02 NOTE — Telephone Encounter (Signed)
As [name] was maintaining normal sinus rhythm by EKG in clinic visit today and has no palpitations, no indication for Amiodarone therapy at this time. Please contact [name] (okay per DPR) to make her aware.  Alver Sorrow, NP
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
## 2021-02-03 ENCOUNTER — Encounter: Payer: Self-pay | Admitting: Family

## 2021-02-03 LAB — BASIC METABOLIC PANEL
BUN/Creatinine Ratio: 11 — ABNORMAL LOW (ref 12–28)
BUN: 14 mg/dL (ref 8–27)
CO2: 19 mmol/L — ABNORMAL LOW (ref 20–29)
Calcium: 9.7 mg/dL (ref 8.7–10.3)
Chloride: 104 mmol/L (ref 96–106)
Creatinine, Ser: 1.22 mg/dL — ABNORMAL HIGH (ref 0.57–1.00)
Glucose: 99 mg/dL (ref 65–99)
Potassium: 4 mmol/L (ref 3.5–5.2)
Sodium: 143 mmol/L (ref 134–144)
eGFR: 46 mL/min/{1.73_m2} — ABNORMAL LOW (ref >59)

## 2021-02-03 LAB — CBC
Hematocrit: 40.9 % (ref 34.0–46.6)
Hemoglobin: 13.3 g/dL (ref 11.1–15.9)
MCH: 28.9 pg (ref 26.6–33.0)
MCHC: 32.5 g/dL (ref 31.5–35.7)
MCV: 89 fL (ref 79–97)
Platelets: 335 10*3/uL (ref 150–450)
RBC: 4.6 x10E6/uL (ref 3.77–5.28)
RDW: 14.1 % (ref 11.7–15.4)
WBC: 7.9 10*3/uL (ref 3.4–10.8)

## 2021-02-05 ENCOUNTER — Telehealth: Payer: Self-pay

## 2021-02-05 DIAGNOSIS — I480 Paroxysmal atrial fibrillation: Secondary | ICD-10-CM

## 2021-02-05 DIAGNOSIS — R600 Localized edema: Secondary | ICD-10-CM

## 2021-02-05 NOTE — Telephone Encounter (Signed)
-----   Message from Alver Sorrow, NP sent at 02/03/2021  2:47 PM EDT ----- CBC with no evidence of anemia nor infection. Kidney function shows evidence of dehydration - recommend increasing fluid intake with repeat BMP in 4 weeks for monitoring. Normal electrolytes. Good result!

## 2021-02-05 NOTE — Telephone Encounter (Signed)
Call placed to number on file.  Call went to POA.  Advised of lab results and recommendations.  POA requested this nurse call care facility to advise.  Will call.  Outreach  made to care facility.  They requested recommendations and orders be faxed to their facility-[phone].  Will fax

## 2021-02-09 ENCOUNTER — Ambulatory Visit: Payer: Medicare (Managed Care) | Admitting: Family

## 2021-03-07 ENCOUNTER — Ambulatory Visit: Payer: Medicare (Managed Care) | Admitting: Podiatry

## 2021-03-13 ENCOUNTER — Other Ambulatory Visit: Payer: Self-pay

## 2021-03-13 ENCOUNTER — Ambulatory Visit: Payer: Medicare (Managed Care) | Admitting: Neurology

## 2021-03-13 ENCOUNTER — Encounter: Payer: Self-pay | Admitting: Neurology

## 2021-03-13 VITALS — BP 101/66 | HR 66 | Wt 142.0 lb

## 2021-03-13 DIAGNOSIS — G301 Alzheimer's disease with late onset: Secondary | ICD-10-CM

## 2021-03-13 DIAGNOSIS — F0281 Dementia in other diseases classified elsewhere with behavioral disturbance: Secondary | ICD-10-CM

## 2021-03-13 NOTE — Patient Instructions (Signed)
Hope you continue feeling better and better. Continue all your medications. Increase physical activity and brain stimulation activities. Follow-up in 6 months, call for any changes.   FALL PRECAUTIONS: Be cautious when walking. Scan the area for obstacles that may increase the risk of trips and falls. When getting up in the mornings, sit up at the edge of the bed for a few minutes before getting out of bed. Consider elevating the bed at the head end to avoid drop of blood pressure when getting up. Walk always in a well-lit room (use night lights in the walls). Avoid area rugs or power cords from appliances in the middle of the walkways. Use a walker or a cane if necessary and consider physical therapy for balance exercise. Get your eyesight checked regularly.   RECOMMENDATIONS FOR ALL PATIENTS WITH MEMORY PROBLEMS: 1. Continue to exercise (Recommend 30 minutes of walking everyday, or 3 hours every week) 2. Increase social interactions - continue going to [place] and enjoy social gatherings with friends and family 3. Eat healthy, avoid fried foods and eat more fruits and vegetables 4. Maintain adequate blood pressure, blood sugar, and blood cholesterol level. Reducing the risk of stroke and cardiovascular disease also helps promoting better memory. 5. Avoid stressful situations. Live a simple life and avoid aggravations. Organize your time and prepare for the next day in anticipation. 6. Sleep well, avoid any interruptions of sleep and avoid any distractions in the bedroom that may interfere with adequate sleep quality 7. Avoid sugar, avoid sweets as there is a strong link between excessive sugar intake, diabetes, and cognitive impairment The Mediterranean diet has been shown to help patients reduce the risk of progressive memory disorders and reduces cardiovascular risk. This includes eating fish, eat fruits and green leafy vegetables, nuts like almonds and hazelnuts, walnuts, and also use olive oil.  Avoid fast foods and fried foods as much as possible. Avoid sweets and sugar as sugar use has been linked to worsening of memory function.  There is always a concern of gradual progression of memory problems. If this is the case, then we may need to adjust level of care according to patient needs. Support, both to the patient and caregiver, should then be put into place.
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
## 2021-04-29 ENCOUNTER — Other Ambulatory Visit: Payer: Self-pay

## 2021-04-29 ENCOUNTER — Emergency Department (HOSPITAL_BASED_OUTPATIENT_CLINIC_OR_DEPARTMENT_OTHER)
Admission: EM | Admit: 2021-04-29 | Discharge: 2021-04-29 | Disposition: A | Discharge status: Home / Self Care | Payer: Medicare (Managed Care) | Attending: Emergency Medicine | Admitting: Emergency Medicine

## 2021-04-29 ENCOUNTER — Encounter (HOSPITAL_BASED_OUTPATIENT_CLINIC_OR_DEPARTMENT_OTHER): Payer: Self-pay

## 2021-04-29 DIAGNOSIS — I4891 Unspecified atrial fibrillation: Secondary | ICD-10-CM | POA: Insufficient documentation

## 2021-04-29 DIAGNOSIS — Z7901 Long term (current) use of anticoagulants: Secondary | ICD-10-CM | POA: Insufficient documentation

## 2021-04-29 DIAGNOSIS — R220 Localized swelling, mass and lump, head: Secondary | ICD-10-CM | POA: Diagnosis not present

## 2021-04-29 DIAGNOSIS — F0390 Unspecified dementia without behavioral disturbance: Secondary | ICD-10-CM | POA: Diagnosis not present

## 2021-04-29 MED ORDER — DIPHENHYDRAMINE HCL 25 MG PO CAPS
25.0000 mg | ORAL_CAPSULE | Freq: Once | ORAL | Status: AC
  Administered 2021-04-29: 25 mg via ORAL
  Filled 2021-04-29: qty 1

## 2021-04-29 MED ORDER — PREDNISONE 20 MG PO TABS
20.0000 mg | ORAL_TABLET | Freq: Once | ORAL | Status: AC
  Administered 2021-04-29: 20 mg via ORAL
  Filled 2021-04-29: qty 1

## 2021-04-29 MED ORDER — PREDNISONE 10 MG PO TABS: 20.0000 mg | ORAL_TABLET | Freq: Two times a day (BID) | ORAL | 0 refills | Status: AC

## 2021-04-29 NOTE — ED Triage Notes (Addendum)
Pt arrives EMS from [place] with c/o swollen and chapped lips x 1 month.

## 2021-04-29 NOTE — ED Notes (Signed)
Unable to reach staff at [facility] for report.   [facility] contacted for pt transfer back to facility.

## 2021-04-29 NOTE — Discharge Instructions (Signed)
Begin taking prednisone as prescribed.  Take Benadryl 25 mg 3 times daily for the next several days.  This medication is available over-the-counter.  Apply Chapstick several times daily to prevent cracking and drying of your lips.
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
## 2021-09-13 ENCOUNTER — Ambulatory Visit: Payer: Medicare (Managed Care) | Admitting: Physician Assistant

## 2022-03-13 ENCOUNTER — Ambulatory Visit: Payer: Medicare (Managed Care) | Admitting: Neurology

## 2022-08-16 DIAGNOSIS — R634 Abnormal weight loss: Secondary | ICD-10-CM | POA: Diagnosis not present

## 2022-08-16 DIAGNOSIS — F0390 Unspecified dementia without behavioral disturbance: Secondary | ICD-10-CM | POA: Diagnosis not present

## 2022-08-16 DIAGNOSIS — R600 Localized edema: Secondary | ICD-10-CM | POA: Diagnosis not present

## 2022-10-02 DIAGNOSIS — L853 Xerosis cutis: Secondary | ICD-10-CM

## 2022-10-02 DIAGNOSIS — F0390 Unspecified dementia without behavioral disturbance: Secondary | ICD-10-CM | POA: Diagnosis not present

## 2022-10-07 DIAGNOSIS — M6281 Muscle weakness (generalized): Secondary | ICD-10-CM

## 2022-10-07 DIAGNOSIS — W19XXXA Unspecified fall, initial encounter: Secondary | ICD-10-CM

## 2022-10-07 DIAGNOSIS — F0390 Unspecified dementia without behavioral disturbance: Secondary | ICD-10-CM

## 2022-10-07 DIAGNOSIS — R2689 Other abnormalities of gait and mobility: Secondary | ICD-10-CM

## 2022-10-07 DIAGNOSIS — Z9181 History of falling: Secondary | ICD-10-CM

## 2022-10-07 DIAGNOSIS — S0083XA Contusion of other part of head, initial encounter: Secondary | ICD-10-CM

## 2022-10-07 DIAGNOSIS — R269 Unspecified abnormalities of gait and mobility: Secondary | ICD-10-CM

## 2022-10-10 DIAGNOSIS — R0989 Other specified symptoms and signs involving the circulatory and respiratory systems: Secondary | ICD-10-CM | POA: Diagnosis not present

## 2022-10-10 DIAGNOSIS — F0390 Unspecified dementia without behavioral disturbance: Secondary | ICD-10-CM

## 2022-10-10 DIAGNOSIS — R059 Cough, unspecified: Secondary | ICD-10-CM | POA: Diagnosis not present

## 2022-10-11 DIAGNOSIS — R062 Wheezing: Secondary | ICD-10-CM

## 2022-10-11 DIAGNOSIS — R918 Other nonspecific abnormal finding of lung field: Secondary | ICD-10-CM

## 2022-10-11 DIAGNOSIS — F0390 Unspecified dementia without behavioral disturbance: Secondary | ICD-10-CM | POA: Diagnosis not present

## 2022-10-11 DIAGNOSIS — J219 Acute bronchiolitis, unspecified: Secondary | ICD-10-CM | POA: Diagnosis not present

## 2022-10-11 DIAGNOSIS — R059 Cough, unspecified: Secondary | ICD-10-CM | POA: Diagnosis not present

## 2022-10-25 DIAGNOSIS — N1831 Chronic kidney disease, stage 3a: Secondary | ICD-10-CM

## 2022-10-25 DIAGNOSIS — I4891 Unspecified atrial fibrillation: Secondary | ICD-10-CM

## 2022-10-25 DIAGNOSIS — I890 Lymphedema, not elsewhere classified: Secondary | ICD-10-CM | POA: Diagnosis not present

## 2022-10-25 DIAGNOSIS — E559 Vitamin D deficiency, unspecified: Secondary | ICD-10-CM

## 2022-10-25 DIAGNOSIS — M1990 Unspecified osteoarthritis, unspecified site: Secondary | ICD-10-CM

## 2022-10-25 DIAGNOSIS — F0390 Unspecified dementia without behavioral disturbance: Secondary | ICD-10-CM

## 2022-10-25 DIAGNOSIS — E785 Hyperlipidemia, unspecified: Secondary | ICD-10-CM

## 2022-10-25 DIAGNOSIS — I739 Peripheral vascular disease, unspecified: Secondary | ICD-10-CM

## 2022-11-01 DIAGNOSIS — N1832 Chronic kidney disease, stage 3b: Secondary | ICD-10-CM

## 2022-11-01 DIAGNOSIS — F0390 Unspecified dementia without behavioral disturbance: Secondary | ICD-10-CM

## 2022-11-01 DIAGNOSIS — R799 Abnormal finding of blood chemistry, unspecified: Secondary | ICD-10-CM | POA: Diagnosis not present

## 2022-11-06 DIAGNOSIS — Z6821 Body mass index (BMI) 21.0-21.9, adult: Secondary | ICD-10-CM | POA: Diagnosis not present

## 2022-11-06 DIAGNOSIS — F0390 Unspecified dementia without behavioral disturbance: Secondary | ICD-10-CM

## 2022-11-06 DIAGNOSIS — R634 Abnormal weight loss: Secondary | ICD-10-CM

## 2022-11-06 DIAGNOSIS — R4181 Age-related cognitive decline: Secondary | ICD-10-CM | POA: Diagnosis not present

## 2022-11-14 DIAGNOSIS — Z87440 Personal history of urinary (tract) infections: Secondary | ICD-10-CM

## 2022-11-14 DIAGNOSIS — R8290 Unspecified abnormal findings in urine: Secondary | ICD-10-CM

## 2022-11-14 DIAGNOSIS — R4182 Altered mental status, unspecified: Secondary | ICD-10-CM

## 2022-11-20 DIAGNOSIS — N390 Urinary tract infection, site not specified: Secondary | ICD-10-CM

## 2022-11-20 DIAGNOSIS — B9620 Unspecified Escherichia coli [E. coli] as the cause of diseases classified elsewhere: Secondary | ICD-10-CM

## 2022-11-20 DIAGNOSIS — R4182 Altered mental status, unspecified: Secondary | ICD-10-CM

## 2022-11-20 DIAGNOSIS — B961 Klebsiella pneumoniae [K. pneumoniae] as the cause of diseases classified elsewhere: Secondary | ICD-10-CM

## 2022-11-20 DIAGNOSIS — F0390 Unspecified dementia without behavioral disturbance: Secondary | ICD-10-CM

## 2022-11-21 DIAGNOSIS — N390 Urinary tract infection, site not specified: Secondary | ICD-10-CM

## 2022-11-21 DIAGNOSIS — B961 Klebsiella pneumoniae [K. pneumoniae] as the cause of diseases classified elsewhere: Secondary | ICD-10-CM | POA: Diagnosis not present

## 2022-11-21 DIAGNOSIS — B9620 Unspecified Escherichia coli [E. coli] as the cause of diseases classified elsewhere: Secondary | ICD-10-CM

## 2022-11-21 DIAGNOSIS — F0390 Unspecified dementia without behavioral disturbance: Secondary | ICD-10-CM | POA: Diagnosis not present

## 2022-11-21 DIAGNOSIS — Z87440 Personal history of urinary (tract) infections: Secondary | ICD-10-CM

## 2022-12-04 DIAGNOSIS — Z6822 Body mass index (BMI) 22.0-22.9, adult: Secondary | ICD-10-CM | POA: Diagnosis not present

## 2022-12-04 DIAGNOSIS — R634 Abnormal weight loss: Secondary | ICD-10-CM | POA: Diagnosis not present

## 2022-12-04 DIAGNOSIS — R600 Localized edema: Secondary | ICD-10-CM | POA: Diagnosis not present

## 2022-12-04 DIAGNOSIS — F0390 Unspecified dementia without behavioral disturbance: Secondary | ICD-10-CM

## 2022-12-18 DIAGNOSIS — L84 Corns and callosities: Secondary | ICD-10-CM

## 2022-12-18 DIAGNOSIS — F0390 Unspecified dementia without behavioral disturbance: Secondary | ICD-10-CM

## 2022-12-18 DIAGNOSIS — R600 Localized edema: Secondary | ICD-10-CM

## 2022-12-30 DIAGNOSIS — I959 Hypotension, unspecified: Secondary | ICD-10-CM

## 2022-12-30 DIAGNOSIS — I4891 Unspecified atrial fibrillation: Secondary | ICD-10-CM

## 2022-12-30 DIAGNOSIS — F0390 Unspecified dementia without behavioral disturbance: Secondary | ICD-10-CM | POA: Diagnosis not present

## 2023-03-14 DIAGNOSIS — F0390 Unspecified dementia without behavioral disturbance: Secondary | ICD-10-CM

## 2023-03-14 DIAGNOSIS — I739 Peripheral vascular disease, unspecified: Secondary | ICD-10-CM

## 2023-03-14 DIAGNOSIS — N1831 Chronic kidney disease, stage 3a: Secondary | ICD-10-CM

## 2023-03-14 DIAGNOSIS — I890 Lymphedema, not elsewhere classified: Secondary | ICD-10-CM

## 2023-03-14 DIAGNOSIS — E559 Vitamin D deficiency, unspecified: Secondary | ICD-10-CM

## 2023-03-14 DIAGNOSIS — I4891 Unspecified atrial fibrillation: Secondary | ICD-10-CM

## 2023-03-14 DIAGNOSIS — M6281 Muscle weakness (generalized): Secondary | ICD-10-CM

## 2023-03-14 DIAGNOSIS — Z6823 Body mass index (BMI) 23.0-23.9, adult: Secondary | ICD-10-CM

## 2023-03-24 DIAGNOSIS — F0390 Unspecified dementia without behavioral disturbance: Secondary | ICD-10-CM

## 2023-03-24 DIAGNOSIS — R799 Abnormal finding of blood chemistry, unspecified: Secondary | ICD-10-CM

## 2023-03-24 DIAGNOSIS — E538 Deficiency of other specified B group vitamins: Secondary | ICD-10-CM

## 2023-03-24 DIAGNOSIS — N1831 Chronic kidney disease, stage 3a: Secondary | ICD-10-CM

## 2023-03-24 DIAGNOSIS — L304 Erythema intertrigo: Secondary | ICD-10-CM

## 2023-03-24 DIAGNOSIS — S50812A Abrasion of left forearm, initial encounter: Secondary | ICD-10-CM

## 2023-04-02 DIAGNOSIS — S51801A Unspecified open wound of right forearm, initial encounter: Secondary | ICD-10-CM

## 2023-04-02 DIAGNOSIS — F0390 Unspecified dementia without behavioral disturbance: Secondary | ICD-10-CM | POA: Diagnosis not present

## 2023-04-02 DIAGNOSIS — S51802A Unspecified open wound of left forearm, initial encounter: Secondary | ICD-10-CM

## 2023-04-04 DIAGNOSIS — S51801D Unspecified open wound of right forearm, subsequent encounter: Secondary | ICD-10-CM

## 2023-04-04 DIAGNOSIS — S50912D Unspecified superficial injury of left forearm, subsequent encounter: Secondary | ICD-10-CM

## 2023-04-04 DIAGNOSIS — I872 Venous insufficiency (chronic) (peripheral): Secondary | ICD-10-CM

## 2023-04-04 DIAGNOSIS — L97822 Non-pressure chronic ulcer of other part of left lower leg with fat layer exposed: Secondary | ICD-10-CM

## 2023-04-17 DIAGNOSIS — F0390 Unspecified dementia without behavioral disturbance: Secondary | ICD-10-CM

## 2023-04-17 DIAGNOSIS — R600 Localized edema: Secondary | ICD-10-CM

## 2023-04-17 DIAGNOSIS — S81802A Unspecified open wound, left lower leg, initial encounter: Secondary | ICD-10-CM

## 2023-05-01 DIAGNOSIS — F424 Excoriation (skin-picking) disorder: Secondary | ICD-10-CM

## 2023-05-01 DIAGNOSIS — F419 Anxiety disorder, unspecified: Secondary | ICD-10-CM

## 2023-05-01 DIAGNOSIS — F02818 Dementia in other diseases classified elsewhere, unspecified severity, with other behavioral disturbance: Secondary | ICD-10-CM

## 2023-05-07 DIAGNOSIS — E538 Deficiency of other specified B group vitamins: Secondary | ICD-10-CM

## 2023-05-07 DIAGNOSIS — E0590 Thyrotoxicosis, unspecified without thyrotoxic crisis or storm: Secondary | ICD-10-CM | POA: Diagnosis not present

## 2023-05-07 DIAGNOSIS — F0390 Unspecified dementia without behavioral disturbance: Secondary | ICD-10-CM | POA: Diagnosis not present

## 2023-05-12 DIAGNOSIS — F0390 Unspecified dementia without behavioral disturbance: Secondary | ICD-10-CM

## 2023-05-12 DIAGNOSIS — I890 Lymphedema, not elsewhere classified: Secondary | ICD-10-CM

## 2023-05-12 DIAGNOSIS — R634 Abnormal weight loss: Secondary | ICD-10-CM

## 2023-05-12 DIAGNOSIS — Z6823 Body mass index (BMI) 23.0-23.9, adult: Secondary | ICD-10-CM

## 2023-06-03 DIAGNOSIS — I872 Venous insufficiency (chronic) (peripheral): Secondary | ICD-10-CM | POA: Diagnosis not present

## 2023-06-03 DIAGNOSIS — F0394 Unspecified dementia, unspecified severity, with anxiety: Secondary | ICD-10-CM

## 2023-06-03 DIAGNOSIS — S81802D Unspecified open wound, left lower leg, subsequent encounter: Secondary | ICD-10-CM | POA: Diagnosis not present

## 2023-06-03 DIAGNOSIS — I4891 Unspecified atrial fibrillation: Secondary | ICD-10-CM

## 2023-06-06 DIAGNOSIS — F0390 Unspecified dementia without behavioral disturbance: Secondary | ICD-10-CM

## 2023-06-06 DIAGNOSIS — R600 Localized edema: Secondary | ICD-10-CM

## 2023-06-06 DIAGNOSIS — I890 Lymphedema, not elsewhere classified: Secondary | ICD-10-CM | POA: Diagnosis not present

## 2023-06-06 DIAGNOSIS — K130 Diseases of lips: Secondary | ICD-10-CM | POA: Diagnosis not present

## 2023-06-16 DIAGNOSIS — F0390 Unspecified dementia without behavioral disturbance: Secondary | ICD-10-CM

## 2023-06-16 DIAGNOSIS — S0081XA Abrasion of other part of head, initial encounter: Secondary | ICD-10-CM | POA: Diagnosis not present

## 2023-06-16 DIAGNOSIS — M6281 Muscle weakness (generalized): Secondary | ICD-10-CM

## 2023-07-03 DIAGNOSIS — F0390 Unspecified dementia without behavioral disturbance: Secondary | ICD-10-CM | POA: Diagnosis not present

## 2023-07-03 DIAGNOSIS — R600 Localized edema: Secondary | ICD-10-CM | POA: Diagnosis not present

## 2023-07-25 DIAGNOSIS — E559 Vitamin D deficiency, unspecified: Secondary | ICD-10-CM

## 2023-07-25 DIAGNOSIS — I890 Lymphedema, not elsewhere classified: Secondary | ICD-10-CM | POA: Diagnosis not present

## 2023-07-25 DIAGNOSIS — N1831 Chronic kidney disease, stage 3a: Secondary | ICD-10-CM | POA: Diagnosis not present

## 2023-07-25 DIAGNOSIS — M6281 Muscle weakness (generalized): Secondary | ICD-10-CM

## 2023-07-25 DIAGNOSIS — F0390 Unspecified dementia without behavioral disturbance: Secondary | ICD-10-CM

## 2023-07-25 DIAGNOSIS — I739 Peripheral vascular disease, unspecified: Secondary | ICD-10-CM | POA: Diagnosis not present

## 2023-07-25 DIAGNOSIS — F419 Anxiety disorder, unspecified: Secondary | ICD-10-CM

## 2023-07-25 DIAGNOSIS — E538 Deficiency of other specified B group vitamins: Secondary | ICD-10-CM

## 2023-07-25 DIAGNOSIS — I4891 Unspecified atrial fibrillation: Secondary | ICD-10-CM | POA: Diagnosis not present

## 2023-07-31 DIAGNOSIS — F0390 Unspecified dementia without behavioral disturbance: Secondary | ICD-10-CM | POA: Diagnosis not present

## 2023-07-31 DIAGNOSIS — N1831 Chronic kidney disease, stage 3a: Secondary | ICD-10-CM | POA: Diagnosis not present

## 2023-08-02 DIAGNOSIS — I4891 Unspecified atrial fibrillation: Secondary | ICD-10-CM | POA: Diagnosis not present

## 2023-08-02 DIAGNOSIS — I872 Venous insufficiency (chronic) (peripheral): Secondary | ICD-10-CM | POA: Diagnosis not present

## 2023-08-02 DIAGNOSIS — S81802D Unspecified open wound, left lower leg, subsequent encounter: Secondary | ICD-10-CM | POA: Diagnosis not present

## 2023-08-02 DIAGNOSIS — F0394 Unspecified dementia, unspecified severity, with anxiety: Secondary | ICD-10-CM | POA: Diagnosis not present

## 2023-08-29 DIAGNOSIS — S40211A Abrasion of right shoulder, initial encounter: Secondary | ICD-10-CM

## 2023-08-29 DIAGNOSIS — I890 Lymphedema, not elsewhere classified: Secondary | ICD-10-CM | POA: Diagnosis not present

## 2023-08-29 DIAGNOSIS — F0390 Unspecified dementia without behavioral disturbance: Secondary | ICD-10-CM

## 2023-09-10 DIAGNOSIS — M6281 Muscle weakness (generalized): Secondary | ICD-10-CM | POA: Diagnosis not present

## 2023-09-10 DIAGNOSIS — F0390 Unspecified dementia without behavioral disturbance: Secondary | ICD-10-CM | POA: Diagnosis not present

## 2023-09-10 DIAGNOSIS — B351 Tinea unguium: Secondary | ICD-10-CM | POA: Diagnosis not present

## 2023-09-10 DIAGNOSIS — R600 Localized edema: Secondary | ICD-10-CM | POA: Diagnosis not present

## 2023-09-11 DIAGNOSIS — R634 Abnormal weight loss: Secondary | ICD-10-CM | POA: Diagnosis not present

## 2023-09-11 DIAGNOSIS — F0390 Unspecified dementia without behavioral disturbance: Secondary | ICD-10-CM

## 2023-09-12 DIAGNOSIS — K5900 Constipation, unspecified: Secondary | ICD-10-CM | POA: Diagnosis not present

## 2023-09-12 DIAGNOSIS — F0390 Unspecified dementia without behavioral disturbance: Secondary | ICD-10-CM | POA: Diagnosis not present

## 2023-10-01 DIAGNOSIS — I872 Venous insufficiency (chronic) (peripheral): Secondary | ICD-10-CM | POA: Diagnosis not present

## 2023-10-01 DIAGNOSIS — I4891 Unspecified atrial fibrillation: Secondary | ICD-10-CM | POA: Diagnosis not present

## 2023-10-01 DIAGNOSIS — S81802D Unspecified open wound, left lower leg, subsequent encounter: Secondary | ICD-10-CM | POA: Diagnosis not present

## 2023-10-01 DIAGNOSIS — S01401D Unspecified open wound of right cheek and temporomandibular area, subsequent encounter: Secondary | ICD-10-CM | POA: Diagnosis not present

## 2023-10-08 DIAGNOSIS — R634 Abnormal weight loss: Secondary | ICD-10-CM | POA: Diagnosis not present

## 2023-10-08 DIAGNOSIS — R627 Adult failure to thrive: Secondary | ICD-10-CM | POA: Diagnosis not present

## 2023-10-08 DIAGNOSIS — F0390 Unspecified dementia without behavioral disturbance: Secondary | ICD-10-CM | POA: Diagnosis not present

## 2023-11-06 DIAGNOSIS — F0390 Unspecified dementia without behavioral disturbance: Secondary | ICD-10-CM

## 2023-11-06 DIAGNOSIS — B9620 Unspecified Escherichia coli [E. coli] as the cause of diseases classified elsewhere: Secondary | ICD-10-CM

## 2023-11-06 DIAGNOSIS — N390 Urinary tract infection, site not specified: Secondary | ICD-10-CM

## 2023-11-06 DIAGNOSIS — Z87440 Personal history of urinary (tract) infections: Secondary | ICD-10-CM

## 2023-11-10 DIAGNOSIS — R2689 Other abnormalities of gait and mobility: Secondary | ICD-10-CM | POA: Diagnosis not present

## 2023-11-10 DIAGNOSIS — R52 Pain, unspecified: Secondary | ICD-10-CM

## 2023-11-10 DIAGNOSIS — F0390 Unspecified dementia without behavioral disturbance: Secondary | ICD-10-CM | POA: Diagnosis not present

## 2023-11-10 DIAGNOSIS — M25562 Pain in left knee: Secondary | ICD-10-CM | POA: Diagnosis not present

## 2023-11-10 DIAGNOSIS — M6281 Muscle weakness (generalized): Secondary | ICD-10-CM | POA: Diagnosis not present

## 2023-11-10 DIAGNOSIS — Z9181 History of falling: Secondary | ICD-10-CM

## 2023-11-12 DIAGNOSIS — M25562 Pain in left knee: Secondary | ICD-10-CM | POA: Diagnosis not present

## 2023-11-12 DIAGNOSIS — R5381 Other malaise: Secondary | ICD-10-CM | POA: Diagnosis not present

## 2023-11-12 DIAGNOSIS — M6281 Muscle weakness (generalized): Secondary | ICD-10-CM | POA: Diagnosis not present

## 2023-11-12 DIAGNOSIS — F0390 Unspecified dementia without behavioral disturbance: Secondary | ICD-10-CM | POA: Diagnosis not present

## 2023-11-12 DIAGNOSIS — Z9181 History of falling: Secondary | ICD-10-CM
